# Patient Record
Sex: Female | Born: 1945
Health system: Southern US, Community
[De-identification: ages and names within clinical notes are randomized; demographics above are authoritative.]

## PROBLEM LIST (undated history)

## (undated) DIAGNOSIS — G20A1 Parkinson's disease without dyskinesia, without mention of fluctuations: Secondary | ICD-10-CM

## (undated) DIAGNOSIS — G20 Parkinson's disease: Secondary | ICD-10-CM

## (undated) DIAGNOSIS — E782 Mixed hyperlipidemia: Secondary | ICD-10-CM

## (undated) DIAGNOSIS — R32 Unspecified urinary incontinence: Secondary | ICD-10-CM

## (undated) DIAGNOSIS — F419 Anxiety disorder, unspecified: Secondary | ICD-10-CM

## (undated) HISTORY — PX: BREAST BIOPSY: SHX20

## (undated) HISTORY — DX: Unspecified urinary incontinence: R32

## (undated) HISTORY — PX: KNEE SURGERY: SHX244

## (undated) HISTORY — PX: BACK SURGERY: SHX140

## (undated) HISTORY — DX: Parkinson's disease without dyskinesia, without mention of fluctuations: G20.A1

## (undated) HISTORY — DX: Mixed hyperlipidemia: E78.2

## (undated) HISTORY — PX: VAGINAL HYSTERECTOMY: SUR661

## (undated) HISTORY — DX: Anxiety disorder, unspecified: F41.9

## (undated) HISTORY — DX: Parkinson's disease: G20

---

## 2019-07-20 ENCOUNTER — Other Ambulatory Visit: Payer: Self-pay

## 2019-07-20 ENCOUNTER — Encounter: Payer: Self-pay | Admitting: Obstetrics and Gynecology

## 2019-07-20 ENCOUNTER — Telehealth: Payer: Self-pay | Admitting: Obstetrics and Gynecology

## 2019-07-20 ENCOUNTER — Ambulatory Visit (INDEPENDENT_AMBULATORY_CARE_PROVIDER_SITE_OTHER): Payer: Medicare HMO | Admitting: Obstetrics and Gynecology

## 2019-07-20 ENCOUNTER — Other Ambulatory Visit: Payer: Self-pay | Admitting: *Deleted

## 2019-07-20 VITALS — BP 118/68 | HR 67 | Ht 63.0 in | Wt 120.5 lb

## 2019-07-20 DIAGNOSIS — Z4689 Encounter for fitting and adjustment of other specified devices: Secondary | ICD-10-CM | POA: Diagnosis not present

## 2019-07-20 MED ORDER — OXYQUINOLONE SULFATE 0.025 % VA GEL: 0.5000 | VAGINAL | 0 refills | Status: DC

## 2019-07-20 NOTE — Progress Notes (Signed)
Patient ID: Colleen Acevedo, female   DOB: 10/21/1946, 73 y.o.   MRN: [id number]  GYNECOLOGY CLINIC PROGRESS NOTE HPI: Recently moved from [facility]. Daughter work at [employer] living on [address].  Objective: The NEW GYN patient presented today for a pessary check as a referral from Dr. Nevada Crane. Patient has her own pessary. She reports no vaginal bleeding or discharge. She denies pelvic discomfort and difficulty urinating or moving her bowels. ?The patient's Ring pessary was removed, cleaned and replaced without complications. Speculum examination revealed normal vaginal mucosa with no lesions or lacerations. The patient should return in 3 months for a pessary check and continue to use vaginal estrogen cream weekly as prescribed.  Pelvic exam: VAGINA: slight irritation from pessary, lost of  desquaimated cells with heavy discharge., good support due to pessary. No appearance of yeast.  Uses trimosan vaginal gel weekly CERVIX: surgically absent UTERUS: surgically absent, vaginal cuff well healed.  A: 1. Pessary check  2. Medication management  P: 1. Refill of trimosan  By signing my name below, I, Samul Dada, attest that this documentation has been prepared under the direction and in the presence of Jonnie Kind, MD. Electronically Signed: [place]. 07/20/19. 11:34 AM.  I personally performed the services described in this documentation, which was SCRIBED in my presence. The recorded information has been reviewed and considered accurate. It has been edited as necessary during review. Jonnie Kind, MD

## 2019-07-20 NOTE — Telephone Encounter (Signed)
[facility] calling to see what exactly the dr is wanting the pt to use as they can not fine the OXYQUINOLONE SULFATE VAGINAL 0.025 % GEL By itself. Please advise.

## 2019-07-21 ENCOUNTER — Telehealth: Payer: Self-pay | Admitting: *Deleted

## 2019-07-21 NOTE — Telephone Encounter (Signed)
Dr Glo Herring called pharmacy to get medication clarified and changed.

## 2019-10-19 ENCOUNTER — Telehealth: Payer: Self-pay | Admitting: Obstetrics and Gynecology

## 2019-10-19 NOTE — Telephone Encounter (Signed)

## 2019-10-20 ENCOUNTER — Other Ambulatory Visit: Payer: Self-pay

## 2019-10-20 ENCOUNTER — Ambulatory Visit: Payer: Medicare HMO | Admitting: Obstetrics and Gynecology

## 2019-10-20 ENCOUNTER — Encounter: Payer: Self-pay | Admitting: Obstetrics and Gynecology

## 2019-10-20 VITALS — BP 113/61 | HR 72 | Ht 63.0 in | Wt 123.2 lb

## 2019-10-20 DIAGNOSIS — Z4689 Encounter for fitting and adjustment of other specified devices: Secondary | ICD-10-CM | POA: Diagnosis not present

## 2019-10-20 NOTE — Patient Instructions (Signed)
This is the type of pessary you are using.

## 2019-10-20 NOTE — Progress Notes (Signed)
    [facility] Visit  @DATE @            Patient name: Colleen Acevedo MRN [id number]  Date of birth: Sep 27, 1946  CC & HPI:  Algie Cales is a 73 y.o. female presenting today for follow-up 3 months after insertion of ring pessary.  She is using Premarin vaginal cream weekly using currently 2 g per application, will reduce to 1 g  ROS:  ROS   Pertinent History Reviewed:   Reviewed: Significant for denies bleeding or abnormal discharge.  She has had no difficulty voiding or defecation Medical         Past Medical History:  Diagnosis Date  . Anxiety disorder   . Mixed hyperlipidemia   . Parkinson's disease [facility])                               Surgical Hx:    Past Surgical History:  Procedure Laterality Date  . BACK SURGERY    . KNEE SURGERY    . VAGINAL HYSTERECTOMY     Medications: Reviewed & Updated - see associated section                       Current Outpatient Medications:  .  carbidopa-levodopa (SINEMET IR) 10-100 MG tablet, Takes 2 tabs QID, Disp: , Rfl:  .  sertraline (ZOLOFT) 25 MG tablet, Takes 2 tabs in the evening, Disp: , Rfl:  .  simvastatin (ZOCOR) 40 MG tablet, 40 mg daily. , Disp: , Rfl:    Social History: Reviewed -  reports that she has never smoked. She has never used smokeless tobacco.  Objective Findings:  Vitals: Blood pressure 113/61, pulse 72, height 5\' 3"  (1.6 m), weight 123 lb 3.2 oz (55.9 kg).  PHYSICAL EXAMINATION General appearance - alert, well appearing, and in no distress, oriented to person, place, and time and normal appearing weight Mental status - alert, oriented to person, place, and time Chest - clear to auscultation, no wheezes, rales or rhonchi, symmetric air entry Heart -  Abdomen -  Breasts -  Skin - normal coloration and turgor, no rashes, no suspicious skin lesions noted  PELVIC External genitalia - normal Vulva - estrogen effect , cream in place Vagina -no irritation, vaginal cream is present present but  there is no purulence mixed with it with Valsalva the pessary does not descend, appears to be giving her very effective support Cervix -   Uterus -   Adnexa -  Wet [place] -  Rectal -not indicated    Assessment & Plan:   A:  1. Excellent pessary function  P:  1. Continue Premarin vaginal cream 0.5 to 1 g weekly per vagina Recheck pessary 1 year, or as needed

## 2019-11-04 ENCOUNTER — Encounter: Payer: Self-pay | Admitting: Neurology

## 2019-11-04 ENCOUNTER — Ambulatory Visit (INDEPENDENT_AMBULATORY_CARE_PROVIDER_SITE_OTHER): Payer: Medicare HMO | Admitting: Neurology

## 2019-11-04 ENCOUNTER — Other Ambulatory Visit: Payer: Self-pay

## 2019-11-04 VITALS — BP 113/67 | HR 71 | Ht 63.0 in | Wt 121.0 lb

## 2019-11-04 DIAGNOSIS — G20 Parkinson's disease: Secondary | ICD-10-CM | POA: Diagnosis not present

## 2019-11-04 DIAGNOSIS — G20A1 Parkinson's disease without dyskinesia, without mention of fluctuations: Secondary | ICD-10-CM

## 2019-11-04 NOTE — Patient Instructions (Signed)
It was nice to meet you today.  I do not think we need to make any major changes to your medications but would recommend that you take your Sinemet 2 pills 4 times a day namely at 7 AM, 11 AM, 3 PM and 7 PM.  Please try to hydrate better with water, 3-4 bottles of water per day are recommended, 16 ounces each.  Please try to be proactive about constipation issues, use probiotics, a stool softener, or even an over the counter Laxative if needed.  Try to stay active mentally and physically.  Please remember that Sinemet is better absorbed if taken on an empty stomach.

## 2019-11-04 NOTE — Progress Notes (Signed)
Subjective:    Patient ID: Colleen Acevedo is a 73 y.o. female.  HPI     Star Age, MD, PhD [facility] [address], [address] [place], [place] [address]  Dear Colletta [place], I saw your patient, Colleen Acevedo, upon your kind request in my neurologic clinic today for initial consultation of her tremor disorder, concern for Parkinson's disease.  The patient is unaccompanied today.  As you know, Colleen Acevedo is a 73 year old right-handed woman with an underlying medical history of hyperlipidemia, anxiety, who was diagnosed with Parkinson's disease in 2016, while she was still residing in [place].  She has moved to [facility].  She was followed by a neurologist.  I was able to review some of her neurology records from [facility] in [place], [place].  I reviewed the office note from 03/17/2018, at which time the patient was deemed stable as far as her Parkinson's disease and was advised to continue with Sinemet 2 pills 3 times daily.  She was advised to increase it if needed to 2 pills 4 times a day.  I reviewed your office note from 08/11/2019, which you kindly included. She has been on Sinemet since 2016. She had blood work through your office on 08/06/2019 and I reviewed the results, CBC with differential and platelets was unremarkable, CMP showed benign findings, lipid panel showed total cholesterol of 155, triglycerides 82, LDL 16. They moved at the end of June, she is originally from [place], they have a daughter in [place] and one in [place] and they moved to be closer to her.  Daughter lives next door and they have 2 grandchildren from her and also great-grandchildren.  The patient is a non-smoker and does not utilize alcohol and drinks caffeine and limitation, 1 cup of coffee in the morning typically.  She has not had any recent falls thankfully, she has had some intermittent issues with constipation, takes a probiotic and sometimes a  laxative.  She admits that she does not drink a whole lot of water, estimates that she drinks about 1 bottle of water per day on average.  She is physically quite active, no issues with memory, mood is stable.  She has no family history of Parkinson's disease. Her symptoms started towards the end of 2015, may be in December with a right hand tremor and feeling nervous.  She has not had a brain scan such as CT or MRI.She has been followed by Dr. Carleene Cooper. She currently takes Sinemet 2 pills at 7 AM, 3 pills at 12 and 2 pills at 8 PM.  She does take the medication often with her meals.  Her Past Medical History Is Significant For: Past Medical History:  Diagnosis Date  . Anxiety disorder   . Mixed hyperlipidemia   . Parkinson's disease ([place])     Her Past Surgical History Is Significant For: Past Surgical History:  Procedure Laterality Date  . BACK SURGERY    . KNEE SURGERY    . VAGINAL HYSTERECTOMY      Her Family History Is Significant For: Family History  Problem Relation Age of Onset  . Colon cancer Father   . Diabetes Mother   . Heart attack Brother   . Colon cancer Sister   . Breast cancer Sister   . Heart attack Brother   . Thyroid disease Daughter     Her Social History Is Significant For: Social History   Socioeconomic History  . Marital status: Married  Spouse name: Not on file  . Number of children: Not on file  . Years of education: Not on file  . Highest education level: Not on file  Occupational History  . Not on file  Tobacco Use  . Smoking status: Never Smoker  . Smokeless tobacco: Never Used  Substance and Sexual Activity  . Alcohol use: Never  . Drug use: Never  . Sexual activity: Not Currently    Birth control/protection: Surgical    Comment: hyst  Other Topics Concern  . Not on file  Social History Narrative  . Not on file   Social Determinants of Health   Financial Resource Strain:   . Difficulty of Paying Living Expenses: Not on  file  Food Insecurity:   . Worried About Programme researcher, broadcasting/film/video in the Last Year: Not on file  . Ran Out of Food in the Last Year: Not on file  Transportation Needs:   . Lack of Transportation (Medical): Not on file  . Lack of Transportation (Non-Medical): Not on file  Physical Activity:   . Days of Exercise per Week: Not on file  . Minutes of Exercise per Session: Not on file  Stress:   . Feeling of Stress : Not on file  Social Connections:   . Frequency of Communication with Friends and Family: Not on file  . Frequency of Social Gatherings with Friends and Family: Not on file  . Attends Religious Services: Not on file  . Active Member of Clubs or Organizations: Not on file  . Attends Banker Meetings: Not on file  . Marital Status: Not on file    Her Allergies Are:  Allergies  Allergen Reactions  . Amoxicillin Itching and Rash  :   Her Current Medications Are:  Outpatient Encounter Medications as of 11/04/2019  Medication Sig  . carbidopa-levodopa (SINEMET IR) 25-100 MG tablet Take by mouth. Takes 2 tabs in the morning, 3 tabs at noon, 2 tabs in PM  . sertraline (ZOLOFT) 25 MG tablet Take 50 mg by mouth daily.  . simvastatin (ZOCOR) 40 MG tablet 40 mg daily.   . [DISCONTINUED] carbidopa-levodopa (SINEMET IR) 10-100 MG tablet Takes 2 tabs QID  . [DISCONTINUED] sertraline (ZOLOFT) 25 MG tablet Takes 2 tabs in the evening   No facility-administered encounter medications on file as of 11/04/2019.  :   Review of Systems:  Out of a complete 14 point review of systems, all are reviewed and negative with the exception of these symptoms as listed below:  Review of Systems  Neurological:       Pt presents today to discuss her PD. She was diagnosed in [place] but has moved to [facility]. Pt is right handed.    Objective:  Neurological Exam  Physical Exam Physical Examination:   Vitals:   11/04/19 1423  BP: 113/67  Pulse: 71   General Examination: The patient is a very  pleasant 73 y.o. female in no acute distress. She appears well-developed and well groomed.   HEENT: Normocephalic, atraumatic, pupils are equal, round and reactive to light and accommodation. Extraocular tracking is mildly impaired, she wears corrective eyeglasses, she has mild facial masking minimal to mild nuchal rigidity is noted, no lip, neck or jaw tremor, airway examination reveals mild mouth dryness, tongue protrudes centrally and palate elevates symmetrically, she has mild hypophonia.  Chest: Clear to auscultation without wheezing, rhonchi or crackles noted.  Heart: S1+S2+0, regular and normal without murmurs, rubs or gallops noted.   Abdomen:  Soft, non-tender and non-distended with normal bowel sounds appreciated on auscultation.  Extremities: There is no pitting edema in the distal lower extremities bilaterally.   Skin: Warm and dry without trophic changes noted.  Musculoskeletal: exam reveals no obvious joint deformities, tenderness or joint swelling or erythema.   Neurologically:  Mental status: The patient is awake, alert and oriented in all 4 spheres. Her immediate and remote memory, attention, language skills and fund of knowledge are appropriate. There is no evidence of aphasia, agnosia, apraxia or anomia. Speech is clear with normal prosody and enunciation. Thought process is linear. Mood is normal and affect is normal.  Cranial nerves II - XII are as described above under HEENT exam. In addition: shoulder shrug is normal with equal shoulder height noted.  On 11/04/2019: On Archimedes spiral drawing she has no tremor with the right hand, fairly good spiral, left hand is insecure, handwriting is legible, not particularly tremulous, slightly on the smaller side.  Motor exam: Overall, thin built, global strength normal for age, no one-sided weakness noted, mild intermittent right upper extremity tremor noted.  She has slight increase in tone with cogwheeling noted in the right  upper extremity, otherwise good tone, reflexes are 2+ throughout, toes are downgoing.  Fine motor skills with finger taps and foot agility, she has mild impairment on the right side, fairly normal findings on the left.  Sensory exam is intact to light touch.  Cerebellar testing shows no dysmetria or intention tremor, no ataxia, finger-to-nose is normal. Gait, station and balance: She stands Without major difficulty, posture is slightly stooped for age, she walks with decreased stride length and decreased pace, decreased arm swing bilaterally.  Turns well, balance is well preserved.  Assessment and Plan:   In summary, Colleen Acevedo is a very pleasant 73 y.o.-year old female with an underlying medical history of hyperlipidemia, and anxiety, who Presents for evaluation of her parkinsonism, to establish care [place].  Her history and examination are in keeping with mild right-sided parkinsonism, possibly right-sided predominant Parkinson's disease.  She has responded to levodopa therapy and is currently taking 2 pills in the morning, 3 at midday and 2 in the evening.  I do not believe we need to change her regimen drastically but I would recommend that she consider taking 2 pills 4 times a day on a scheduled basis, every 4 hours starting at 7 AM.  She is advised to take her medication without her meals to ensure better absorption.To that end, I suggested she take 2 pills at 7 AM, 2 at 11 AM, 2 at 3 PM and 2 at 7 PM.  She is advised to stay well-hydrated with water and exercise on a regular basis.  She is quite active mentally and physically thankfully.  She has not fallen.  She does have intermittent Issues with constipation and is reminded to be proactive about it.  I recommended a follow-up routinely in 4 months, sooner if needed.  She did not need a prescription refill today.  I answered all their questions today and the patient and her husband were in agreement Thank you very much  for allowing me to participate in the care of this nice patient. If I can be of any further assistance to you please do not hesitate to call me at [phone].  Sincerely,   Huston FoleySaima Lakisa Lotz, MD, PhD

## 2019-12-09 ENCOUNTER — Other Ambulatory Visit (HOSPITAL_COMMUNITY): Payer: Self-pay | Admitting: Internal Medicine

## 2019-12-09 DIAGNOSIS — Z1231 Encounter for screening mammogram for malignant neoplasm of breast: Secondary | ICD-10-CM

## 2020-01-14 ENCOUNTER — Encounter (HOSPITAL_COMMUNITY): Payer: Self-pay

## 2020-01-14 ENCOUNTER — Other Ambulatory Visit: Payer: Self-pay

## 2020-01-14 ENCOUNTER — Ambulatory Visit (HOSPITAL_COMMUNITY)
Admission: RE | Admit: 2020-01-14 | Discharge: 2020-01-14 | Disposition: A | Discharge status: Home / Self Care | Payer: Medicare HMO | Source: Ambulatory Visit | Attending: Internal Medicine | Admitting: Internal Medicine

## 2020-01-14 DIAGNOSIS — Z1231 Encounter for screening mammogram for malignant neoplasm of breast: Secondary | ICD-10-CM | POA: Insufficient documentation

## 2020-01-25 ENCOUNTER — Other Ambulatory Visit (HOSPITAL_COMMUNITY): Payer: Self-pay | Admitting: Internal Medicine

## 2020-01-25 ENCOUNTER — Inpatient Hospital Stay
Admission: RE | Admit: 2020-01-25 | Discharge: 2020-01-25 | Disposition: A | Discharge status: Home / Self Care | Payer: Self-pay | Source: Ambulatory Visit | Attending: Internal Medicine | Admitting: Internal Medicine

## 2020-01-25 DIAGNOSIS — Z1231 Encounter for screening mammogram for malignant neoplasm of breast: Secondary | ICD-10-CM

## 2020-03-06 ENCOUNTER — Other Ambulatory Visit: Payer: Self-pay

## 2020-03-06 ENCOUNTER — Ambulatory Visit: Payer: Medicare HMO | Admitting: Neurology

## 2020-03-06 ENCOUNTER — Encounter: Payer: Self-pay | Admitting: Neurology

## 2020-03-06 VITALS — BP 111/64 | HR 72 | Ht 62.0 in | Wt 119.0 lb

## 2020-03-06 DIAGNOSIS — G20 Parkinson's disease: Secondary | ICD-10-CM | POA: Diagnosis not present

## 2020-03-06 MED ORDER — CARBIDOPA-LEVODOPA 25-100 MG PO TABS: 2.0000 | ORAL_TABLET | Freq: Four times a day (QID) | ORAL | 3 refills | Status: DC

## 2020-03-06 NOTE — Patient Instructions (Signed)
Fall risk is real! Please remember to stand up slowly and get your bearings first turn slowly, no bending down to pick anything, no heavy lifting, be extra careful at night and first thing in the morning. Also, be careful in the Bathroom and the kitchen.  Please do not carry any child or pet. Do not climb ladders or step stools.   Talk to Dr. Margo Aye about your unintentional weight loss and work up for an underlying medical condition. If you do not have an appointment coming up soon, this month or next month, please make an appointment to see him.   We will keep your Sinemet the same.  Please follow up in 6 months.

## 2020-03-06 NOTE — Progress Notes (Signed)
Subjective:    Patient ID: Colleen Acevedo is a 74 y.o. female.  HPI     Interim history:   Colleen Acevedo is a 74 year old right-handed woman with an underlying medical history of hyperlipidemia, anxiety, who presents for Follow-up consultation of her Parkinson's disease.  The patient is unaccompanied today.  I first met her on 11/04/2019, at which time she reported a diagnosis of Parkinson's disease since 2016.  She was on Sinemet, 2 pills in the morning, 3 midday and 2 in the evening.  She was advised change her regimen to 2 pills 4 times a day on a scheduled 4 hourly basis.  Today, 03/06/2020: She reports that Sinemet 2 pills 4 times a day is helpful.  She has been keeping it on a 4 hourly schedule, starting at 7:30 AM.  Her husband adds that he is worried about her weight loss, in the past 4+ years she has lost a significant amount of weight, some 4 years ago she weighed 165 pounds, current weight is 119 pounds.  She endorses good appetite.  She had some blood work through her primary care physician but no further work-up for weight loss.  She denies any significant anxiety or depression.  She does stay very active and her husband is worried that she overdoes it sometimes.  She has fallen 2 times since our first visit in December.  She fell once down some stairs, she was carrying her granddaughter, thankfully, neither 1 were significantly hard.  The patient had a bruise on the right hip.  She also fell backwards and tripped over her daughter's dog once.  She did not hurt herself, hit her head or lost consciousness.  Her husband also reports that she uses a stepladder and he is worried about her using a stepstool or stepladder.  She has had intermittent constipation but takes probiotic for this.  She tries to hydrate well but admits that she could do better, she averages about 2 bottles of water per day.  The patient's allergies, current medications, family history, past medical history, past social  history, past surgical history and problem list were reviewed and updated as appropriate.   Previously:   11/04/19: (She) was diagnosed with Parkinson's disease in 2016, while she was still residing in [place].  She has moved to [facility].  She was followed by a neurologist.  I was able to review some of her neurology records from [facility] in [place], [place].  I reviewed the office note from 03/17/2018, at which time the patient was deemed stable as far as her Parkinson's disease and was advised to continue with Sinemet 2 pills 3 times daily.  She was advised to increase it if needed to 2 pills 4 times a day.  I reviewed your office note from 08/11/2019, which you kindly included. She has been on Sinemet since 2016. She had blood work through your office on 08/06/2019 and I reviewed the results, CBC with differential and platelets was unremarkable, CMP showed benign findings, lipid panel showed total cholesterol of 155, triglycerides 82, LDL 16. They moved at the end of June, she is originally from [place], they have a daughter in [place] and one in [place] and they moved to be closer to her.  Daughter lives next door and they have 2 grandchildren from her and also great-grandchildren.  The patient is a non-smoker and does not utilize alcohol and drinks caffeine and limitation, 1 cup of coffee in the morning typically.  She  has not had any recent falls thankfully, she has had some intermittent issues with constipation, takes a probiotic and sometimes a laxative.  She admits that she does not drink a whole lot of water, estimates that she drinks about 1 bottle of water per day on average.  She is physically quite active, no issues with memory, mood is stable.  She has no family history of Parkinson's disease. Her symptoms started towards the end of 2015, may be in December with a right hand tremor and feeling nervous.  She has not had a brain scan such as CT or MRI.She has been  followed by Dr. Carleene Cooper. She currently takes Sinemet 2 pills at 7 AM, 3 pills at 12 and 2 pills at 8 PM.  She does take the medication often with her meals.  Her Past Medical History Is Significant For: Past Medical History:  Diagnosis Date  . Anxiety disorder   . Mixed hyperlipidemia   . Parkinson's disease ([place])     Her Past Surgical History Is Significant For: Past Surgical History:  Procedure Laterality Date  . BACK SURGERY    . BREAST BIOPSY Right   . KNEE SURGERY    . VAGINAL HYSTERECTOMY      Her Family History Is Significant For: Family History  Problem Relation Age of Onset  . Colon cancer Father   . Diabetes Mother   . Heart attack Brother   . Colon cancer Sister   . Breast cancer Sister   . Heart attack Brother   . Thyroid disease Daughter     Her Social History Is Significant For: Social History   Socioeconomic History  . Marital status: Married    Spouse name: Not on file  . Number of children: Not on file  . Years of education: Not on file  . Highest education level: Not on file  Occupational History  . Not on file  Tobacco Use  . Smoking status: Never Smoker  . Smokeless tobacco: Never Used  Substance and Sexual Activity  . Alcohol use: Never  . Drug use: Never  . Sexual activity: Not Currently    Birth control/protection: Surgical    Comment: hyst  Other Topics Concern  . Not on file  Social History Narrative  . Not on file   Social Determinants of Health   Financial Resource Strain:   . Difficulty of Paying Living Expenses:   Food Insecurity:   . Worried About Charity fundraiser in the Last Year:   . Arboriculturist in the Last Year:   Transportation Needs:   . Film/video editor (Medical):   Marland Kitchen Lack of Transportation (Non-Medical):   Physical Activity:   . Days of Exercise per Week:   . Minutes of Exercise per Session:   Stress:   . Feeling of Stress :   Social Connections:   . Frequency of Communication with  Friends and Family:   . Frequency of Social Gatherings with Friends and Family:   . Attends Religious Services:   . Active Member of Clubs or Organizations:   . Attends Archivist Meetings:   Marland Kitchen Marital Status:     Her Allergies Are:  Allergies  Allergen Reactions  . Amoxicillin Itching and Rash  :   Her Current Medications Are:  Outpatient Encounter Medications as of 03/06/2020  Medication Sig  . carbidopa-levodopa (SINEMET IR) 25-100 MG tablet Take 2 tablets by mouth 4 (four) times daily.  . sertraline (ZOLOFT)  25 MG tablet Take 50 mg by mouth daily.  . simvastatin (ZOCOR) 40 MG tablet 40 mg daily.   . [DISCONTINUED] carbidopa-levodopa (SINEMET IR) 25-100 MG tablet Take by mouth. Takes 2 tabs in the morning, 3 tabs at noon, 2 tabs in PM   No facility-administered encounter medications on file as of 03/06/2020.  :  Review of Systems:  Out of a complete 14 point review of systems, all are reviewed and negative with the exception of these symptoms as listed below: Review of Systems  Neurological:       Pt presents today to discuss her PD. Pt increased her C/L to 2 tablets QID and is doing well. Her husband is concerned about her unintentional weight loss. He reports that she was once 165 lbs and now is 119 lbs.    Objective:  Neurological Exam  Physical Exam Physical Examination:   Vitals:   03/06/20 1024  BP: 111/64  Pulse: 72    General Examination: The patient is a very pleasant 74 y.o. female in no acute distress. She appears well-developed and well-nourished and well groomed.   HEENT: Normocephalic, atraumatic, pupils are equal, round and reactive to light and accommodation. Extraocular tracking is mildly impaired, she wears corrective eyeglasses, she has mild facial masking minimal to mild nuchal rigidity is noted, no lip, neck or jaw tremor, airway examination reveals mild mouth dryness, tongue protrudes centrally and palate elevates symmetrically, she has  mild hypophonia. No sialorrhea.  Chest: Clear to auscultation without wheezing, rhonchi or crackles noted.  Heart: S1+S2+0, regular and normal without murmurs, rubs or gallops noted.   Abdomen: Soft, non-tender and non-distended with normal bowel sounds appreciated on auscultation.  Extremities: There is no pitting edema in the distal lower extremities bilaterally.   Skin: Warm and dry without trophic changes noted.  Musculoskeletal: exam reveals no obvious joint deformities, tenderness or joint swelling or erythema.   Neurologically:  Mental status: The patient is awake, alert and oriented in all 4 spheres. Her immediate and remote memory, attention, language skills and fund of knowledge are appropriate. There is no evidence of aphasia, agnosia, apraxia or anomia. Speech is clear with normal prosody and enunciation. Thought process is linear. Mood is normal and affect is normal.  Cranial nerves II - XII are as described above under HEENT exam. In addition: shoulder shrug is normal with equal shoulder height noted.  (On 11/04/2019: On Archimedes spiral drawing she has no tremor with the right hand, fairly good spiral, left hand is insecure, handwriting is legible, not particularly tremulous, slightly on the smaller side.)  Motor exam: Overall, thin built, global strength normal for age, no one-sided weakness noted, mild intermittent right upper extremity tremor noted at rest.  She has slight increase in tone with cogwheeling noted in the right upper extremity, otherwise good tone, reflexes are 2+ throughout, toes are downgoing.  Fine motor skills with finger taps and foot agility, she has mild impairment on the right side, slightly abnormal findings on the left.  Sensory exam is intact to light touch.  Cerebellar testing shows no dysmetria or intention tremor, no ataxia, finger-to-nose is normal. Gait, station and balance: She stands Without major difficulty, posture is slightly stooped  for age, she walks with decreased stride length and decreased pace, decreased arm swing bilaterally, more so on the R.  Turns well, balance is fairly well preserved.  Assessment and Plan:   In summary, Colleen Acevedo is a very pleasant 74 year old female with an underlying  medical history of hyperlipidemia, and anxiety, who presents for e follow-up consultation of her Parkinson's disease with right-sided predominance.  She has responded to levodopa therapy and is currently taking 2 pills 4 times a day on a 4 hourly schedule, starting at 7:30 AM.  We talked about fall risk and the importance of fall prevention.  I do not believe that she needs to start using a cane or walker at this time but she needs to be more cautious.  She is advised not to carry anything heavy especially not a child.  She is advised not to carry a pets, she is advised to use the handrail when she is on the stairs and never to carry anything up and down the stairs, she is furthermore discouraged from using any stepstool or stepladder.  We talked about the importance of good hydration and nutrition.  She is advised to monitor her weight but also make a follow-up appointment with her primary care physician regarding her unintentional weight loss over time.  She endorses good appetite.  She may need further medical work-up for weight loss including imaging tests or scans or endoscopic testing.  She had some blood work in the recent past she reports.   She has intermittent Issues with constipation and is reminded to be proactive about it.  I recommended a follow-up routinely in about 6 months, sooner if needed. I answered all their questions today and the patient and her husband were in agreement.

## 2020-06-15 ENCOUNTER — Ambulatory Visit: Payer: Medicare HMO | Admitting: Obstetrics and Gynecology

## 2020-06-26 ENCOUNTER — Ambulatory Visit: Payer: Medicare HMO | Admitting: Obstetrics & Gynecology

## 2020-06-26 ENCOUNTER — Encounter: Payer: Self-pay | Admitting: Obstetrics & Gynecology

## 2020-06-26 VITALS — BP 117/69 | HR 73 | Temp 98.1°F | Ht 62.0 in | Wt 123.2 lb

## 2020-06-26 DIAGNOSIS — R3911 Hesitancy of micturition: Secondary | ICD-10-CM | POA: Diagnosis not present

## 2020-06-26 DIAGNOSIS — R300 Dysuria: Secondary | ICD-10-CM

## 2020-06-26 DIAGNOSIS — R8290 Unspecified abnormal findings in urine: Secondary | ICD-10-CM

## 2020-06-26 DIAGNOSIS — Z4689 Encounter for fitting and adjustment of other specified devices: Secondary | ICD-10-CM | POA: Diagnosis not present

## 2020-06-26 LAB — POCT URINALYSIS DIPSTICK
Blood, UA: NEGATIVE
Glucose, UA: NEGATIVE
Ketones, UA: NEGATIVE
Leukocytes, UA: NEGATIVE
Nitrite, UA: NEGATIVE
Protein, UA: NEGATIVE

## 2020-06-26 NOTE — Addendum Note (Signed)
Addended by: Moss Mc on: 06/26/2020 04:33 PM   Modules accepted: Orders

## 2020-06-26 NOTE — Progress Notes (Signed)
Chief Complaint  Patient presents with  . Pessary Check    possibly uti- can't pee    Blood pressure 117/69, pulse 73, temperature 98.1 F (36.7 C), height 5\' 2"  (1.575 m), weight 123 lb 3.2 oz (55.9 kg).  Colleen Acevedo presents today for routine follow up related to her pessary.   She uses a Milex ring with support #2 She reports no vaginal discharge or vaginal bleeding.  Exam reveals no undue vaginal mucosal pressure of breakdown, no discharge and no vaginal bleeding.  The pessary is removed, cleaned and replaced without difficulty.      ICD-10-CM   1. Pessary maintenance, Milex ring with support #2(placed In [place], [place] years ago)  Z46.89    normal exam, normal cycle of removal and cleaning every 4 months     Malayzia Laforte will be sen back in 4 months for continued follow up.  [name], MD  06/26/2020 3:42 PM

## 2020-06-28 ENCOUNTER — Telehealth: Payer: Self-pay | Admitting: Obstetrics & Gynecology

## 2020-06-28 LAB — URINE CULTURE

## 2020-06-28 MED ORDER — SULFAMETHOXAZOLE-TRIMETHOPRIM 800-160 MG PO TABS: 1.0000 | ORAL_TABLET | Freq: Two times a day (BID) | ORAL | 0 refills | Status: DC

## 2020-07-03 ENCOUNTER — Telehealth: Payer: Self-pay | Admitting: *Deleted

## 2020-07-03 NOTE — Telephone Encounter (Signed)
Patient made aware antibiotics have been sent in for +UTI.  Advised to take all of the medication.  Pt unaware but will go pick up prescription today.

## 2020-08-03 DIAGNOSIS — Z0189 Encounter for other specified special examinations: Secondary | ICD-10-CM | POA: Diagnosis not present

## 2020-08-03 DIAGNOSIS — F411 Generalized anxiety disorder: Secondary | ICD-10-CM | POA: Diagnosis not present

## 2020-08-03 DIAGNOSIS — G20 Parkinson's disease: Secondary | ICD-10-CM | POA: Diagnosis not present

## 2020-08-03 DIAGNOSIS — E782 Mixed hyperlipidemia: Secondary | ICD-10-CM | POA: Diagnosis not present

## 2020-08-03 DIAGNOSIS — Z712 Person consulting for explanation of examination or test findings: Secondary | ICD-10-CM | POA: Diagnosis not present

## 2020-08-03 DIAGNOSIS — Z0001 Encounter for general adult medical examination with abnormal findings: Secondary | ICD-10-CM | POA: Diagnosis not present

## 2020-08-08 DIAGNOSIS — G20 Parkinson's disease: Secondary | ICD-10-CM | POA: Diagnosis not present

## 2020-08-08 DIAGNOSIS — Z0001 Encounter for general adult medical examination with abnormal findings: Secondary | ICD-10-CM | POA: Diagnosis not present

## 2020-08-08 DIAGNOSIS — G9009 Other idiopathic peripheral autonomic neuropathy: Secondary | ICD-10-CM | POA: Diagnosis not present

## 2020-08-08 DIAGNOSIS — F411 Generalized anxiety disorder: Secondary | ICD-10-CM | POA: Diagnosis not present

## 2020-08-08 DIAGNOSIS — N390 Urinary tract infection, site not specified: Secondary | ICD-10-CM | POA: Diagnosis not present

## 2020-08-08 DIAGNOSIS — E782 Mixed hyperlipidemia: Secondary | ICD-10-CM | POA: Diagnosis not present

## 2020-08-08 DIAGNOSIS — Z712 Person consulting for explanation of examination or test findings: Secondary | ICD-10-CM | POA: Diagnosis not present

## 2020-08-08 DIAGNOSIS — Z0189 Encounter for other specified special examinations: Secondary | ICD-10-CM | POA: Diagnosis not present

## 2020-08-14 ENCOUNTER — Other Ambulatory Visit (HOSPITAL_COMMUNITY): Payer: Self-pay | Admitting: Internal Medicine

## 2020-08-14 DIAGNOSIS — Z1231 Encounter for screening mammogram for malignant neoplasm of breast: Secondary | ICD-10-CM

## 2020-09-05 ENCOUNTER — Encounter: Payer: Self-pay | Admitting: Neurology

## 2020-09-05 ENCOUNTER — Ambulatory Visit: Payer: Medicare HMO | Admitting: Neurology

## 2020-09-05 VITALS — BP 112/62 | HR 73 | Ht 62.0 in | Wt 122.0 lb

## 2020-09-05 DIAGNOSIS — G20 Parkinson's disease: Secondary | ICD-10-CM | POA: Diagnosis not present

## 2020-09-05 NOTE — Patient Instructions (Signed)
I believe you have been stable.  We will keep an eye on your memory function and please monitor your driving skills.  I do think you need to establish care with an ophthalmologist since you have not seen an eye doctor in over a year, since you moved from [place].  You have cataracts and your left eyelid is droopy, left pupil also a little larger than the right. You may benefit from cataract surgery at some point in the near future.  We will continue with your Sinemet 2 pills 4 times a day.  Your prescription is up-to-date.  Please continue to stay active mentally and physically.  Please monitor for constipation issues.  Follow-up in 6 months, sooner if needed.  You can call us anytime for any interim questions or concerns. It was good to see you both again today.

## 2020-09-05 NOTE — Progress Notes (Signed)
Subjective:    Patient ID: Colleen Acevedo is a 74 y.o. female.  HPI     Interim history:   Colleen Acevedo is a 74 year old right-handed woman with an underlying medical history of hyperlipidemia, anxiety, who presents for Follow-up consultation of her Parkinson's disease.  The patient is accompanied by her husband today.  I last saw her on 03/06/2020, at which time she was taking Sinemet 2 pills 4 times daily.  Her husband was worried about her unintentional weight loss.  This was ongoing over a few years.  She was advised to talk to her primary care physician about additional work-up for weight loss.  We talked about fall prevention.  She had fallen twice since her previous visit.  She was advised to hydrate better and to be proactive about constipation issues.  She was advised to continue with Sinemet at the current dose.  Today, 09/05/2020: She reports doing quite well.  She is quite active physically and mentally.  They like to play cards and she is preparing to bake sweet potato pies for a fundraiser later this month.  Her weight has been stable, appetite fairly good, she does take Ensure twice daily.  She has not had any recent falls.  She has had some mild forgetfulness and has misplaced her keys.  One time she did have some confusion disorientation while driving.  She was driving to a known location but got lost for a little bit.  This had not happened before.  She has had some intermittent tingling in her feet for the past for 5 months and was started by her primary care physician on gabapentin.  She reports taking 100 mg at bedtime for the past 2 months and is not sure if it has helped yet.  She has not had an eye examination since they moved here from [place].  She was followed for cataracts.  She is feeling stable motor wise and takes her Sinemet 2 pills 4 times a day, starting at 7:30 AM typically.  She does endorse a family history of dementia, sister has Alzheimer's dementia, she is 52  years old.    The patient's allergies, current medications, family history, past medical history, past social history, past surgical history and problem list were reviewed and updated as appropriate.    Previously:    I first met her on 11/04/2019, at which time she reported a diagnosis of Parkinson's disease since 2016.  She was on Sinemet, 2 pills in the morning, 3 midday and 2 in the evening.  She was advised change her regimen to 2 pills 4 times a day on a scheduled 4 hourly basis.     11/04/19: (She) was diagnosed with Parkinson's disease in 2016, while she was still residing in [place].  She has moved to [facility].  She was followed by a neurologist.  I was able to review some of her neurology records from [facility] in [place], [place].  I reviewed the office note from 03/17/2018, at which time the patient was deemed stable as far as her Parkinson's disease and was advised to continue with Sinemet 2 pills 3 times daily.  She was advised to increase it if needed to 2 pills 4 times a day.  I reviewed your office note from 08/11/2019, which you kindly included. She has been on Sinemet since 2016. She had blood work through your office on 08/06/2019 and I reviewed the results, CBC with differential and platelets was unremarkable, CMP showed benign  findings, lipid panel showed total cholesterol of 155, triglycerides 82, LDL 16. They moved at the end of June, she is originally from [place], they have a daughter in [place] and one in [place] and they moved to be closer to her.  Daughter lives next door and they have 2 grandchildren from her and also great-grandchildren.  The patient is a non-smoker and does not utilize alcohol and drinks caffeine and limitation, 1 cup of coffee in the morning typically.  She has not had any recent falls thankfully, she has had some intermittent issues with constipation, takes a probiotic and sometimes a laxative.  She admits that she does not drink  a whole lot of water, estimates that she drinks about 1 bottle of water per day on average.  She is physically quite active, no issues with memory, mood is stable.  She has no family history of Parkinson's disease. Her symptoms started towards the end of 2015, may be in December with a right hand tremor and feeling nervous.  She has not had a brain scan such as CT or MRI.She has been followed by Dr. Carleene Cooper. She currently takes Sinemet 2 pills at 7 AM, 3 pills at 12 and 2 pills at 8 PM.  She does take the medication often with her meals.  Her Past Medical History Is Significant For: Past Medical History:  Diagnosis Date  . Anxiety disorder   . Mixed hyperlipidemia   . Parkinson's disease ([place])     Her Past Surgical History Is Significant For: Past Surgical History:  Procedure Laterality Date  . BACK SURGERY    . BREAST BIOPSY Right   . KNEE SURGERY    . VAGINAL HYSTERECTOMY      Her Family History Is Significant For: Family History  Problem Relation Age of Onset  . Colon cancer Father   . Diabetes Mother   . Heart attack Brother   . Colon cancer Sister   . Breast cancer Sister   . Heart attack Brother   . Thyroid disease Daughter     Her Social History Is Significant For: Social History   Socioeconomic History  . Marital status: Married    Spouse name: Not on file  . Number of children: 2  . Years of education: Not on file  . Highest education level: Not on file  Occupational History  . Not on file  Tobacco Use  . Smoking status: Never Smoker  . Smokeless tobacco: Never Used  Vaping Use  . Vaping Use: Never used  Substance and Sexual Activity  . Alcohol use: Never  . Drug use: Never  . Sexual activity: Not Currently    Birth control/protection: Surgical    Comment: hyst  Other Topics Concern  . Not on file  Social History Narrative  . Not on file   Social Determinants of Health   Financial Resource Strain:   . Difficulty of Paying Living  Expenses: Not on file  Food Insecurity:   . Worried About Charity fundraiser in the Last Year: Not on file  . Ran Out of Food in the Last Year: Not on file  Transportation Needs:   . Lack of Transportation (Medical): Not on file  . Lack of Transportation (Non-Medical): Not on file  Physical Activity:   . Days of Exercise per Week: Not on file  . Minutes of Exercise per Session: Not on file  Stress:   . Feeling of Stress : Not on file  Social  Connections:   . Frequency of Communication with Friends and Family: Not on file  . Frequency of Social Gatherings with Friends and Family: Not on file  . Attends Religious Services: Not on file  . Active Member of Clubs or Organizations: Not on file  . Attends Archivist Meetings: Not on file  . Marital Status: Not on file    Her Allergies Are:  Allergies  Allergen Reactions  . Amoxicillin Itching and Rash  :   Her Current Medications Are:  Outpatient Encounter Medications as of 09/05/2020  Medication Sig  . calcium carbonate (OSCAL) 1500 (600 Ca) MG TABS tablet Take by mouth 2 (two) times daily with a meal.  . Calcium Carbonate-Vitamin D (CALCIUM 500 + D) 500-125 MG-UNIT TABS Take by mouth.  . carbidopa-levodopa (SINEMET IR) 25-100 MG tablet Take 2 tablets by mouth 4 (four) times daily. (Patient taking differently: Take by mouth 4 (four) times daily. )  . Chromium Picolinate 500 MCG CAPS Take by mouth.  . CRANBERRY PO Take by mouth.  . loratadine (CLARITIN) 10 MG tablet Take 10 mg by mouth daily.  Marland Kitchen OVER THE COUNTER MEDICATION Velbet El energy antler 250 mg  . Oxyquinoline-Sod Lauryl Sulf (TRIMO-SAN) 0.025-0.01 % GEL Place vaginally.  . sertraline (ZOLOFT) 25 MG tablet Take 50 mg by mouth daily.  . simvastatin (ZOCOR) 40 MG tablet 40 mg daily.   Marland Kitchen sulfamethoxazole-trimethoprim (BACTRIM DS) 800-160 MG tablet Take 1 tablet by mouth 2 (two) times daily.  . vitamin B-12 (CYANOCOBALAMIN) 100 MCG tablet Take 100 mcg by mouth daily.   . [DISCONTINUED] Omega-3 Fatty Acids (FISH OIL) 1200 MG CAPS Take by mouth. (Patient not taking: Reported on 09/05/2020)   No facility-administered encounter medications on file as of 09/05/2020.  :  Review of Systems:  Out of a complete 14 point review of systems, all are reviewed and negative with the exception of these symptoms as listed below:  Review of Systems  Neurological:       Here for f/u on PD. Pt reports she has been doing well since her last visit. No falls and no new concerns/issues to report.     Objective:  Neurological Exam  Physical Exam Physical Examination:   Vitals:   09/05/20 1018  BP: 112/62  Pulse: 73  SpO2: 97%    General Examination: The patient is a very pleasant 74 y.o. female in no acute distress. She appears well-developed and well-nourished and well groomed.   HEENT:Normocephalic, atraumatic, pupils are round and reactive to light, extraocular tracking is mildly impaired, she wears corrective eyeglasses.  She has bilateral cataracts.  She has mild left ptosis and left pupil is a little larger than the right.  She has had the droopy eyelid before but I not sure she had the pupillary discrepancy before. She has mild facial masking minimal to mild nuchal rigidity is noted, no lip, neck or jaw tremor, airway examination reveals mild mouth dryness, tongue protrudes centrally and palate elevates symmetrically, she has mild hypophonia. No sialorrhea.  Chest:Clear to auscultation without wheezing, rhonchi or crackles noted.  Heart:S1+S2+0, regular and normal without murmurs, rubs or gallops noted.   Abdomen:Soft, non-tender and non-distended with normal bowel sounds appreciated on auscultation.  Extremities:There isnopitting edema in the distal lower extremities bilaterally.   Skin: Warm and dry without trophic changes noted.  Musculoskeletal: exam reveals no obvious joint deformities, tenderness or joint swelling or erythema.    Neurologically:  Mental status: The patient is awake, alert and  oriented in all 4 spheres.Herimmediate and remote memory, attention, language skills and fund of knowledge are appropriate. There is no evidence of aphasia, agnosia, apraxia or anomia. Speech is clear with normal prosody and enunciation. Thought process is linear. Mood is normaland affect is normal.  Cranial nerves II - XII are as described above under HEENT exam. In addition: shoulder shrug is normal with equal shoulder height noted.  (On12/08/2019:On Archimedes spiral drawing she has no tremor with the right hand, fairly good spiral, left hand is insecure, handwriting is legible, not particularly tremulous, slightly on the smaller side.)  Motor exam:Overall, thin built, global strength normal for age, no one-sided weakness noted, mild intermittent right upper extremity tremor noted at rest. She has slight increase in tone with cogwheeling noted in the right upper extremity, otherwise good tone, fine motor skills with finger taps and foot agility, she has mild impairment on the right side, slightly abnormal findings on the left. Sensory exam is intact to light touch. Cerebellar testing shows no dysmetria or intention tremor, no ataxia, finger-to-nose is normal. Gait, station and balance:Shestands without major difficulty, posture is slightly stooped for age, she walks with decreased stride length and decreased pace, decreased arm swing bilaterally, more so on the R. Turns well, balance is fairly well preserved.  Assessmentand Plan:   In summary,Colleen Laughlinis a very pleasant 75 year oldfemalewith an underlying medical history of hyperlipidemia,andanxiety, whopresents for e follow-up consultation of her Parkinson's disease with right-sided predominance.  She has responded to levodopa therapy and is currently taking 2 pills 4 times a day on a 4 hourly schedule, starting at 7:30 AM.  We talked about fall risk  and the importance of fall prevention.  We talked about what her forgetfulness and she is advised that we should continue to monitor.  We talked about the importance of healthy lifestyle, good nutrition, good hydration, staying active mentally and physically.  She is encouraged to make an appointment with ophthalmology.  She has not yet seen an eye doctor since they moved from [place] over 15 months ago and I did notice a pupillary size discrepancy today that I do not remember seeing last time.  She does have a slight ptosis on the left but left pupil is a little larger than the right, so not consistent with Horner's.  She does have evidence of bilateral cataracts.  She is feeling stable on the Sinemet and we mutually agreed to keep her at the current dose.  They are reminded to monitor her driving skills. She has intermittent Issues with constipation and is reminded to be proactive about it. I recommended a follow-up routinely in about 6 months, sooner if needed. I answered all their questions today and the patient and her husband were in agreement. I spent 30 minutes in total face-to-face time and in reviewing records during pre-charting, more than 50% of which was spent in counseling and coordination of care, reviewing test results, reviewing medications and treatment regimen and/or in discussing or reviewing the diagnosis of PD, the prognosis and treatment options. Pertinent laboratory and imaging test results that were available during this visit with the patient were reviewed by me and considered in my medical decision making (see chart for details).

## 2020-11-01 ENCOUNTER — Encounter: Payer: Self-pay | Admitting: Nurse Practitioner

## 2020-11-01 ENCOUNTER — Other Ambulatory Visit: Payer: Self-pay

## 2020-11-01 ENCOUNTER — Ambulatory Visit (INDEPENDENT_AMBULATORY_CARE_PROVIDER_SITE_OTHER): Payer: Medicare HMO | Admitting: Nurse Practitioner

## 2020-11-01 VITALS — BP 143/74 | HR 70 | Temp 98.2°F | Resp 16 | Ht 63.0 in | Wt 125.0 lb

## 2020-11-01 DIAGNOSIS — J302 Other seasonal allergic rhinitis: Secondary | ICD-10-CM

## 2020-11-01 DIAGNOSIS — Z7689 Persons encountering health services in other specified circumstances: Secondary | ICD-10-CM

## 2020-11-01 DIAGNOSIS — G20 Parkinson's disease: Secondary | ICD-10-CM | POA: Diagnosis not present

## 2020-11-01 DIAGNOSIS — E559 Vitamin D deficiency, unspecified: Secondary | ICD-10-CM | POA: Diagnosis not present

## 2020-11-01 DIAGNOSIS — M25512 Pain in left shoulder: Secondary | ICD-10-CM | POA: Diagnosis not present

## 2020-11-01 DIAGNOSIS — E785 Hyperlipidemia, unspecified: Secondary | ICD-10-CM | POA: Diagnosis not present

## 2020-11-01 DIAGNOSIS — G5793 Unspecified mononeuropathy of bilateral lower limbs: Secondary | ICD-10-CM | POA: Diagnosis not present

## 2020-11-01 DIAGNOSIS — G8929 Other chronic pain: Secondary | ICD-10-CM

## 2020-11-01 DIAGNOSIS — G20A1 Parkinson's disease without dyskinesia, without mention of fluctuations: Secondary | ICD-10-CM

## 2020-11-01 DIAGNOSIS — Z139 Encounter for screening, unspecified: Secondary | ICD-10-CM

## 2020-11-01 DIAGNOSIS — R350 Frequency of micturition: Secondary | ICD-10-CM | POA: Diagnosis not present

## 2020-11-01 LAB — POCT URINALYSIS DIPSTICK
Bilirubin, UA: NEGATIVE
Glucose, UA: NEGATIVE
Ketones, UA: NEGATIVE
Nitrite, UA: POSITIVE
Protein, UA: NEGATIVE
Spec Grav, UA: 1.025 (ref 1.010–1.025)
Urobilinogen, UA: NEGATIVE E.U./dL — AB
pH, UA: 7.5 (ref 5.0–8.0)

## 2020-11-01 MED ORDER — SERTRALINE HCL 25 MG PO TABS: 50.0000 mg | ORAL_TABLET | Freq: Every day | ORAL | 1 refills | Status: DC

## 2020-11-01 MED ORDER — GABAPENTIN 100 MG PO CAPS: 100.0000 mg | ORAL_CAPSULE | Freq: Three times a day (TID) | ORAL | 3 refills | Status: DC

## 2020-11-01 MED ORDER — SIMVASTATIN 40 MG PO TABS: 40.0000 mg | ORAL_TABLET | Freq: Every day | ORAL | 1 refills | Status: DC

## 2020-11-01 MED ORDER — SULFAMETHOXAZOLE-TRIMETHOPRIM 800-160 MG PO TABS: 1.0000 | ORAL_TABLET | Freq: Two times a day (BID) | ORAL | 0 refills | Status: DC

## 2020-11-01 NOTE — Progress Notes (Signed)
New Patient Office Visit  Subjective:  Patient ID: Colleen Acevedo, female    DOB: 12-Apr-1946  Age: 74 y.o. MRN: [id number]  CC:  Chief Complaint  Patient presents with  . New Patient (Initial Visit)  . Peripheral Neuropathy    feet tingling   . Urinary Frequency    HPI Colleen Acevedo presents for new patient visit. She is transferring care from Dr. Nevada Crane. Last labs were drawn over 3 months. Last physical was about 2 months ago.  She is concerned with urinary frequency and is having urgency as well.  This has been going off and on for several months.  She will have her pessary changed next week.  She is still having neuropathy in both feet.  She is taking gabapentin 100 mg qhs, but she states she doesn't notice much of a difference.  She states that her legs ache at night when she is on her feet a lot.  She is having left shoulder pain that started a month ago. She has limited ROM to that shoulder.  She has aching and feeling like a pinching.  She rates the pain at 8/10.  Past Medical History:  Diagnosis Date  . Anxiety disorder   . Mixed hyperlipidemia   . Parkinson's disease ([place])   . Urinary incontinence    uses pessary    Past Surgical History:  Procedure Laterality Date  . BACK SURGERY    . BREAST BIOPSY Right   . KNEE SURGERY    . VAGINAL HYSTERECTOMY      Family History  Problem Relation Age of Onset  . Colon cancer Father   . Diabetes Mother   . Heart attack Brother   . Colon cancer Sister   . Breast cancer Sister   . Heart attack Brother   . Thyroid disease Daughter     Social History   Socioeconomic History  . Marital status: Married    Spouse name: Not on file  . Number of children: 2  . Years of education: Not on file  . Highest education level: Not on file  Occupational History    Comment: retired  Tobacco Use  . Smoking status: Never Smoker  . Smokeless tobacco: Never Used  Vaping Use  . Vaping Use: Never used  Substance and  Sexual Activity  . Alcohol use: Never  . Drug use: Never  . Sexual activity: Not Currently    Birth control/protection: Surgical    Comment: hyst  Other Topics Concern  . Not on file  Social History Narrative  . Not on file   Social Determinants of Health   Financial Resource Strain:   . Difficulty of Paying Living Expenses: Not on file  Food Insecurity:   . Worried About Charity fundraiser in the Last Year: Not on file  . Ran Out of Food in the Last Year: Not on file  Transportation Needs:   . Lack of Transportation (Medical): Not on file  . Lack of Transportation (Non-Medical): Not on file  Physical Activity:   . Days of Exercise per Week: Not on file  . Minutes of Exercise per Session: Not on file  Stress:   . Feeling of Stress : Not on file  Social Connections:   . Frequency of Communication with Friends and Family: Not on file  . Frequency of Social Gatherings with Friends and Family: Not on file  . Attends Religious Services: Not on file  . Active Member of Clubs or Organizations: Not  on file  . Attends Archivist Meetings: Not on file  . Marital Status: Not on file  Intimate Partner Violence:   . Fear of Current or Ex-Partner: Not on file  . Emotionally Abused: Not on file  . Physically Abused: Not on file  . Sexually Abused: Not on file    ROS Review of Systems  Constitutional: Negative.   Respiratory: Negative.   Cardiovascular: Negative.   Genitourinary: Positive for frequency and urgency. Negative for decreased urine volume, dysuria, flank pain, hematuria and pelvic pain.  Neurological: Positive for numbness. Negative for dizziness and weakness.  Psychiatric/Behavioral: Negative.     Objective:   Today's Vitals: BP (!) 143/74   Pulse 70   Temp 98.2 F (36.8 C)   Resp 16   Ht '5\' 3"'  (1.6 m)   Wt 125 lb (56.7 kg)   SpO2 96%   BMI 22.14 kg/m   Physical Exam Constitutional:      Appearance: Normal appearance.  Cardiovascular:      Rate and Rhythm: Normal rate and regular rhythm.     Pulses: Normal pulses.     Heart sounds: Normal heart sounds.  Pulmonary:     Effort: Pulmonary effort is normal.     Breath sounds: Normal breath sounds.  Abdominal:     Tenderness: There is no right CVA tenderness or left CVA tenderness.  Musculoskeletal:     Comments: Decreased ROM to left shoulder, has pain with extension;  Neurological:     General: No focal deficit present.     Mental Status: She is alert and oriented to person, place, and time.     Assessment & Plan:   Problem List Items Addressed This Visit      Nervous and Auditory   Parkinson disease ([place])    -followed by neurology, Dr. Rexene Alberts -takes carbidopa-levodopa 25/100 x2 tabs PO QID      Relevant Medications   gabapentin (NEURONTIN) 100 MG capsule   Neuropathy involving both lower extremities    -has been ongoing for several months -was taking gabapentin 100 mg qhs, but hasn't noticed much of a difference -INCREASE gabapentin to 100 mg TID -will have her address this with her neurologist      Relevant Medications   gabapentin (NEURONTIN) 100 MG capsule   sertraline (ZOLOFT) 25 MG tablet     Other   Encounter to establish care - Primary    -will check a set of labs prior to next visit -requesting medical records      Relevant Orders   CBC with Differential/Platelet   CMP14+EGFR   Lipid Panel With LDL/HDL Ratio   HCV Ab w/Rflx to Verification   Vitamin D deficiency    -no labs to review today -takes os cal with vit D -ordered DEXA scan today for osteoporosis screening      Seasonal allergies    -no issues today -takes loratadine 10 mg daily      Hyperlipidemia    -no labs to review today -taking simvastatin 40 mg daily      Relevant Medications   simvastatin (ZOCOR) 40 MG tablet   Urinary frequency    -states she has had urgency and frequency that has been off and on for several months -today she is experiencing symptoms -she has  a pessary and will have adjustments to this made next week -U/A today positive for UTI -Rx. bactrim      Relevant Orders   Urine Culture   Chronic left  shoulder pain   Relevant Medications   gabapentin (NEURONTIN) 100 MG capsule   sertraline (ZOLOFT) 25 MG tablet   Other Relevant Orders   Ambulatory referral to Orthopedic Surgery    Other Visit Diagnoses    Screening due       Relevant Orders   HCV Ab w/Rflx to Verification   DG Bone Density      Outpatient Encounter Medications as of 11/01/2020  Medication Sig  . calcium carbonate (OSCAL) 1500 (600 Ca) MG TABS tablet Take by mouth 2 (two) times daily with a meal.  . Calcium Carbonate-Vitamin D (CALCIUM 500 + D) 500-125 MG-UNIT TABS Take by mouth.  . carbidopa-levodopa (SINEMET IR) 25-100 MG tablet Take 2 tablets by mouth 4 (four) times daily. (Patient taking differently: Take by mouth 4 (four) times daily. )  . Chromium Picolinate 500 MCG CAPS Take by mouth.  . CRANBERRY PO Take by mouth.  . loratadine (CLARITIN) 10 MG tablet Take 10 mg by mouth daily.  Marland Kitchen OVER THE COUNTER MEDICATION Velbet El energy antler 250 mg  . sertraline (ZOLOFT) 25 MG tablet Take 2 tablets (50 mg total) by mouth daily.  . simvastatin (ZOCOR) 40 MG tablet Take 1 tablet (40 mg total) by mouth daily.  . vitamin B-12 (CYANOCOBALAMIN) 100 MCG tablet Take 100 mcg by mouth daily.  . [DISCONTINUED] sertraline (ZOLOFT) 25 MG tablet Take 50 mg by mouth daily.  . [DISCONTINUED] simvastatin (ZOCOR) 40 MG tablet 40 mg daily.   Marland Kitchen gabapentin (NEURONTIN) 100 MG capsule Take 1 capsule (100 mg total) by mouth 3 (three) times daily.  . Oxyquinoline-Sod Lauryl Sulf (TRIMO-SAN) 0.025-0.01 % GEL Place vaginally. (Patient not taking: Reported on 11/01/2020)  . sulfamethoxazole-trimethoprim (BACTRIM DS) 800-160 MG tablet Take 1 tablet by mouth 2 (two) times daily.  . [DISCONTINUED] sulfamethoxazole-trimethoprim (BACTRIM DS) 800-160 MG tablet Take 1 tablet by mouth 2 (two) times  daily. (Patient not taking: Reported on 11/01/2020)   No facility-administered encounter medications on file as of 11/01/2020.    Follow-up: Return in about 1 month (around 12/02/2020) for Lab follow-up.   Noreene Larsson, NP

## 2020-11-01 NOTE — Assessment & Plan Note (Addendum)
-  has been ongoing for several months -was taking gabapentin 100 mg qhs, but hasn't noticed much of a difference -INCREASE gabapentin to 100 mg TID -will have her address this with her neurologist

## 2020-11-01 NOTE — Assessment & Plan Note (Signed)
-  no labs to review today -taking simvastatin 40 mg daily

## 2020-11-01 NOTE — Assessment & Plan Note (Addendum)
-  no labs to review today -takes os cal with vit D -ordered DEXA scan today for osteoporosis screening

## 2020-11-01 NOTE — Addendum Note (Signed)
Addended by: Dellia Cloud on: 11/01/2020 09:22 AM   Modules accepted: Orders

## 2020-11-01 NOTE — Assessment & Plan Note (Signed)
-  no issues today -takes loratadine 10 mg daily

## 2020-11-01 NOTE — Assessment & Plan Note (Signed)
-  will check a set of labs prior to next visit -requesting medical records

## 2020-11-01 NOTE — Assessment & Plan Note (Signed)
-  followed by neurology, Dr. Frances Furbish -takes carbidopa-levodopa 25/100 x2 tabs PO QID

## 2020-11-01 NOTE — Assessment & Plan Note (Addendum)
-  states she has had urgency and frequency that has been off and on for several months -today she is experiencing symptoms -she has a pessary and will have adjustments to this made next week -U/A today positive for UTI -Rx. bactrim

## 2020-11-06 ENCOUNTER — Ambulatory Visit: Payer: Medicare HMO | Admitting: Obstetrics & Gynecology

## 2020-11-06 ENCOUNTER — Encounter: Payer: Self-pay | Admitting: Obstetrics & Gynecology

## 2020-11-06 ENCOUNTER — Other Ambulatory Visit: Payer: Self-pay

## 2020-11-06 VITALS — BP 118/72 | HR 81 | Ht 62.0 in | Wt 125.0 lb

## 2020-11-06 DIAGNOSIS — Z4689 Encounter for fitting and adjustment of other specified devices: Secondary | ICD-10-CM

## 2020-11-06 DIAGNOSIS — N3281 Overactive bladder: Secondary | ICD-10-CM

## 2020-11-06 LAB — URINE CULTURE

## 2020-11-06 MED ORDER — SOLIFENACIN SUCCINATE 10 MG PO TABS: ORAL_TABLET | ORAL | 11 refills | Status: DC

## 2020-11-06 NOTE — Progress Notes (Signed)
Chief Complaint  Patient presents with  . Pessary Check    Blood pressure 118/72, pulse 81, height 5\' 2"  (1.575 m), weight 125 lb (56.7 kg).  Colleen Acevedo presents today for routine follow up related to her pessary.   Gets up 3-4 times nightly and loses some urine  She uses a Milex ring with support #2 She reports no vaginal discharge and no vaginal bleeding   Likert scale(1 not bothersome -5 very bothersome)  :  1  Exam reveals no undue vaginal mucosal pressure of breakdown, no discharge and no vaginal bleeding.  Vaginal Epithelial Abnormality Classification System:   0 0    No abnormalities 1    Epithelial erythema 2    Granulation tissue 3    Epithelial break or erosion, 1 cm or less 4    Epithelial break or erosion, 1 cm or greater  The pessary is removed, cleaned and replaced without difficulty.    Impression/PLan:   ICD-10-CM   1. Pessary maintenance, Milex ring with support #2(placed [place], [place] years ago)  Z46.89    no complaints doing well  2. OAB (overactive bladder), diagnosed 11/06/20  N32.81    Begin vesicare 10 qhs    Meds ordered this encounter  Medications  . solifenacin (VESICARE) 10 MG tablet    Sig: 1 tablet nightly    Dispense:  30 tablet    Refill:  11    Colleen Acevedo will be sen back in 4 months for continued follow up.  [name], MD  11/06/2020 10:38 AM

## 2020-11-09 DIAGNOSIS — F3289 Other specified depressive episodes: Secondary | ICD-10-CM | POA: Diagnosis not present

## 2020-11-09 DIAGNOSIS — E785 Hyperlipidemia, unspecified: Secondary | ICD-10-CM | POA: Diagnosis not present

## 2020-11-21 ENCOUNTER — Encounter: Payer: Self-pay | Admitting: Orthopedic Surgery

## 2020-11-21 ENCOUNTER — Other Ambulatory Visit: Payer: Self-pay

## 2020-11-21 ENCOUNTER — Ambulatory Visit: Payer: Medicare HMO | Admitting: Orthopedic Surgery

## 2020-11-21 ENCOUNTER — Ambulatory Visit: Payer: Medicare HMO

## 2020-11-21 VITALS — BP 116/61 | HR 75 | Ht 62.0 in | Wt 125.0 lb

## 2020-11-21 DIAGNOSIS — M19012 Primary osteoarthritis, left shoulder: Secondary | ICD-10-CM

## 2020-11-21 DIAGNOSIS — M25512 Pain in left shoulder: Secondary | ICD-10-CM

## 2020-11-21 DIAGNOSIS — G8929 Other chronic pain: Secondary | ICD-10-CM | POA: Diagnosis not present

## 2020-11-21 NOTE — Progress Notes (Signed)
New Patient Visit  Assessment: Colleen Acevedo is a 74 y.o. female with the following: Left shoulder glenohumeral arthritis   Plan: [name] has advanced left glenohumeral arthritis.  We discussed options for treatment at this point, including medications, injection or possible consideration for surgery.  At this point, she has not attempted specific medications nor she had an injection.  I would defer surgery discussions for later.  After discussing these options, she is elected to proceed with a left shoulder injection.  I have advised her to continue with activities as tolerated.  If effective, we could repeat this injection in approximately 3 months.  Medications as needed.    Procedure note injection - Left Shoulder joint   Verbal consent was obtained to inject the Left Shoulder joint  Timeout was completed to confirm the site of injection.  The skin was prepped with alcohol and ethyl chloride was sprayed at the injection site.  A 21-gauge needle was used to inject 40 mg of Depo-Medrol and 1% lidocaine (3 cc) into the Left Shoulder using an Posterolateral approach.  There were no complications. A sterile bandage was applied.   Follow-up: Return if symptoms worsen or fail to improve.  Subjective:  Chief Complaint  Patient presents with  . Shoulder Pain    Left shoulder pain, worse in last 3 months, denies any injury     History of Present Illness: Colleen Acevedo is a 74 y.o. RHD female who has been referred to clinic today by Bjorn Pippin, NP for evaluation of left shoulder pain.  She denies a specific injury to her left shoulder.  She states it has been ongoing for several years, and has been progressively worsening.  She has noticed that it is particularly painful over the last 3 months.  Certain motions cause pain.  She is limited in her ability to lift items away from her body.  Thus far, she has tried some medications, but not on a consistent basis.  No physical  therapy.  She has not had an injection in her shoulder.   Review of Systems: No fevers or chills No numbness or tingling No chest pain No shortness of breath No bowel or bladder dysfunction No GI distress No headaches   Medical History:  Past Medical History:  Diagnosis Date  . Anxiety disorder   . Mixed hyperlipidemia   . Parkinson's disease (HCC)   . Urinary incontinence    uses pessary    Past Surgical History:  Procedure Laterality Date  . BACK SURGERY    . BREAST BIOPSY Right   . KNEE SURGERY    . VAGINAL HYSTERECTOMY      Family History  Problem Relation Age of Onset  . Colon cancer Father   . Diabetes Mother   . Heart attack Brother   . Colon cancer Sister   . Breast cancer Sister   . Heart attack Brother   . Thyroid disease Daughter    Social History   Tobacco Use  . Smoking status: Never Smoker  . Smokeless tobacco: Never Used  Vaping Use  . Vaping Use: Never used  Substance Use Topics  . Alcohol use: Never  . Drug use: Never    Allergies  Allergen Reactions  . Amoxicillin Itching and Rash    Current Meds  Medication Sig  . calcium carbonate (OSCAL) 1500 (600 Ca) MG TABS tablet Take by mouth 2 (two) times daily with a meal.  . Calcium Carbonate-Vitamin D (CALCIUM 500 + D) 500-125 MG-UNIT  TABS Take by mouth.  . carbidopa-levodopa (SINEMET IR) 25-100 MG tablet Take 2 tablets by mouth 4 (four) times daily. (Patient taking differently: Take by mouth 4 (four) times daily.)  . Chromium Picolinate 500 MCG CAPS Take by mouth.  . CRANBERRY PO Take by mouth.  . gabapentin (NEURONTIN) 100 MG capsule Take 1 capsule (100 mg total) by mouth 3 (three) times daily.  Marland Kitchen loratadine (CLARITIN) 10 MG tablet Take 10 mg by mouth daily.  Marland Kitchen OVER THE COUNTER MEDICATION Velbet El energy antler 250 mg  . Oxyquinoline-Sod Lauryl Sulf (TRIMO-SAN) 0.025-0.01 % GEL Place vaginally.  . sertraline (ZOLOFT) 25 MG tablet Take 2 tablets (50 mg total) by mouth daily.  .  simvastatin (ZOCOR) 40 MG tablet Take 1 tablet (40 mg total) by mouth daily.  . solifenacin (VESICARE) 10 MG tablet 1 tablet nightly  . sulfamethoxazole-trimethoprim (BACTRIM DS) 800-160 MG tablet Take 1 tablet by mouth 2 (two) times daily.  . vitamin B-12 (CYANOCOBALAMIN) 100 MCG tablet Take 100 mcg by mouth daily.    Objective: BP 116/61   Pulse 75   Ht 5\' 2"  (1.575 m)   Wt 125 lb (56.7 kg)   BMI 22.86 kg/m   Physical Exam:  General: Alert and oriented, no acute distress Gait: Normal  Evaluation of left shoulder demonstrates no atrophy.  There is no deformity.  100 degrees of forward flexion, 30 degrees of external rotation at her side.  Internal rotation to T12.  Significant pain in the empty can testing position.  Negative belly press.  5/5 strength in infraspinatus testing.  No crepitus is appreciated.    IMAGING: I personally ordered and reviewed the following images  XR of the left shoulder obtained in clinic today demonstrates moderate loss of glenohumeral joint space. There is a large inferior osteophyte on the humeral head, as well as a small inferior osteophyte on the glenoid.  There appear to be some small cysts within the superior aspect of the humeral head.  Impression: Moderate to severe left glenohumeral arthritis.  New Medications:  No orders of the defined types were placed in this encounter.     , MD  11/21/2020 10:08 AM

## 2020-11-21 NOTE — Patient Instructions (Signed)

## 2020-12-01 ENCOUNTER — Ambulatory Visit: Payer: Medicare HMO | Admitting: Nurse Practitioner

## 2020-12-05 ENCOUNTER — Telehealth (INDEPENDENT_AMBULATORY_CARE_PROVIDER_SITE_OTHER): Payer: Medicare HMO | Admitting: Family Medicine

## 2020-12-05 ENCOUNTER — Encounter: Payer: Self-pay | Admitting: Family Medicine

## 2020-12-05 ENCOUNTER — Other Ambulatory Visit: Payer: Self-pay

## 2020-12-05 VITALS — Ht 62.0 in | Wt 125.0 lb

## 2020-12-05 DIAGNOSIS — G5793 Unspecified mononeuropathy of bilateral lower limbs: Secondary | ICD-10-CM | POA: Diagnosis not present

## 2020-12-05 NOTE — Progress Notes (Signed)
Virtual Visit via Telephone Note   This visit type was conducted due to national recommendations for restrictions regarding the COVID-19 Pandemic (e.g. social distancing) in an effort to limit this patient's exposure and mitigate transmission in our community.  Due to her co-morbid illnesses, this patient is at least at moderate risk for complications without adequate follow up.  This format is felt to be most appropriate for this patient at this time.  The patient did not have access to video technology/had technical difficulties with video requiring transitioning to audio format only (telephone).  All issues noted in this document were discussed and addressed.  No physical exam could be performed with this format.    Evaluation Performed:  Follow-up visit  Date:  12/05/2020   ID:  Colleen Acevedo, DOB 11/17/1946, MRN [id number]  Patient Location: Home Provider Location: Office/Clinic   Participants: Nurse for intake and work up; Patient and Provider for Visit and Wrap up  Method of visit: Telephone  Location of Patient: Home Location of Provider: Office Consent was obtain for visit over the telephone. Services rendered by provider: Visit was performed via telephone  I verified that I am speaking with the correct person using two identifiers.  PCP:  Heather Roberts, NP   Chief Complaint:  F/u on labs and Neurontin med dose change   History of Present Illness:    Colleen Acevedo is a 75 y.o. female with history as stated below.  Reports today for follow up on med dose increase on gabapentin and labs. However, she has not started the medication dose increase that Casimiro Needle sent in and has not gotten her labs.  She reports she does not have any changes since talking with him a month back. She is out of her gabapentin but is wiling to go get the script sent back in Dec and start it.  She also has not reached out to her neurologist yet about other options or ideas for her  neuropathy.   Overall she is doing well and has no other issues to discuss today.    She is encouraged to come in and get her labs this week- fasting   The patient does not have symptoms concerning for COVID-19 infection (fever, chills, cough, or new shortness of breath).   Past Medical, Surgical, Social History, Allergies, and Medications have been Reviewed.  Past Medical History:  Diagnosis Date  . Anxiety disorder   . Mixed hyperlipidemia   . Parkinson's disease (HCC)   . Urinary incontinence    uses pessary   Past Surgical History:  Procedure Laterality Date  . BACK SURGERY    . BREAST BIOPSY Right   . KNEE SURGERY    . VAGINAL HYSTERECTOMY       Current Meds  Medication Sig  . calcium carbonate (OSCAL) 1500 (600 Ca) MG TABS tablet Take by mouth 2 (two) times daily with a meal.  . Calcium Carbonate-Vitamin D (CALCIUM 500 + D) 500-125 MG-UNIT TABS Take by mouth.  . carbidopa-levodopa (SINEMET IR) 25-100 MG tablet Take 2 tablets by mouth 4 (four) times daily. (Patient taking differently: Take by mouth 4 (four) times daily.)  . Chromium Picolinate 500 MCG CAPS Take by mouth.  . CRANBERRY PO Take by mouth.  . gabapentin (NEURONTIN) 100 MG capsule Take 1 capsule (100 mg total) by mouth 3 (three) times daily.  Marland Kitchen loratadine (CLARITIN) 10 MG tablet Take 10 mg by mouth daily.  Marland Kitchen OVER THE COUNTER MEDICATION Velbet El energy  antler 250 mg  . Oxyquinoline-Sod Lauryl Sulf (TRIMO-SAN) 0.025-0.01 % GEL Place vaginally.  . sertraline (ZOLOFT) 25 MG tablet Take 2 tablets (50 mg total) by mouth daily.  . simvastatin (ZOCOR) 40 MG tablet Take 1 tablet (40 mg total) by mouth daily.  . solifenacin (VESICARE) 10 MG tablet 1 tablet nightly  . sulfamethoxazole-trimethoprim (BACTRIM DS) 800-160 MG tablet Take 1 tablet by mouth 2 (two) times daily.  . vitamin B-12 (CYANOCOBALAMIN) 100 MCG tablet Take 100 mcg by mouth daily.     Allergies:   Amoxicillin   ROS:   Please see the history of  present illness.    All other systems reviewed and are negative.   Labs/Other Tests and Data Reviewed:    Recent Labs: No results found for requested labs within last 8760 hours.   Recent Lipid Panel No results found for: CHOL, TRIG, HDL, CHOLHDL, LDLCALC, LDLDIRECT  Wt Readings from Last 3 Encounters:  12/05/20 125 lb (56.7 kg)  11/21/20 125 lb (56.7 kg)  11/06/20 125 lb (56.7 kg)     Objective:    Vital Signs:  Ht 5\' 2"  (1.575 m)   Wt 125 lb (56.7 kg)   BMI 22.86 kg/m    VITAL SIGNS:  reviewed GEN:  no acute distress RESPIRATORY:  no shortness of breath in converssation  PSYCH:  normal affect  ASSESSMENT & PLAN:    1. Neuropathy involving both lower extremities   Time:   Today, I have spent 7 minutes with the patient with telehealth technology discussing the above problems.     Medication Adjustments/Labs and Tests Ordered: Current medicines are reviewed at length with the patient today.  Concerns regarding medicines are outlined above.   Tests Ordered: No orders of the defined types were placed in this encounter.   Medication Changes: No orders of the defined types were placed in this encounter.    Disposition:  Follow up 1 month w , Jilda Panda, NP  12/05/2020 8:47 AM     02/02/2021 [facility]

## 2020-12-05 NOTE — Assessment & Plan Note (Signed)
Needs to start the med dose change Casimiro Needle send in an increase dose to help with her symptoms, however she had not started it. She is willing to start it now and is advised to pick up medication at pharmacy and to call if there is any issue. She is also encouraged to reach out to her Neurologist on this as well.

## 2020-12-05 NOTE — Patient Instructions (Signed)
I appreciate the opportunity to provide you with care for your health and wellness.  Follow up: 1 month with Casimiro Needle in the office would be best   No labs or referrals today  Please come in this week to get your fasting labs.  Please pick up your new dose of gabapentin and take as ordered so we can see if this is helpful for you.  Call if you have any issues or concerns.  Please continue to practice social distancing to keep you, your family, and our community safe.  If you must go out, please wear a mask and practice good handwashing.  It was a pleasure to see you and I look forward to continuing to work together on your health and well-being. Please do not hesitate to call the office if you need care or have questions about your care.  Have a wonderful day. With Gratitude, Tereasa Coop, DNP, AGNP-BC

## 2020-12-07 DIAGNOSIS — Z7689 Persons encountering health services in other specified circumstances: Secondary | ICD-10-CM | POA: Diagnosis not present

## 2020-12-07 DIAGNOSIS — Z139 Encounter for screening, unspecified: Secondary | ICD-10-CM | POA: Diagnosis not present

## 2020-12-07 DIAGNOSIS — E785 Hyperlipidemia, unspecified: Secondary | ICD-10-CM | POA: Diagnosis not present

## 2020-12-08 LAB — LIPID PANEL WITH LDL/HDL RATIO
Cholesterol, Total: 183 mg/dL (ref 100–199)
HDL: 73 mg/dL (ref >39)
LDL Chol Calc (NIH): 92 mg/dL (ref 0–99)
LDL/HDL Ratio: 1.3 ratio (ref 0.0–3.2)
Triglycerides: 101 mg/dL (ref 0–149)
VLDL Cholesterol Cal: 18 mg/dL (ref 5–40)

## 2020-12-08 LAB — CBC WITH DIFFERENTIAL/PLATELET
Basophils Absolute: 0.1 10*3/uL (ref 0.0–0.2)
Basos: 1 %
EOS (ABSOLUTE): 0.1 10*3/uL (ref 0.0–0.4)
Eos: 2 %
Hematocrit: 44.1 % (ref 34.0–46.6)
Hemoglobin: 15.2 g/dL (ref 11.1–15.9)
Immature Grans (Abs): 0 10*3/uL (ref 0.0–0.1)
Immature Granulocytes: 1 %
Lymphocytes Absolute: 1.6 10*3/uL (ref 0.7–3.1)
Lymphs: 23 %
MCH: 30.6 pg (ref 26.6–33.0)
MCHC: 34.5 g/dL (ref 31.5–35.7)
MCV: 89 fL (ref 79–97)
Monocytes Absolute: 0.5 10*3/uL (ref 0.1–0.9)
Monocytes: 7 %
Neutrophils Absolute: 4.6 10*3/uL (ref 1.4–7.0)
Neutrophils: 66 %
Platelets: 227 10*3/uL (ref 150–450)
RBC: 4.97 x10E6/uL (ref 3.77–5.28)
RDW: 12.9 % (ref 11.7–15.4)
WBC: 6.9 10*3/uL (ref 3.4–10.8)

## 2020-12-08 LAB — CMP14+EGFR
ALT: 9 IU/L (ref 0–32)
AST: 15 IU/L (ref 0–40)
Albumin/Globulin Ratio: 2.1 (ref 1.2–2.2)
Albumin: 4.5 g/dL (ref 3.7–4.7)
Alkaline Phosphatase: 86 IU/L (ref 44–121)
BUN/Creatinine Ratio: 27 (ref 12–28)
BUN: 22 mg/dL (ref 8–27)
Bilirubin Total: 0.6 mg/dL (ref 0.0–1.2)
CO2: 27 mmol/L (ref 20–29)
Calcium: 10 mg/dL (ref 8.7–10.3)
Chloride: 100 mmol/L (ref 96–106)
Creatinine, Ser: 0.82 mg/dL (ref 0.57–1.00)
GFR calc Af Amer: 80 mL/min/{1.73_m2} (ref >59)
GFR calc non Af Amer: 70 mL/min/{1.73_m2} (ref >59)
Globulin, Total: 2.1 g/dL (ref 1.5–4.5)
Glucose: 96 mg/dL (ref 65–99)
Potassium: 4.1 mmol/L (ref 3.5–5.2)
Sodium: 141 mmol/L (ref 134–144)
Total Protein: 6.6 g/dL (ref 6.0–8.5)

## 2020-12-08 LAB — HCV AB W/RFLX TO VERIFICATION: HCV Ab: <0.1 s/co ratio (ref 0.0–0.9)

## 2020-12-08 LAB — HCV INTERPRETATION

## 2020-12-08 NOTE — Progress Notes (Signed)
Cholesterol, kidney function, liver function, and blood panel all look great. We will meet up at her next appt in February.

## 2020-12-14 ENCOUNTER — Inpatient Hospital Stay (HOSPITAL_COMMUNITY): Admission: RE | Admit: 2020-12-14 | Payer: Medicare HMO | Source: Ambulatory Visit

## 2021-01-10 ENCOUNTER — Other Ambulatory Visit: Payer: Self-pay | Admitting: Nurse Practitioner

## 2021-01-10 ENCOUNTER — Encounter: Payer: Self-pay | Admitting: Nurse Practitioner

## 2021-01-10 ENCOUNTER — Other Ambulatory Visit: Payer: Self-pay

## 2021-01-10 ENCOUNTER — Ambulatory Visit (INDEPENDENT_AMBULATORY_CARE_PROVIDER_SITE_OTHER): Payer: Medicare HMO | Admitting: Nurse Practitioner

## 2021-01-10 VITALS — BP 131/69 | HR 82 | Temp 97.6°F | Resp 18 | Ht 63.0 in | Wt 121.0 lb

## 2021-01-10 DIAGNOSIS — E785 Hyperlipidemia, unspecified: Secondary | ICD-10-CM

## 2021-01-10 DIAGNOSIS — E559 Vitamin D deficiency, unspecified: Secondary | ICD-10-CM

## 2021-01-10 DIAGNOSIS — R350 Frequency of micturition: Secondary | ICD-10-CM

## 2021-01-10 DIAGNOSIS — G5793 Unspecified mononeuropathy of bilateral lower limbs: Secondary | ICD-10-CM | POA: Diagnosis not present

## 2021-01-10 LAB — POCT URINALYSIS DIPSTICK
Bilirubin, UA: NEGATIVE
Blood, UA: NEGATIVE
Glucose, UA: NEGATIVE
Ketones, UA: NEGATIVE
Nitrite, UA: POSITIVE
Protein, UA: NEGATIVE
Spec Grav, UA: >=1.03 — AB (ref 1.010–1.025)
Urobilinogen, UA: 2 E.U./dL — AB
pH, UA: 6 (ref 5.0–8.0)

## 2021-01-10 MED ORDER — CIPROFLOXACIN HCL 500 MG PO TABS: 500.0000 mg | ORAL_TABLET | Freq: Two times a day (BID) | ORAL | 0 refills | Status: DC

## 2021-01-10 NOTE — Progress Notes (Signed)
Sent in cipro for her

## 2021-01-10 NOTE — Assessment & Plan Note (Addendum)
-  she states she sees neurology for this in about a month -she states this has been better after starting her bladder medication -she takes vesicare for OAB, and that looks like Dr. Despina Hidden started this in Dec 2021

## 2021-01-10 NOTE — Progress Notes (Signed)
Established Patient Office Visit  Subjective:  Patient ID: Colleen Acevedo, female    DOB: Jan 09, 1946  Age: 75 y.o. MRN: [id number]  CC:  Chief Complaint  Patient presents with  . Follow-up    Pt had UTI; wants to make sure UA is clear.     HPI Colleen Acevedo presents for 30-monthfollow-up for neuropathy. She had a phone visit with HJarrett Sohoto discuss neuropathy about a month ago, and at that time she had not taken the increased dose of gabapentin, hadn't had a recent lab draw, and hadn't reached out to her neurologist. Since then, HJarrett Sohorefilled her higher dose of gabapentin, and her fasting labs were perfect. She states that she is not taking gabapentin, because that did not help. She states that her feet feel better after starting the bladder medication. She has an appointment with her neurologist next month.  Past Medical History:  Diagnosis Date  . Anxiety disorder   . Mixed hyperlipidemia   . Parkinson's disease ([place]   . Urinary incontinence    uses pessary    Past Surgical History:  Procedure Laterality Date  . BACK SURGERY    . BREAST BIOPSY Right   . KNEE SURGERY    . VAGINAL HYSTERECTOMY      Family History  Problem Relation Age of Onset  . Colon cancer Father   . Diabetes Mother   . Heart attack Brother   . Colon cancer Sister   . Breast cancer Sister   . Heart attack Brother   . Thyroid disease Daughter     Social History   Socioeconomic History  . Marital status: Married    Spouse name: Not on file  . Number of children: 2  . Years of education: Not on file  . Highest education level: Not on file  Occupational History    Comment: retired  Tobacco Use  . Smoking status: Never Smoker  . Smokeless tobacco: Never Used  Vaping Use  . Vaping Use: Never used  Substance and Sexual Activity  . Alcohol use: Never  . Drug use: Never  . Sexual activity: Not Currently    Birth control/protection: Surgical    Comment: hyst  Other Topics  Concern  . Not on file  Social History Narrative  . Not on file   Social Determinants of Health   Financial Resource Strain: Not on file  Food Insecurity: Not on file  Transportation Needs: Not on file  Physical Activity: Not on file  Stress: Not on file  Social Connections: Not on file  Intimate Partner Violence: Not on file    Outpatient Medications Prior to Visit  Medication Sig Dispense Refill  . calcium carbonate (OSCAL) 1500 (600 Ca) MG TABS tablet Take by mouth 2 (two) times daily with a meal.    . Calcium Carbonate-Vitamin D (CALCIUM 500 + D) 500-125 MG-UNIT TABS Take by mouth.    . carbidopa-levodopa (SINEMET IR) 25-100 MG tablet Take 2 tablets by mouth 4 (four) times daily. (Patient taking differently: Take by mouth 4 (four) times daily.) 720 tablet 3  . Chromium Picolinate 500 MCG CAPS Take by mouth.    . CRANBERRY PO Take by mouth.    . gabapentin (NEURONTIN) 100 MG capsule Take 1 capsule (100 mg total) by mouth 3 (three) times daily. 90 capsule 3  . loratadine (CLARITIN) 10 MG tablet Take 10 mg by mouth daily.    .Marland KitchenOVER THE COUNTER MEDICATION Velbet El energy antler 250 mg    .  Oxyquinoline-Sod Lauryl Sulf (TRIMO-SAN) 0.025-0.01 % GEL Place vaginally.    . sertraline (ZOLOFT) 25 MG tablet Take 2 tablets (50 mg total) by mouth daily. 90 tablet 1  . simvastatin (ZOCOR) 40 MG tablet Take 1 tablet (40 mg total) by mouth daily. 90 tablet 1  . solifenacin (VESICARE) 10 MG tablet 1 tablet nightly 30 tablet 11  . vitamin B-12 (CYANOCOBALAMIN) 100 MCG tablet Take 100 mcg by mouth daily.    Marland Kitchen sulfamethoxazole-trimethoprim (BACTRIM DS) 800-160 MG tablet Take 1 tablet by mouth 2 (two) times daily. 14 tablet 0   No facility-administered medications prior to visit.    Allergies  Allergen Reactions  . Amoxicillin Itching and Rash    ROS Review of Systems  Constitutional: Negative.   HENT: Negative.   Respiratory: Negative.   Cardiovascular: Negative.   Musculoskeletal:  Negative.   Neurological: Positive for tremors and numbness.       Bilateral lower leg neuropathy      Objective:    Physical Exam Constitutional:      Appearance: She is ill-appearing.  Cardiovascular:     Rate and Rhythm: Normal rate and regular rhythm.     Pulses: Normal pulses.     Heart sounds: Normal heart sounds.  Pulmonary:     Effort: Pulmonary effort is normal.     Breath sounds: Normal breath sounds.  Neurological:     Mental Status: She is alert and oriented to person, place, and time. Mental status is at baseline.     Comments: Obvious tremor  Psychiatric:        Mood and Affect: Mood normal.        Behavior: Behavior normal.        Thought Content: Thought content normal.        Judgment: Judgment normal.     BP 131/69   Pulse 82   Temp 97.6 F (36.4 C)   Resp 18   Ht 5' 3" (1.6 m)   Wt 121 lb (54.9 kg)   SpO2 97%   BMI 21.43 kg/m  Wt Readings from Last 3 Encounters:  01/10/21 121 lb (54.9 kg)  12/05/20 125 lb (56.7 kg)  11/21/20 125 lb (56.7 kg)     Health Maintenance Due  Topic Date Due  . TETANUS/TDAP  Never done  . DEXA SCAN  Never done  . PNA vac Low Risk Adult (1 of 2 - PCV13) Never done    There are no preventive care reminders to display for this patient.  No results found for: TSH Lab Results  Component Value Date   WBC 6.9 12/07/2020   HGB 15.2 12/07/2020   HCT 44.1 12/07/2020   MCV 89 12/07/2020   PLT 227 12/07/2020   Lab Results  Component Value Date   NA 141 12/07/2020   K 4.1 12/07/2020   CO2 27 12/07/2020   GLUCOSE 96 12/07/2020   BUN 22 12/07/2020   CREATININE 0.82 12/07/2020   BILITOT 0.6 12/07/2020   ALKPHOS 86 12/07/2020   AST 15 12/07/2020   ALT 9 12/07/2020   PROT 6.6 12/07/2020   ALBUMIN 4.5 12/07/2020   CALCIUM 10.0 12/07/2020   Lab Results  Component Value Date   CHOL 183 12/07/2020   Lab Results  Component Value Date   HDL 73 12/07/2020   Lab Results  Component Value Date   LDLCALC 92  12/07/2020   Lab Results  Component Value Date   TRIG 101 12/07/2020   No results found  for: CHOLHDL No results found for: HGBA1C    Assessment & Plan:   Problem List Items Addressed This Visit      Nervous and Auditory   Neuropathy involving both lower extremities    -she states she sees neurology for this in about a month -she states this has been better after starting her bladder medication -she takes vesicare for OAB, and that looks like Dr. Elonda Husky started this in Dec 2021        Other   Vitamin D deficiency - Primary   Relevant Orders   Vitamin D (25 hydroxy)   Hyperlipidemia    Lab Results  Component Value Date   CHOL 183 12/07/2020   HDL 73 12/07/2020   [place] 92 12/07/2020   TRIG 101 12/07/2020   -lipids look great      Relevant Orders   CBC with Differential/Platelet   CMP14+EGFR   Lipid Panel With LDL/HDL Ratio      No orders of the defined types were placed in this encounter.   Follow-up: Return in about 4 months (around 05/10/2021) for Lab follow-up (vit D and routine labs).    Noreene Larsson, NP

## 2021-01-10 NOTE — Addendum Note (Signed)
Addended by: Dellia Cloud on: 01/10/2021 12:04 PM   Modules accepted: Orders

## 2021-01-10 NOTE — Assessment & Plan Note (Signed)
Lab Results  Component Value Date   CHOL 183 12/07/2020   HDL 73 12/07/2020   LDLCALC 92 12/07/2020   TRIG 101 12/07/2020   -lipids look great

## 2021-01-13 LAB — URINE CULTURE

## 2021-01-15 ENCOUNTER — Other Ambulatory Visit: Payer: Self-pay

## 2021-01-15 ENCOUNTER — Ambulatory Visit (HOSPITAL_COMMUNITY)
Admission: RE | Admit: 2021-01-15 | Discharge: 2021-01-15 | Disposition: A | Discharge status: Home / Self Care | Payer: Medicare HMO | Source: Ambulatory Visit | Attending: Internal Medicine | Admitting: Internal Medicine

## 2021-01-15 DIAGNOSIS — Z1231 Encounter for screening mammogram for malignant neoplasm of breast: Secondary | ICD-10-CM

## 2021-01-15 NOTE — Progress Notes (Signed)
The cipro we sent in should work for her UTI, so no reason to change medications.

## 2021-03-06 ENCOUNTER — Ambulatory Visit: Payer: Medicare HMO | Admitting: Neurology

## 2021-03-08 ENCOUNTER — Ambulatory Visit: Payer: Medicare HMO | Admitting: Adult Health

## 2021-03-16 ENCOUNTER — Telehealth: Payer: Self-pay | Admitting: Nurse Practitioner

## 2021-03-16 NOTE — Telephone Encounter (Signed)
Spoke to patient to  schedule Medicare Annual Wellness Visit (AWV) either virtually or in office.  She is out of town and will call back in June to schedule   AWV-I PER PALMETTO 08/25/10   please schedule at anytime with [facility]  health coach  This should be a 40 minute visit.

## 2021-03-20 DIAGNOSIS — G20 Parkinson's disease: Secondary | ICD-10-CM | POA: Diagnosis not present

## 2021-03-20 DIAGNOSIS — N939 Abnormal uterine and vaginal bleeding, unspecified: Secondary | ICD-10-CM | POA: Diagnosis not present

## 2021-03-20 DIAGNOSIS — R102 Pelvic and perineal pain: Secondary | ICD-10-CM | POA: Diagnosis not present

## 2021-03-20 DIAGNOSIS — T8389XA Other specified complication of genitourinary prosthetic devices, implants and grafts, initial encounter: Secondary | ICD-10-CM | POA: Diagnosis not present

## 2021-03-20 DIAGNOSIS — Z90710 Acquired absence of both cervix and uterus: Secondary | ICD-10-CM | POA: Diagnosis not present

## 2021-03-20 DIAGNOSIS — N898 Other specified noninflammatory disorders of vagina: Secondary | ICD-10-CM | POA: Diagnosis not present

## 2021-04-11 ENCOUNTER — Ambulatory Visit: Payer: Medicare HMO | Admitting: Neurology

## 2021-04-11 ENCOUNTER — Encounter: Payer: Self-pay | Admitting: Neurology

## 2021-04-11 VITALS — BP 122/78 | HR 70 | Ht 63.0 in | Wt 123.0 lb

## 2021-04-11 DIAGNOSIS — G20 Parkinson's disease: Secondary | ICD-10-CM

## 2021-04-11 MED ORDER — CARBIDOPA-LEVODOPA 25-100 MG PO TABS: 2.0000 | ORAL_TABLET | Freq: Four times a day (QID) | ORAL | 3 refills | Status: DC

## 2021-04-11 NOTE — Progress Notes (Signed)
Subjective:    Patient ID: Colleen Acevedo is a 75 y.o. female.  HPI     Interim history:   Colleen Acevedo is a 75 year old right-handed woman with an underlying medical history of hyperlipidemia, anxiety, who presents for Follow-up consultation of her Parkinson's disease.  The patient is unaccompanied today.  I last saw her on 09/05/2020, at which time she was fairly stable, had no recent falls.  She was advised to follow-up routinely in 6 months.  Today, 04/11/21: She reports  having had interim stressors, she and her husband are separated since February 2022 and she moved in with her daughter next-door and her family.  Daughter has 2 children, she also visited her oldest sister in [place] and her other daughter in the [place] in April, had a fall at [place], she tripped over something and landed on her left knee which was bruised and sore but she did not have any sequelae or serious injuries thankfully.  She is trying to hydrate well, she does report that her tremor gets worse when she is stressed.  Her husband continues to reside next-door. She has not had any other new symptoms.  She tries to stay active, weight has been stable, increased a little bit, tries to hydrate well with water.    The patient's allergies, current medications, family history, past medical history, past social history, past surgical history and problem list were reviewed and updated as appropriate.    Previously:   I saw her on 03/06/2020, at which time she was taking Sinemet 2 pills 4 times daily.  Her husband was worried about her unintentional weight loss.  This was ongoing over a few years.  She was advised to talk to her primary care physician about additional work-up for weight loss.  We talked about fall prevention.  She had fallen twice since her previous visit.  She was advised to hydrate better and to be proactive about constipation issues.  She was advised to continue with Sinemet at the current dose.        I first met her on 11/04/2019, at which time she reported a diagnosis of Parkinson's disease since 2016.  She was on Sinemet, 2 pills in the morning, 3 midday and 2 in the evening.  She was advised change her regimen to 2 pills 4 times a day on a scheduled 4 hourly basis.     11/04/19: (She) was diagnosed with Parkinson's disease in 2016, while she was still residing in [place].  She has moved to [facility].  She was followed by a neurologist.  I was able to review some of her neurology records from [facility] in [place], [place].  I reviewed the office note from 03/17/2018, at which time the patient was deemed stable as far as her Parkinson's disease and was advised to continue with Sinemet 2 pills 3 times daily.  She was advised to increase it if needed to 2 pills 4 times a day.  I reviewed your office note from 08/11/2019, which you kindly included. She has been on Sinemet since 2016. She had blood work through your office on 08/06/2019 and I reviewed the results, CBC with differential and platelets was unremarkable, CMP showed benign findings, lipid panel showed total cholesterol of 155, triglycerides 82, LDL 16. They moved at the end of June, she is originally from [place], they have a daughter in [place] and one in [place] and they moved to be closer to her.  Daughter  lives next door and they have 2 grandchildren from her and also great-grandchildren.  The patient is a non-smoker and does not utilize alcohol and drinks caffeine and limitation, 1 cup of coffee in the morning typically.  She has not had any recent falls thankfully, she has had some intermittent issues with constipation, takes a probiotic and sometimes a laxative.  She admits that she does not drink a whole lot of water, estimates that she drinks about 1 bottle of water per day on average.  She is physically quite active, no issues with memory, mood is stable.  She has no family history of Parkinson's disease. Her  symptoms started towards the end of 2015, may be in December with a right hand tremor and feeling nervous.  She has not had a brain scan such as CT or MRI.She has been followed by Dr. Carleene Cooper. She currently takes Sinemet 2 pills at 7 AM, 3 pills at 12 and 2 pills at 8 PM.  She does take the medication often with her meals.   Her Past Medical History Is Significant For: Past Medical History:  Diagnosis Date  . Anxiety disorder   . Mixed hyperlipidemia   . Parkinson's disease ([place])   . Urinary incontinence    uses pessary    Her Past Surgical History Is Significant For: Past Surgical History:  Procedure Laterality Date  . BACK SURGERY    . BREAST BIOPSY Right   . KNEE SURGERY    . VAGINAL HYSTERECTOMY      Her Family History Is Significant For: Family History  Problem Relation Age of Onset  . Colon cancer Father   . Diabetes Mother   . Heart attack Brother   . Colon cancer Sister   . Breast cancer Sister   . Heart attack Brother   . Thyroid disease Daughter     Her Social History Is Significant For: Social History   Socioeconomic History  . Marital status: Married    Spouse name: Not on file  . Number of children: 2  . Years of education: Not on file  . Highest education level: Not on file  Occupational History    Comment: retired  Tobacco Use  . Smoking status: Never Smoker  . Smokeless tobacco: Never Used  Vaping Use  . Vaping Use: Never used  Substance and Sexual Activity  . Alcohol use: Never  . Drug use: Never  . Sexual activity: Not Currently    Birth control/protection: Surgical    Comment: hyst  Other Topics Concern  . Not on file  Social History Narrative  . Not on file   Social Determinants of Health   Financial Resource Strain: Not on file  Food Insecurity: Not on file  Transportation Needs: Not on file  Physical Activity: Not on file  Stress: Not on file  Social Connections: Not on file    Her Allergies Are:  Allergies   Allergen Reactions  . Amoxicillin Itching and Rash  :   Her Current Medications Are:  Outpatient Encounter Medications as of 04/11/2021  Medication Sig  . calcium carbonate (OSCAL) 1500 (600 Ca) MG TABS tablet Take by mouth 2 (two) times daily with a meal.  . Calcium Carbonate-Vitamin D (CALCIUM 500 + D) 500-125 MG-UNIT TABS Take by mouth.  . carbidopa-levodopa (SINEMET IR) 25-100 MG tablet Take 2 tablets by mouth 4 (four) times daily. (Patient taking differently: Take by mouth 4 (four) times daily.)  . Chromium Picolinate 500 MCG CAPS Take  by mouth.  . ciprofloxacin (CIPRO) 500 MG tablet Take 1 tablet (500 mg total) by mouth 2 (two) times daily.  Marland Kitchen CRANBERRY PO Take by mouth.  . gabapentin (NEURONTIN) 100 MG capsule Take 1 capsule (100 mg total) by mouth 3 (three) times daily.  Marland Kitchen loratadine (CLARITIN) 10 MG tablet Take 10 mg by mouth daily.  Marland Kitchen OVER THE COUNTER MEDICATION Velbet El energy antler 250 mg  . Oxyquinoline-Sod Lauryl Sulf (TRIMO-SAN) 0.025-0.01 % GEL Place vaginally.  . sertraline (ZOLOFT) 25 MG tablet Take 2 tablets (50 mg total) by mouth daily.  . simvastatin (ZOCOR) 40 MG tablet Take 1 tablet (40 mg total) by mouth daily.  . solifenacin (VESICARE) 10 MG tablet 1 tablet nightly  . vitamin B-12 (CYANOCOBALAMIN) 100 MCG tablet Take 100 mcg by mouth daily.   No facility-administered encounter medications on file as of 04/11/2021.  :  Review of Systems:  Out of a complete 14 point review of systems, all are reviewed and negative with the exception of these symptoms as listed below: Review of Systems  Neurological:       Here for f/u on P/d. Reports she has fallen since her last visit no serious injuries. No new sx. Reports she is under more stress now though.     Objective:  Neurological Exam  Physical Exam Physical Examination:   Vitals:   04/11/21 1250  BP: 122/78  Pulse: 70  SpO2: 98%    General Examination: The patient is a very pleasant 75 y.o. female in no  acute distress. She appears well-developed and well-nourished and well groomed.   HEENT:Normocephalic, atraumatic, pupils are round and reactive to light, extraocular tracking is mildly impaired, she wears corrective eyeglasses.  She has bilateral cataracts.  She has mild left ptosis and left pupil is a little larger than the right, stable. She has mild facial masking minimal to mild nuchal rigidity is noted, no lip, neck or jaw tremor, airway examination reveals mild mouth dryness, tongue protrudes centrally and palate elevates symmetrically, she has mild hypophonia.No sialorrhea.  Chest:Clear to auscultation without wheezing, rhonchi or crackles noted.  Heart:S1+S2+0, regular and normal without murmurs, rubs or gallops noted.   Abdomen:Soft, non-tender and non-distended.  Extremities:There isnopitting edema in the distal lower extremities bilaterally.   Skin: Warm and dry without trophic changes noted.  Musculoskeletal: exam reveals no obvious joint deformities.   Neurologically:  Mental status: The patient is awake, alert and oriented in all 4 spheres.Herimmediate and remote memory, attention, language skills and fund of knowledge are appropriate. There is no evidence of aphasia, agnosia, apraxia or anomia. Speech is clear with normal prosody and enunciation. Thought process is linear. Mood is normaland affect is normal.  Cranial nerves II - XII are as described above under HEENT exam.   (On12/08/2019:On Archimedes spiral drawing she has no tremor with the right hand, fairly good spiral, left hand is insecure, handwriting is legible, not particularly tremulous, slightly on the smaller side.)  Motor exam:Overall, thin built, global strength normal for age, no one-sided weakness noted, mild intermittent right upper extremity tremor notedat rest.She has slight increase in tone with cogwheeling noted in the right upper extremity, otherwise good tone, fine motor skills  with finger taps and foot agility, she has mild impairment on the right side,slightly abnormal findings on the left. Sensory exam is intact to light touch. Cerebellar testing shows no dysmetria or intention tremor. Gait, station and balance:Shestands without major difficulty, posture is slightly stooped for age, she walks  with decreased stride length and decreased pace, decreased arm swing bilaterally, more so on the R. Turns well, balance isfairlywell preserved.  Assessmentand Plan:   In summary,Annina Laughlinis a very pleasant 71 year oldfemalewith an underlying medical history of hyperlipidemia,andanxiety, whopresents for efollow-up consultation of her Parkinson's disease with right-sided predominance. She has responded to levodopa therapy and is currently taking 2 pills 4 times a day on a 4 hourly schedule, starting at 7:30 AM. We talked about fall risk and the importance of fall prevention. We talked about the importance of healthy lifestyle, good nutrition, good hydration, staying active mentally and physically.  He has had interim stress.  She has previously had mild forgetfulness but things are stable and she is overall managing well, has moved in with her daughter since she is going through a separation from her husband. She is overall feeling stable on the Sinemet and we mutually agreed to keep her at the current dose. I recommended a follow-up routinely inabout 37month, sooner if needed. I answered all her questions today and the patient was in agreement. I spent 30 minutes in total face-to-face time and in reviewing records during pre-charting, more than 50% of which was spent in counseling and coordination of care, reviewing test results, reviewing medications and treatment regimen and/or in discussing or reviewing the diagnosis of PD, the prognosis and treatment options. Pertinent laboratory and imaging test results that were available during this visit with the patient  were reviewed by me and considered in my medical decision making (see chart for details).

## 2021-04-11 NOTE — Patient Instructions (Signed)
It was good to see you again today. I am sorry to hear that you are going through stressful times.  As discussed, we will maintain your Sinemet at the current dose and timings.  Please continue to try to take care of yourself including good hydration, try to get enough rest, try to make sure you get good nutrition and maintain your weight.  Follow-up routinely in 6 months, sooner if needed.

## 2021-04-16 ENCOUNTER — Ambulatory Visit (HOSPITAL_COMMUNITY)
Admission: RE | Admit: 2021-04-16 | Discharge: 2021-04-16 | Disposition: A | Discharge status: Home / Self Care | Payer: Medicare HMO | Source: Ambulatory Visit | Attending: Nurse Practitioner | Admitting: Nurse Practitioner

## 2021-04-16 ENCOUNTER — Other Ambulatory Visit: Payer: Self-pay

## 2021-04-16 DIAGNOSIS — Z13820 Encounter for screening for osteoporosis: Secondary | ICD-10-CM | POA: Diagnosis not present

## 2021-04-16 DIAGNOSIS — Z139 Encounter for screening, unspecified: Secondary | ICD-10-CM

## 2021-04-16 DIAGNOSIS — Z780 Asymptomatic menopausal state: Secondary | ICD-10-CM | POA: Insufficient documentation

## 2021-04-16 DIAGNOSIS — M85852 Other specified disorders of bone density and structure, left thigh: Secondary | ICD-10-CM | POA: Diagnosis not present

## 2021-04-16 NOTE — Progress Notes (Signed)
Bone density test showed osteopenia, not osteoporosis, so no need to add or change medicines. Weight bearing exercises like walking and dancing can promote bone health.

## 2021-04-19 ENCOUNTER — Ambulatory Visit (INDEPENDENT_AMBULATORY_CARE_PROVIDER_SITE_OTHER): Payer: Medicare HMO | Admitting: Adult Health

## 2021-04-19 ENCOUNTER — Encounter: Payer: Self-pay | Admitting: Adult Health

## 2021-04-19 ENCOUNTER — Other Ambulatory Visit: Payer: Self-pay

## 2021-04-19 VITALS — BP 120/63 | HR 73 | Ht 63.0 in | Wt 132.5 lb

## 2021-04-19 DIAGNOSIS — Z90710 Acquired absence of both cervix and uterus: Secondary | ICD-10-CM | POA: Diagnosis not present

## 2021-04-19 DIAGNOSIS — N3281 Overactive bladder: Secondary | ICD-10-CM | POA: Diagnosis not present

## 2021-04-19 DIAGNOSIS — Z4689 Encounter for fitting and adjustment of other specified devices: Secondary | ICD-10-CM

## 2021-04-19 MED ORDER — SOLIFENACIN SUCCINATE 10 MG PO TABS: ORAL_TABLET | ORAL | 11 refills | Status: DC

## 2021-04-19 NOTE — Progress Notes (Signed)
  Subjective:     Patient ID: Colleen Acevedo, female   DOB: 1946-07-05, 75 y.o.   MRN: [id number]  HPI Colleen Acevedo is a 75 year old white female, married, sp hysterectomy in to have pessary reinserted, has been out since April, when had vaginal bleeding and irritation, has discharge a times now, no bleeding PCP is Colleen Axon NP.  Review of Systems Has had vaginal discharge,no bleeding  +urinary frequency She is not sexually active.  Reviewed past medical,surgical, social and family history. Reviewed medications and allergies.     Objective:   Physical Exam BP 120/63 (BP Location: Left Arm, Patient Position: Sitting, Cuff Size: Normal)   Pulse 73   Ht 5\' 3"  (1.6 m)   Wt 132 lb 8 oz (60.1 kg)   BMI 23.47 kg/m    Skin warm and dry.Pelvic: external genitalia is normal in appearance no lesions, vagina: scant discharge without odor, no irritation or lesions noted,urethra has no lesions or masses noted, cervix and uterus are absent, adnexa: no masses or tenderness noted. Bladder is non tender and no masses felt. Pessary easily inserted. Fall risk is low  Upstream - 04/19/21 0911      Pregnancy Intention Screening   Does the patient want to become pregnant in the next year? N/A    Does the patient's partner want to become pregnant in the next year? N/A    Would the patient like to discuss contraceptive options today? N/A      Contraception Wrap Up   Current Method Female Sterilization   hyst   End Method Female Sterilization   hyst   Contraception Counseling Provided No         Examination chaperoned by 04/21/21 LPN  Assessment:     1. Encounter for pessary maintenance Milex #2 ring with support   2. S/P hysterectomy Use estrace cream 2 x weekly   3. OAB (overactive bladder), diagnosed 11/06/20 Refilled vesicare Meds ordered this encounter  Medications  . solifenacin (VESICARE) 10 MG tablet    Sig: 1 tablet nightly    Dispense:  30 tablet    Refill:  11    Order  Specific Question:   Supervising Provider    Answer:   11/08/20 [2510]      Plan:     Follow up in 4 months for pessary maintenance

## 2021-05-04 DIAGNOSIS — E785 Hyperlipidemia, unspecified: Secondary | ICD-10-CM | POA: Diagnosis not present

## 2021-05-04 DIAGNOSIS — E559 Vitamin D deficiency, unspecified: Secondary | ICD-10-CM | POA: Diagnosis not present

## 2021-05-05 LAB — CBC WITH DIFFERENTIAL/PLATELET
Basophils Absolute: 0 10*3/uL (ref 0.0–0.2)
Basos: 1 %
EOS (ABSOLUTE): 0.3 10*3/uL (ref 0.0–0.4)
Eos: 4 %
Hematocrit: 44.7 % (ref 34.0–46.6)
Hemoglobin: 15.2 g/dL (ref 11.1–15.9)
Immature Grans (Abs): 0 10*3/uL (ref 0.0–0.1)
Immature Granulocytes: 0 %
Lymphocytes Absolute: 1.3 10*3/uL (ref 0.7–3.1)
Lymphs: 23 %
MCH: 30.6 pg (ref 26.6–33.0)
MCHC: 34 g/dL (ref 31.5–35.7)
MCV: 90 fL (ref 79–97)
Monocytes Absolute: 0.5 10*3/uL (ref 0.1–0.9)
Monocytes: 9 %
Neutrophils Absolute: 3.6 10*3/uL (ref 1.4–7.0)
Neutrophils: 63 %
Platelets: 226 10*3/uL (ref 150–450)
RBC: 4.97 x10E6/uL (ref 3.77–5.28)
RDW: 12.9 % (ref 11.7–15.4)
WBC: 5.8 10*3/uL (ref 3.4–10.8)

## 2021-05-05 LAB — CMP14+EGFR
ALT: 6 IU/L (ref 0–32)
AST: 11 IU/L (ref 0–40)
Albumin/Globulin Ratio: 2 (ref 1.2–2.2)
Albumin: 4.3 g/dL (ref 3.7–4.7)
Alkaline Phosphatase: 78 IU/L (ref 44–121)
BUN/Creatinine Ratio: 27 (ref 12–28)
BUN: 21 mg/dL (ref 8–27)
Bilirubin Total: 0.6 mg/dL (ref 0.0–1.2)
CO2: 26 mmol/L (ref 20–29)
Calcium: 9.7 mg/dL (ref 8.7–10.3)
Chloride: 100 mmol/L (ref 96–106)
Creatinine, Ser: 0.77 mg/dL (ref 0.57–1.00)
Globulin, Total: 2.2 g/dL (ref 1.5–4.5)
Glucose: 87 mg/dL (ref 65–99)
Potassium: 4.4 mmol/L (ref 3.5–5.2)
Sodium: 142 mmol/L (ref 134–144)
Total Protein: 6.5 g/dL (ref 6.0–8.5)
eGFR: 80 mL/min/{1.73_m2} (ref >59)

## 2021-05-05 LAB — LIPID PANEL WITH LDL/HDL RATIO
Cholesterol, Total: 178 mg/dL (ref 100–199)
HDL: 64 mg/dL (ref >39)
LDL Chol Calc (NIH): 100 mg/dL — ABNORMAL HIGH (ref 0–99)
LDL/HDL Ratio: 1.6 ratio (ref 0.0–3.2)
Triglycerides: 76 mg/dL (ref 0–149)
VLDL Cholesterol Cal: 14 mg/dL (ref 5–40)

## 2021-05-05 LAB — VITAMIN D 25 HYDROXY (VIT D DEFICIENCY, FRACTURES): Vit D, 25-Hydroxy: 33.5 ng/mL (ref 30.0–100.0)

## 2021-05-07 NOTE — Progress Notes (Signed)
Labs look great.

## 2021-05-10 ENCOUNTER — Other Ambulatory Visit: Payer: Self-pay

## 2021-05-10 ENCOUNTER — Encounter: Payer: Self-pay | Admitting: Nurse Practitioner

## 2021-05-10 ENCOUNTER — Ambulatory Visit: Payer: Medicare HMO | Admitting: Nurse Practitioner

## 2021-05-10 VITALS — BP 145/74 | HR 81 | Temp 99.1°F | Resp 18 | Ht 63.0 in | Wt 133.0 lb

## 2021-05-10 DIAGNOSIS — N3281 Overactive bladder: Secondary | ICD-10-CM

## 2021-05-10 DIAGNOSIS — E559 Vitamin D deficiency, unspecified: Secondary | ICD-10-CM

## 2021-05-10 DIAGNOSIS — F324 Major depressive disorder, single episode, in partial remission: Secondary | ICD-10-CM

## 2021-05-10 DIAGNOSIS — G5793 Unspecified mononeuropathy of bilateral lower limbs: Secondary | ICD-10-CM | POA: Diagnosis not present

## 2021-05-10 DIAGNOSIS — E785 Hyperlipidemia, unspecified: Secondary | ICD-10-CM | POA: Diagnosis not present

## 2021-05-10 DIAGNOSIS — G20 Parkinson's disease: Secondary | ICD-10-CM | POA: Diagnosis not present

## 2021-05-10 MED ORDER — PREGABALIN 50 MG PO CAPS: 50.0000 mg | ORAL_CAPSULE | Freq: Three times a day (TID) | ORAL | 1 refills | Status: DC

## 2021-05-10 MED ORDER — SIMVASTATIN 40 MG PO TABS: 40.0000 mg | ORAL_TABLET | Freq: Every day | ORAL | 1 refills | Status: DC

## 2021-05-10 MED ORDER — SERTRALINE HCL 25 MG PO TABS: 50.0000 mg | ORAL_TABLET | Freq: Every day | ORAL | 1 refills | Status: DC

## 2021-05-10 NOTE — Assessment & Plan Note (Signed)
-  followed by neurology, Dr. Frances Furbish -takes carbidopa-levodopa 25/100 x2 tabs PO QID -she states that she may need refills soon; she will contact Dr. Frances Furbish for these, if unable to contact, I would consider sending in a refill for her

## 2021-05-10 NOTE — Progress Notes (Signed)
Established Patient Office Visit  Subjective:  Patient ID: Colleen Acevedo, female    DOB: 04-08-1946  Age: 75 y.o. MRN: [id number]  CC:  Chief Complaint  Patient presents with   Follow-up    Go over labs     HPI Colleen Acevedo presents for lab follow-up.  She states that she has been having reflux, and she states that she has been having to throw up while eating about twice per week.  She states that she feels nauseated while eating, but she feels better after eating. Her sister has a similar issue.  She states that her feel are tingling.  She states that the tingling is constant and at the top of her feet near her toes.  She has been taking gabapentin, but she state that hasn't helped much.     Past Medical History:  Diagnosis Date   Anxiety disorder    Mixed hyperlipidemia    Parkinson's disease ([place])    Urinary incontinence    uses pessary    Past Surgical History:  Procedure Laterality Date   BACK SURGERY     BREAST BIOPSY Right    KNEE SURGERY     VAGINAL HYSTERECTOMY      Family History  Problem Relation Age of Onset   Colon cancer Father    Diabetes Mother    Heart attack Brother    Colon cancer Sister    Breast cancer Sister    Heart attack Brother    Thyroid disease Daughter     Social History   Socioeconomic History   Marital status: Married    Spouse name: Not on file   Number of children: 2   Years of education: Not on file   Highest education level: Not on file  Occupational History    Comment: retired  Tobacco Use   Smoking status: Never   Smokeless tobacco: Never  Vaping Use   Vaping Use: Never used  Substance and Sexual Activity   Alcohol use: Never   Drug use: Never   Sexual activity: Not Currently    Birth control/protection: Surgical    Comment: hyst  Other Topics Concern   Not on file  Social History Narrative   Not on file   Social Determinants of Health   Financial Resource Strain: Not on file  Food  Insecurity: Not on file  Transportation Needs: Not on file  Physical Activity: Not on file  Stress: Not on file  Social Connections: Not on file  Intimate Partner Violence: Not on file    Outpatient Medications Prior to Visit  Medication Sig Dispense Refill   calcium carbonate (OSCAL) 1500 (600 Ca) MG TABS tablet Take by mouth 2 (two) times daily with a meal.     Calcium Carbonate-Vitamin D (CALCIUM 500 + D) 500-125 MG-UNIT TABS Take by mouth.     carbidopa-levodopa (SINEMET IR) 25-100 MG tablet Take 2 tablets by mouth 4 (four) times daily. 720 tablet 3   Chromium Picolinate 500 MCG CAPS Take by mouth.     ciprofloxacin (CIPRO) 500 MG tablet Take 1 tablet (500 mg total) by mouth 2 (two) times daily. 14 tablet 0   CRANBERRY PO Take by mouth.     estradiol (ESTRACE) 0.1 MG/GM vaginal cream Place 1 Applicatorful vaginally. Every other day     loratadine (CLARITIN) 10 MG tablet Take 10 mg by mouth daily.     OVER THE COUNTER MEDICATION Velbet El energy antler 250 mg     solifenacin (  VESICARE) 10 MG tablet 1 tablet nightly 30 tablet 11   vitamin B-12 (CYANOCOBALAMIN) 100 MCG tablet Take 100 mcg by mouth daily.     gabapentin (NEURONTIN) 100 MG capsule Take 1 capsule (100 mg total) by mouth 3 (three) times daily. 90 capsule 3   sertraline (ZOLOFT) 25 MG tablet Take 2 tablets (50 mg total) by mouth daily. 90 tablet 1   simvastatin (ZOCOR) 40 MG tablet Take 1 tablet (40 mg total) by mouth daily. 90 tablet 1   No facility-administered medications prior to visit.    Allergies  Allergen Reactions   Amoxicillin Itching and Rash    ROS Review of Systems    Objective:    Physical Exam  BP (!) 145/74 (BP Location: Right Arm, Patient Position: Sitting, Cuff Size: Normal)   Pulse 81   Temp 99.1 F (37.3 C)   Resp 18   Ht _0  (1.6 m)   Wt 133 lb (60.3 kg)   SpO2 94%   BMI 23.56 kg/m  Wt Readings from Last 3 Encounters:  05/10/21 133 lb (60.3 kg)  04/19/21 132 lb 8 oz (60.1 kg)   04/11/21 123 lb (55.8 kg)     Health Maintenance Due  Topic Date Due   TETANUS/TDAP  Never done   Zoster Vaccines- Shingrix (1 of 2) Never done   PNA vac Low Risk Adult (1 of 2 - PCV13) Never done   COVID-19 Vaccine (4 - Booster for Moderna series) 01/19/2021    There are no preventive care reminders to display for this patient.  No results found for: TSH Lab Results  Component Value Date   WBC 5.8 05/04/2021   HGB 15.2 05/04/2021   HCT 44.7 05/04/2021   MCV 90 05/04/2021   PLT 226 05/04/2021   Lab Results  Component Value Date   NA 142 05/04/2021   K 4.4 05/04/2021   CO2 26 05/04/2021   GLUCOSE 87 05/04/2021   BUN 21 05/04/2021   CREATININE 0.77 05/04/2021   BILITOT 0.6 05/04/2021   ALKPHOS 78 05/04/2021   AST 11 05/04/2021   ALT 6 05/04/2021   PROT 6.5 05/04/2021   ALBUMIN 4.3 05/04/2021   CALCIUM 9.7 05/04/2021   EGFR 80 05/04/2021   Lab Results  Component Value Date   CHOL 178 05/04/2021   Lab Results  Component Value Date   HDL 64 05/04/2021   Lab Results  Component Value Date   LDLCALC 100 (H) 05/04/2021   Lab Results  Component Value Date   TRIG 76 05/04/2021   No results found for: CHOLHDL No results found for: HGBA1C    Assessment & Plan:   Problem List Items Addressed This Visit       Nervous and Auditory   Parkinson disease ([place])    -followed by neurology, Dr. Rexene Alberts -takes carbidopa-levodopa 25/100 x2 tabs PO QID -she states that she may need refills soon; she will contact Dr. Rexene Alberts for these, if unable to contact, I would consider sending in a refill for her       Relevant Medications   pregabalin (LYRICA) 50 MG capsule   Neuropathy involving both lower extremities - Primary    -we tried gabapentin, but she states that she hasn't seen much improvement -STOP gabapentin -Rx. lyrica       Relevant Medications   sertraline (ZOLOFT) 25 MG tablet   pregabalin (LYRICA) 50 MG capsule     Genitourinary   OAB (overactive  bladder), diagnosed 11/06/20    -  Dr. Elonda Husky started her on vesicare in Dec 2021 -f/u with Dr. Elonda Husky as needed         Other   Vitamin D deficiency    -Vit D WNL -continue current supplementation       Hyperlipidemia    Lab Results  Component Value Date   CHOL 178 05/04/2021   HDL 64 05/04/2021   LDLCALC 100 (H) 05/04/2021   TRIG 76 05/04/2021  -doing well -refilled simvastatin        Relevant Medications   simvastatin (ZOCOR) 40 MG tablet   Other Relevant Orders   CBC with Differential/Platelet   CMP14+EGFR   Lipid Panel With LDL/HDL Ratio   Depression, major, single episode, in partial remission ([place])    -refilled sertraline -doing well       Relevant Medications   sertraline (ZOLOFT) 25 MG tablet    Meds ordered this encounter  Medications   sertraline (ZOLOFT) 25 MG tablet    Sig: Take 2 tablets (50 mg total) by mouth daily.    Dispense:  90 tablet    Refill:  1   simvastatin (ZOCOR) 40 MG tablet    Sig: Take 1 tablet (40 mg total) by mouth daily.    Dispense:  90 tablet    Refill:  1   pregabalin (LYRICA) 50 MG capsule    Sig: Take 1 capsule (50 mg total) by mouth 3 (three) times daily.    Dispense:  270 capsule    Refill:  1    STOP gabapentin    Follow-up: Return in about 6 months (around 11/09/2021) for Physical Exam (HLD).    Noreene Larsson, NP

## 2021-05-10 NOTE — Patient Instructions (Addendum)
Your labs looked great today!  Please have fasting labs drawn 2-3 days prior to your appointment so we can discuss the results during your office visit.

## 2021-05-10 NOTE — Assessment & Plan Note (Signed)
Lab Results  Component Value Date   CHOL 178 05/04/2021   HDL 64 05/04/2021   LDLCALC 100 (H) 05/04/2021   TRIG 76 05/04/2021  -doing well -refilled simvastatin

## 2021-05-10 NOTE — Assessment & Plan Note (Signed)
-  we tried gabapentin, but she states that she hasn't seen much improvement -STOP gabapentin -Rx. lyrica

## 2021-05-10 NOTE — Assessment & Plan Note (Signed)
-  Dr. Despina Hidden started her on vesicare in Dec 2021 -f/u with Dr. Despina Hidden as needed

## 2021-05-10 NOTE — Assessment & Plan Note (Signed)
-  refilled sertraline -doing well

## 2021-05-10 NOTE — Assessment & Plan Note (Signed)
-  Vit D WNL -continue current supplementation

## 2021-06-26 DIAGNOSIS — Z0100 Encounter for examination of eyes and vision without abnormal findings: Secondary | ICD-10-CM | POA: Diagnosis not present

## 2021-06-26 DIAGNOSIS — H5213 Myopia, bilateral: Secondary | ICD-10-CM | POA: Diagnosis not present

## 2021-06-26 DIAGNOSIS — H5210 Myopia, unspecified eye: Secondary | ICD-10-CM | POA: Diagnosis not present

## 2021-07-09 ENCOUNTER — Encounter: Payer: Self-pay | Admitting: Nurse Practitioner

## 2021-07-09 ENCOUNTER — Ambulatory Visit (INDEPENDENT_AMBULATORY_CARE_PROVIDER_SITE_OTHER): Payer: Medicare HMO | Admitting: Nurse Practitioner

## 2021-07-09 ENCOUNTER — Ambulatory Visit: Payer: Medicare HMO | Admitting: Nurse Practitioner

## 2021-07-09 ENCOUNTER — Encounter (INDEPENDENT_AMBULATORY_CARE_PROVIDER_SITE_OTHER): Payer: Self-pay | Admitting: *Deleted

## 2021-07-09 ENCOUNTER — Other Ambulatory Visit: Payer: Self-pay

## 2021-07-09 VITALS — BP 113/67 | HR 75 | Temp 98.1°F | Ht 62.0 in | Wt 135.0 lb

## 2021-07-09 DIAGNOSIS — K219 Gastro-esophageal reflux disease without esophagitis: Secondary | ICD-10-CM | POA: Insufficient documentation

## 2021-07-09 MED ORDER — OMEPRAZOLE 40 MG PO CPDR: 40.0000 mg | DELAYED_RELEASE_CAPSULE | Freq: Every day | ORAL | 3 refills | Status: DC

## 2021-07-09 NOTE — Progress Notes (Signed)
Acute Office Visit  Subjective:    Patient ID: Colleen Acevedo, female    DOB: 09-21-1946, 75 y.o.   MRN: [id number]  Chief Complaint  Patient presents with   Gastroesophageal Reflux    Worsening over the past 3 months, has had it for awhile previously. Wants a referral.    HPI Patient is in today for GERD. She states that for the past 3 months she has pain with eating and it causes her to vomit 3-4 times per month. After vomiting, she is able to continue eating. She would like to see a GI specialist.  Past Medical History:  Diagnosis Date   Anxiety disorder    Mixed hyperlipidemia    Parkinson's disease ([place])    Urinary incontinence    uses pessary    Past Surgical History:  Procedure Laterality Date   BACK SURGERY     BREAST BIOPSY Right    KNEE SURGERY     VAGINAL HYSTERECTOMY      Family History  Problem Relation Age of Onset   Colon cancer Father    Diabetes Mother    Heart attack Brother    Colon cancer Sister    Breast cancer Sister    Heart attack Brother    Thyroid disease Daughter     Social History   Socioeconomic History   Marital status: Married    Spouse name: Not on file   Number of children: 2   Years of education: Not on file   Highest education level: Not on file  Occupational History    Comment: retired  Tobacco Use   Smoking status: Never   Smokeless tobacco: Never  Vaping Use   Vaping Use: Never used  Substance and Sexual Activity   Alcohol use: Never   Drug use: Never   Sexual activity: Not Currently    Birth control/protection: Surgical    Comment: hyst  Other Topics Concern   Not on file  Social History Narrative   Not on file   Social Determinants of Health   Financial Resource Strain: Not on file  Food Insecurity: Not on file  Transportation Needs: Not on file  Physical Activity: Not on file  Stress: Not on file  Social Connections: Not on file  Intimate Partner Violence: Not on file    Outpatient  Medications Prior to Visit  Medication Sig Dispense Refill   calcium carbonate (OSCAL) 1500 (600 Ca) MG TABS tablet Take by mouth 2 (two) times daily with a meal.     Calcium Carbonate-Vitamin D (CALCIUM 500 + D) 500-125 MG-UNIT TABS Take by mouth.     carbidopa-levodopa (SINEMET IR) 25-100 MG tablet Take 2 tablets by mouth 4 (four) times daily. 720 tablet 3   Chromium Picolinate 500 MCG CAPS Take by mouth.     CRANBERRY PO Take by mouth.     estradiol (ESTRACE) 0.1 MG/GM vaginal cream Place 1 Applicatorful vaginally. Every other day     loratadine (CLARITIN) 10 MG tablet Take 10 mg by mouth daily.     OVER THE COUNTER MEDICATION Velbet El energy antler 250 mg     pregabalin (LYRICA) 50 MG capsule Take 1 capsule (50 mg total) by mouth 3 (three) times daily. 270 capsule 1   sertraline (ZOLOFT) 25 MG tablet Take 2 tablets (50 mg total) by mouth daily. 90 tablet 1   simvastatin (ZOCOR) 40 MG tablet Take 1 tablet (40 mg total) by mouth daily. 90 tablet 1   solifenacin (VESICARE)  10 MG tablet 1 tablet nightly 30 tablet 11   vitamin B-12 (CYANOCOBALAMIN) 100 MCG tablet Take 100 mcg by mouth daily.     ciprofloxacin (CIPRO) 500 MG tablet Take 1 tablet (500 mg total) by mouth 2 (two) times daily. 14 tablet 0   No facility-administered medications prior to visit.    Allergies  Allergen Reactions   Amoxicillin Itching and Rash    Review of Systems  Constitutional: Negative.   Respiratory: Negative.    Cardiovascular: Negative.   Gastrointestinal:  Positive for abdominal pain and vomiting. Negative for blood in stool, constipation and diarrhea.      Objective:    Physical Exam Constitutional:      Appearance: Normal appearance.  Cardiovascular:     Rate and Rhythm: Normal rate and regular rhythm.     Pulses: Normal pulses.     Heart sounds: Normal heart sounds.  Pulmonary:     Effort: Pulmonary effort is normal.     Breath sounds: Normal breath sounds.  Abdominal:     General:  Abdomen is flat. Bowel sounds are normal. There is no distension.     Palpations: Abdomen is soft. There is no mass.     Tenderness: There is no abdominal tenderness. There is no guarding or rebound.     Hernia: No hernia is present.  Neurological:     Mental Status: She is alert.    BP 113/67 (BP Location: Right Arm, Patient Position: Sitting, Cuff Size: Normal)   Pulse 75   Temp 98.1 F (36.7 C) (Oral)   Ht '5\' 2"'  (1.575 m)   Wt 135 lb (61.2 kg)   SpO2 95%   BMI 24.69 kg/m  Wt Readings from Last 3 Encounters:  07/09/21 135 lb (61.2 kg)  05/10/21 133 lb (60.3 kg)  04/19/21 132 lb 8 oz (60.1 kg)    Health Maintenance Due  Topic Date Due   Zoster Vaccines- Shingrix (1 of 2) Never done   PNA vac Low Risk Adult (1 of 2 - PCV13) Never done   INFLUENZA VACCINE  06/25/2021    There are no preventive care reminders to display for this patient.   No results found for: TSH Lab Results  Component Value Date   WBC 5.8 05/04/2021   HGB 15.2 05/04/2021   HCT 44.7 05/04/2021   MCV 90 05/04/2021   PLT 226 05/04/2021   Lab Results  Component Value Date   NA 142 05/04/2021   K 4.4 05/04/2021   CO2 26 05/04/2021   GLUCOSE 87 05/04/2021   BUN 21 05/04/2021   CREATININE 0.77 05/04/2021   BILITOT 0.6 05/04/2021   ALKPHOS 78 05/04/2021   AST 11 05/04/2021   ALT 6 05/04/2021   PROT 6.5 05/04/2021   ALBUMIN 4.3 05/04/2021   CALCIUM 9.7 05/04/2021   EGFR 80 05/04/2021   Lab Results  Component Value Date   CHOL 178 05/04/2021   Lab Results  Component Value Date   HDL 64 05/04/2021   Lab Results  Component Value Date   LDLCALC 100 (H) 05/04/2021   Lab Results  Component Value Date   TRIG 76 05/04/2021   No results found for: CHOLHDL No results found for: HGBA1C     Assessment & Plan:   Problem List Items Addressed This Visit       Digestive   GERD (gastroesophageal reflux disease) - Primary    -referral to GI per pt request -Rx. Omeprazole -recommended OTC  mylanta for PRN  use      Relevant Medications   omeprazole (PRILOSEC) 40 MG capsule   Other Relevant Orders   Ambulatory referral to Gastroenterology     Meds ordered this encounter  Medications   omeprazole (PRILOSEC) 40 MG capsule    Sig: Take 1 capsule (40 mg total) by mouth daily.    Dispense:  30 capsule    Refill:  [place], NP

## 2021-07-09 NOTE — Assessment & Plan Note (Signed)
-  referral to GI per pt request -Rx. Omeprazole -recommended OTC mylanta for PRN use

## 2021-07-12 ENCOUNTER — Other Ambulatory Visit: Payer: Self-pay | Admitting: Nurse Practitioner

## 2021-07-12 DIAGNOSIS — F324 Major depressive disorder, single episode, in partial remission: Secondary | ICD-10-CM

## 2021-08-08 ENCOUNTER — Other Ambulatory Visit: Payer: Self-pay | Admitting: Neurology

## 2021-08-08 NOTE — Telephone Encounter (Signed)
Called pt.  She did want her prescription to go to mail order [facility]).  I relayed will do this for her.  She then asked about having tingling in her toes constantly.  Has been on gabapentin and now pregabalin and it is not helping.  We have  not seen her for this issue.  Her pcp has been treating.  I relayed that have her pcp refer to [place] for new problem and we will be glad to see her.  She verbalized understanding.

## 2021-08-20 ENCOUNTER — Encounter: Payer: Self-pay | Admitting: Adult Health

## 2021-08-20 ENCOUNTER — Ambulatory Visit: Payer: Medicare HMO | Admitting: Adult Health

## 2021-08-20 ENCOUNTER — Other Ambulatory Visit: Payer: Self-pay

## 2021-08-20 VITALS — BP 123/71 | HR 75 | Ht 62.0 in | Wt 140.5 lb

## 2021-08-20 DIAGNOSIS — Z90710 Acquired absence of both cervix and uterus: Secondary | ICD-10-CM

## 2021-08-20 DIAGNOSIS — N898 Other specified noninflammatory disorders of vagina: Secondary | ICD-10-CM | POA: Diagnosis not present

## 2021-08-20 DIAGNOSIS — Z4689 Encounter for fitting and adjustment of other specified devices: Secondary | ICD-10-CM

## 2021-08-20 MED ORDER — METRONIDAZOLE 0.75 % VA GEL: 1.0000 | Freq: Every day | VAGINAL | 0 refills | Status: DC

## 2021-08-20 NOTE — Progress Notes (Signed)
  Subjective:     Patient ID: Colleen Acevedo, female   DOB: Feb 13, 1946, 75 y.o.   MRN: [id number]  HPI Colleen Acevedo is a 75 year old white female,married, sp hysterectomy in for pessary maintenance and has noted vaginal discharge, no bleeding. PCP is Laury Axon NP.  Review of Systems +vaginal discharge Reviewed past medical,surgical, social and family history. Reviewed medications and allergies.     Objective:   Physical Exam BP 123/71 (BP Location: Left Arm, Patient Position: Sitting, Cuff Size: Normal)   Pulse 75   Ht 5\' 2"  (1.575 m)   Wt 140 lb 8 oz (63.7 kg)   BMI 25.70 kg/m     Skin warm and dry.Pelvic: external genitalia is normal in appearance no lesions, vagina, pessary removed and washed with soap and water, has tan discharge, with slight odor, no lesions or bleeding, and milex ring #2 with support easily reinserted,urethra has no lesions or masses noted, cervix and uterus absent,adnexa: no masses or tenderness noted. Bladder is non tender and no masses felt.  Fall risk is moderate  [facility] - 08/20/21 1040       Pregnancy Intention Screening   Does the patient want to become pregnant in the next year? N/A    Does the patient's partner want to become pregnant in the next year? N/A    Would the patient like to discuss contraceptive options today? N/A      Contraception Wrap Up   Current Method Abstinence   hyst   End Method Abstinence   hyst   Contraception Counseling Provided No            Examination chaperoned by 08/22/21 LPN  Assessment:     1. Encounter for pessary maintenance Milex ring #2 with support,   2. S/P hysterectomy   3. Vaginal discharge Will rx Metrogel to use for 3-4 nights and then prn Meds ordered this encounter  Medications   metroNIDAZOLE (METROGEL VAGINAL) 0.75 % vaginal gel    Sig: Place 1 Applicatorful vaginally at bedtime.    Dispense:  70 g    Refill:  0    Order Specific Question:   Supervising Provider    Answer:    Malachy Mood [[id number]]       Plan:     Return in 4 months for pessary maintenance or sooner if needed

## 2021-08-21 ENCOUNTER — Ambulatory Visit (INDEPENDENT_AMBULATORY_CARE_PROVIDER_SITE_OTHER): Payer: Medicare HMO

## 2021-08-21 DIAGNOSIS — Z0000 Encounter for general adult medical examination without abnormal findings: Secondary | ICD-10-CM | POA: Diagnosis not present

## 2021-08-21 NOTE — Patient Instructions (Signed)
Health Maintenance, Female Adopting a healthy lifestyle and getting preventive care are important in promoting health and wellness. Ask your health care provider about: The right schedule for you to have regular tests and exams. Things you can do on your own to prevent diseases and keep yourself healthy. What should I know about diet, weight, and exercise? Eat a healthy diet  Eat a diet that includes plenty of vegetables, fruits, low-fat dairy products, and lean protein. Do not eat a lot of foods that are high in solid fats, added sugars, or sodium. Maintain a healthy weight Body mass index (BMI) is used to identify weight problems. It estimates body fat based on height and weight. Your health care provider can help determine your BMI and help you achieve or maintain a healthy weight. Get regular exercise Get regular exercise. This is one of the most important things you can do for your health. Most adults should: Exercise for at least 150 minutes each week. The exercise should increase your heart rate and make you sweat (moderate-intensity exercise). Do strengthening exercises at least twice a week. This is in addition to the moderate-intensity exercise. Spend less time sitting. Even light physical activity can be beneficial. Watch cholesterol and blood lipids Have your blood tested for lipids and cholesterol at 77 years of age, then have this test every 5 years. Have your cholesterol levels checked more often if: Your lipid or cholesterol levels are high. You are older than 77 years of age. You are at high risk for heart disease. What should I know about cancer screening? Depending on your health history and family history, you may need to have cancer screening at various ages. This may include screening for: Breast cancer. Cervical cancer. Colorectal cancer. Skin cancer. Lung cancer. What should I know about heart disease, diabetes, and high blood pressure? Blood pressure and heart  disease High blood pressure causes heart disease and increases the risk of stroke. This is more likely to develop in people who have high blood pressure readings, are of African descent, or are overweight. Have your blood pressure checked: Every 3-5 years if you are 18-39 years of age. Every year if you are 40 years old or older. Diabetes Have regular diabetes screenings. This checks your fasting blood sugar level. Have the screening done: Once every three years after age 40 if you are at a normal weight and have a low risk for diabetes. More often and at a younger age if you are overweight or have a high risk for diabetes. What should I know about preventing infection? Hepatitis B If you have a higher risk for hepatitis B, you should be screened for this virus. Talk with your health care provider to find out if you are at risk for hepatitis B infection. Hepatitis C Testing is recommended for: Everyone born from 1945 through 1965. Anyone with known risk factors for hepatitis C. Sexually transmitted infections (STIs) Get screened for STIs, including gonorrhea and chlamydia, if: You are sexually active and are younger than 77 years of age. You are older than 77 years of age and your health care provider tells you that you are at risk for this type of infection. Your sexual activity has changed since you were last screened, and you are at increased risk for chlamydia or gonorrhea. Ask your health care provider if you are at risk. Ask your health care provider about whether you are at high risk for HIV. Your health care provider may recommend a prescription medicine   to help prevent HIV infection. If you choose to take medicine to prevent HIV, you should first get tested for HIV. You should then be tested every 3 months for as long as you are taking the medicine. Pregnancy If you are about to stop having your period (premenopausal) and you may become pregnant, seek counseling before you get  pregnant. Take 400 to 800 micrograms (mcg) of folic acid every day if you become pregnant. Ask for birth control (contraception) if you want to prevent pregnancy. Osteoporosis and menopause Osteoporosis is a disease in which the bones lose minerals and strength with aging. This can result in bone fractures. If you are 65 years old or older, or if you are at risk for osteoporosis and fractures, ask your health care provider if you should: Be screened for bone loss. Take a calcium or vitamin D supplement to lower your risk of fractures. Be given hormone replacement therapy (HRT) to treat symptoms of menopause. Follow these instructions at home: Lifestyle Do not use any products that contain nicotine or tobacco, such as cigarettes, e-cigarettes, and chewing tobacco. If you need help quitting, ask your health care provider. Do not use street drugs. Do not share needles. Ask your health care provider for help if you need support or information about quitting drugs. Alcohol use Do not drink alcohol if: Your health care provider tells you not to drink. You are pregnant, may be pregnant, or are planning to become pregnant. If you drink alcohol: Limit how much you use to 0-1 drink a day. Limit intake if you are breastfeeding. Be aware of how much alcohol is in your drink. In the [place] one drink equals one 12 oz bottle of beer (355 mL), one 5 oz glass of wine (148 mL), or one 1 oz glass of hard liquor (44 mL). General instructions Schedule regular health, dental, and eye exams. Stay current with your vaccines. Tell your health care provider if: You often feel depressed. You have ever been abused or do not feel safe at home. Summary Adopting a healthy lifestyle and getting preventive care are important in promoting health and wellness. Follow your health care provider's instructions about healthy diet, exercising, and getting tested or screened for diseases. Follow your health care provider's  instructions on monitoring your cholesterol and blood pressure. This information is not intended to replace advice given to you by your health care provider. Make sure you discuss any questions you have with your health care provider. Document Revised: 01/19/2021 Document Reviewed: 11/04/2018 [employer]vier Patient Education  2022 [employer]  

## 2021-08-21 NOTE — Progress Notes (Signed)
Subjective:   Colleen Acevedo is a 75 y.o. female who presents for an Initial Medicare Annual Wellness Visit. I connected with  Colleen Acevedo on 08/21/21 by a audio enabled telemedicine application and verified that I am speaking with the correct person using two identifiers.  Patient Location: Home  Provider Location: Home Office  I discussed the limitations of evaluation and management by telemedicine. The patient expressed understanding and agreed to proceed.   Review of Systems    Defer to PCP       Objective:    There were no vitals filed for this visit. There is no height or weight on file to calculate BMI.  No flowsheet data found.  Current Medications (verified) Outpatient Encounter Medications as of 08/21/2021  Medication Sig   calcium carbonate (OSCAL) 1500 (600 Ca) MG TABS tablet Take by mouth 2 (two) times daily with a meal.   Calcium Carbonate-Vitamin D (CALCIUM 500 + D) 500-125 MG-UNIT TABS Take by mouth.   carbidopa-levodopa (SINEMET IR) 25-100 MG tablet TAKE 2 TABLETS 4  TIMES DAILY.   Chromium Picolinate 500 MCG CAPS Take by mouth.   CRANBERRY PO Take by mouth.   estradiol (ESTRACE) 0.1 MG/GM vaginal cream Place 1 Applicatorful vaginally. Every other day   loratadine (CLARITIN) 10 MG tablet Take 10 mg by mouth daily.   metroNIDAZOLE (METROGEL VAGINAL) 0.75 % vaginal gel Place 1 Applicatorful vaginally at bedtime.   omeprazole (PRILOSEC) 40 MG capsule Take 1 capsule (40 mg total) by mouth daily.   OVER THE COUNTER MEDICATION Velbet El energy antler 250 mg   pregabalin (LYRICA) 50 MG capsule Take 1 capsule (50 mg total) by mouth 3 (three) times daily.   sertraline (ZOLOFT) 25 MG tablet TAKE 2 TABLETS EVERY DAY   simvastatin (ZOCOR) 40 MG tablet Take 1 tablet (40 mg total) by mouth daily.   solifenacin (VESICARE) 10 MG tablet 1 tablet nightly   vitamin B-12 (CYANOCOBALAMIN) 100 MCG tablet Take 100 mcg by mouth daily.   ciprofloxacin (CIPRO) 100 MG tablet  Take 100 mg by mouth 2 (two) times daily. (Patient not taking: Reported on 08/21/2021)   No facility-administered encounter medications on file as of 08/21/2021.    Allergies (verified) Amoxicillin   History: Past Medical History:  Diagnosis Date   Anxiety disorder    Mixed hyperlipidemia    Parkinson's disease (HCC)    Urinary incontinence    uses pessary   Past Surgical History:  Procedure Laterality Date   BACK SURGERY     BREAST BIOPSY Right    KNEE SURGERY     VAGINAL HYSTERECTOMY     Family History  Problem Relation Age of Onset   Colon cancer Father    Diabetes Mother    Heart attack Brother    Colon cancer Sister    Breast cancer Sister    Heart attack Brother    Thyroid disease Daughter    Social History   Socioeconomic History   Marital status: Married    Spouse name: Not on file   Number of children: 2   Years of education: Not on file   Highest education level: Not on file  Occupational History    Comment: retired  Tobacco Use   Smoking status: Never   Smokeless tobacco: Never  Vaping Use   Vaping Use: Never used  Substance and Sexual Activity   Alcohol use: Never   Drug use: Never   Sexual activity: Not Currently    Birth control/protection:  Surgical    Comment: hyst  Other Topics Concern   Not on file  Social History Narrative   Not on file   Social Determinants of Health   Financial Resource Strain: Low Risk    Difficulty of Paying Living Expenses: Not hard at all  Food Insecurity: No Food Insecurity   Worried About Running Out of Food in the Last Year: Never true   Ran Out of Food in the Last Year: Never true  Transportation Needs: No Transportation Needs   Lack of Transportation (Medical): No   Lack of Transportation (Non-Medical): No  Physical Activity: Insufficiently Active   Days of Exercise per Week: 2 days   Minutes of Exercise per Session: 30 min  Stress: No Stress Concern Present   Feeling of Stress : Not at all  Social  Connections: Socially Isolated   Frequency of Communication with Friends and Family: More than three times a week   Frequency of Social Gatherings with Friends and Family: More than three times a week   Attends Religious Services: Never   Database administrator or Organizations: No   Attends Engineer, structural: Never   Marital Status: Separated    Tobacco Counseling Counseling given: Not Answered   Clinical Intake:  Pre-visit preparation completed: Yes        Diabetes: No  How often do you need to have someone help you when you read instructions, pamphlets, or other written materials from your doctor or pharmacy?: 1 - Never What is the last grade level you completed in school?: 11TH GRADE  Diabetic?NO  Interpreter Needed?: No  Information entered by :: Adleigh Mcmasters J,CMA   Activities of Daily Living In your present state of health, do you have any difficulty performing the following activities: 08/21/2021  Hearing? Y  Vision? N  Difficulty concentrating or making decisions? N  Walking or climbing stairs? Y  Dressing or bathing? N  Doing errands, shopping? N  Preparing Food and eating ? N  Using the Toilet? N  In the past six months, have you accidently leaked urine? Y  Do you have problems with loss of bowel control? N  Managing your Medications? N  Managing your Finances? N  Housekeeping or managing your Housekeeping? N  Some recent data might be hidden    Patient Care Team: Heather Roberts, NP as PCP - General (Nurse Practitioner)  Indicate any recent Medical Services you may have received from other than [facility] providers in the past year (date may be approximate).     Assessment:   This is a routine wellness examination for [name].  Hearing/Vision screen No results found.  Dietary issues and exercise activities discussed: Current Exercise Habits: The patient does not participate in regular exercise at present   Goals Addressed   None     Depression Screen PHQ 2/9 Scores 08/21/2021 07/09/2021 05/10/2021 01/10/2021 12/05/2020 11/01/2020 07/20/2019  PHQ - 2 Score 0 0 0 2 0 0 0  PHQ- 9 Score - - - 3 - - -    Fall Risk Fall Risk  08/20/2021 07/09/2021 05/10/2021 04/19/2021 01/10/2021  Falls in the past year? 1 1 1 1  0  Number falls in past yr: 1 1 0 0 0  Injury with Fall? 0 0 0 0 0  Risk for fall due to : - Impaired balance/gait Impaired balance/gait - No Fall Risks  Follow up - Falls evaluation completed Falls evaluation completed - Falls evaluation completed    FALL RISK PREVENTION  PERTAINING TO THE HOME:  Any stairs in or around the home? No  If so, are there any without handrails? No  Home free of loose throw rugs in walkways, pet beds, electrical cords, etc? Yes  Adequate lighting in your home to reduce risk of falls? Yes   ASSISTIVE DEVICES UTILIZED TO PREVENT FALLS:  Life alert? No  Use of a cane, walker or w/c? No  Grab bars in the bathroom? No  Shower chair or bench in shower? Yes  Elevated toilet seat or a handicapped toilet? No   TIMED UP AND GO:  Was the test performed?  N/A .  Length of time to ambulate 10 feet: N/A sec.     Cognitive Function:     6CIT Screen 08/21/2021  What Year? 0 points  What month? 0 points  What time? 0 points  Count back from 20 0 points  Months in reverse 0 points  Repeat phrase 0 points  Total Score 0    Immunizations Immunization History  Administered Date(s) Administered   Influenza-Unspecified 09/27/2020   Moderna Sars-Covid-2 Vaccination 12/07/2019, 01/17/2020, 09/18/2020    TDAP status: Due, Education has been provided regarding the importance of this vaccine. Advised may receive this vaccine at local pharmacy or Health Dept. Aware to provide a copy of the vaccination record if obtained from local pharmacy or Health Dept. Verbalized acceptance and understanding.  Flu Vaccine status: Due, Education has been provided regarding the importance of this vaccine.  Advised may receive this vaccine at local pharmacy or Health Dept. Aware to provide a copy of the vaccination record if obtained from local pharmacy or Health Dept. Verbalized acceptance and understanding.    Covid-19 vaccine status: Information provided on how to obtain vaccines.   Qualifies for Shingles Vaccine? Yes   Zostavax completed No   Shingrix Completed?: No.    Education has been provided regarding the importance of this vaccine. Patient has been advised to call insurance company to determine out of pocket expense if they have not yet received this vaccine. Advised may also receive vaccine at local pharmacy or Health Dept. Verbalized acceptance and understanding.  Screening Tests Health Maintenance  Topic Date Due   Zoster Vaccines- Shingrix (1 of 2) Never done   COVID-19 Vaccine (4 - Booster for Moderna series) 01/19/2021   INFLUENZA VACCINE  06/25/2021   TETANUS/TDAP  07/09/2022 (Originally 09/08/1965)   DEXA SCAN  Completed   Hepatitis C Screening  Completed   HPV VACCINES  Aged Out    Health Maintenance  Health Maintenance Due  Topic Date Due   Zoster Vaccines- Shingrix (1 of 2) Never done   COVID-19 Vaccine (4 - Booster for Moderna series) 01/19/2021   INFLUENZA VACCINE  06/25/2021    Colorectal cancer screening: No longer required.   Mammogram status: No longer required due to age.  Bone Density status: Completed 04/16/2021. Results reflect: Bone density results: OSTEOPENIA. Repeat every 2 years.  Lung Cancer Screening: (Low Dose CT Chest recommended if Age 3-80 years, 30 pack-year currently smoking OR have quit w/in 15years.) does not qualify.   Lung Cancer Screening Referral: no  Additional Screening:  Hepatitis C Screening: does qualify; Completed 12/07/2020  Vision Screening: Recommended annual ophthalmology exams for early detection of glaucoma and other disorders of the eye. Is the patient up to date with their annual eye exam?  Yes  Who is the  provider or what is the name of the office in which the patient attends annual eye exams?  [facility] If pt is not established with a provider, would they like to be referred to a provider to establish care? No .   Dental Screening: Recommended annual dental exams for proper oral hygiene  Community Resource Referral / Chronic Care Management: CRR required this visit?  No   CCM required this visit?  No      Plan:     I have personally reviewed and noted the following in the patient's chart:   Medical and social history Use of alcohol, tobacco or illicit drugs  Current medications and supplements including opioid prescriptions. Patient is not currently taking opioid prescriptions. Functional ability and status Nutritional status Physical activity Advanced directives List of other physicians Hospitalizations, surgeries, and ER visits in previous 12 months Vitals Screenings to include cognitive, depression, and falls Referrals and appointments  In addition, I have reviewed and discussed with patient certain preventive protocols, quality metrics, and best practice recommendations. A written personalized care plan for preventive services as well as general preventive health recommendations were provided to patient.     Victorio Palm, [facility]   08/21/2021   Nurse Notes: Non Face to Face 30 minute visit    Ms. Fleece , Thank you for taking time to come for your Medicare Wellness Visit. I appreciate your ongoing commitment to your health goals. Please review the following plan we discussed and let me know if I can assist you in the future.   These are the goals we discussed:  Goals   None     This is a list of the screening recommended for you and due dates:  Health Maintenance  Topic Date Due   Zoster (Shingles) Vaccine (1 of 2) Never done   COVID-19 Vaccine (4 - Booster for Moderna series) 01/19/2021   Flu Shot  06/25/2021   Tetanus Vaccine  07/09/2022*   DEXA scan  (bone density measurement)  Completed   Hepatitis C Screening: USPSTF Recommendation to screen - Ages 63-79 yo.  Completed   HPV Vaccine  Aged Out  *Topic was postponed. The date shown is not the original due date.

## 2021-08-22 ENCOUNTER — Telehealth: Payer: Self-pay | Admitting: *Deleted

## 2021-08-22 ENCOUNTER — Telehealth: Payer: Self-pay | Admitting: Adult Health

## 2021-08-22 NOTE — Telephone Encounter (Signed)
Returned pt's call. Two identifiers used. Explained to pt the importance of taking new med prescribed and she could get some financial assistance through [employer]. She stated that she will get her daughter to help her.

## 2021-08-22 NOTE — Telephone Encounter (Signed)
Pt called, states cream we ordered was too expensive, is there something else we can order  Pt states she already has Estradiol cream at home     [facility] [address]

## 2021-08-22 NOTE — Telephone Encounter (Signed)
Called pharmacy to see if insurance had alternatives for Metronidazole cream.  States even with GoodRx card it is $80.  Discussed with provider and will give sample of Nuvessa for patient to use.  Pt verbalized understanding and gratitude.  Will pickup from front desk at her convenience.

## 2021-09-25 DIAGNOSIS — R0981 Nasal congestion: Secondary | ICD-10-CM | POA: Diagnosis not present

## 2021-09-25 DIAGNOSIS — N3001 Acute cystitis with hematuria: Secondary | ICD-10-CM | POA: Diagnosis not present

## 2021-09-25 DIAGNOSIS — J029 Acute pharyngitis, unspecified: Secondary | ICD-10-CM | POA: Diagnosis not present

## 2021-09-25 DIAGNOSIS — R059 Cough, unspecified: Secondary | ICD-10-CM | POA: Diagnosis not present

## 2021-09-25 DIAGNOSIS — R350 Frequency of micturition: Secondary | ICD-10-CM | POA: Diagnosis not present

## 2021-09-25 DIAGNOSIS — J050 Acute obstructive laryngitis [croup]: Secondary | ICD-10-CM | POA: Diagnosis not present

## 2021-09-26 ENCOUNTER — Telehealth: Payer: Medicare HMO | Admitting: Internal Medicine

## 2021-09-26 ENCOUNTER — Other Ambulatory Visit: Payer: Self-pay

## 2021-10-05 ENCOUNTER — Telehealth: Payer: Self-pay | Admitting: Adult Health

## 2021-10-05 NOTE — Telephone Encounter (Signed)
Pt has recently been treated for a UTI . Pt finished med and is still having pain. Jenn won't treat without a culture. Pt was advised to go back to Urgent Care and ask them to do a urine culture. Pt asked if she could come in Monday but pt was advised not to wait over the weekend. Pt voiced understanding. JSY

## 2021-10-05 NOTE — Telephone Encounter (Signed)
Patient called stating that she went to urgent care because she had a bad cold and a UTI, patient states that she has finished her medication for UTI but she is still having pain. Patient would like to know if Colleen Acevedo could call her in something to the [facility] pharmacy on [address]. Please contact pt

## 2021-10-09 ENCOUNTER — Other Ambulatory Visit: Payer: Medicare HMO

## 2021-10-11 ENCOUNTER — Other Ambulatory Visit (INDEPENDENT_AMBULATORY_CARE_PROVIDER_SITE_OTHER): Payer: Medicare HMO | Admitting: *Deleted

## 2021-10-11 ENCOUNTER — Other Ambulatory Visit: Payer: Self-pay

## 2021-10-11 ENCOUNTER — Ambulatory Visit (INDEPENDENT_AMBULATORY_CARE_PROVIDER_SITE_OTHER): Payer: Medicare HMO | Admitting: Gastroenterology

## 2021-10-11 DIAGNOSIS — R350 Frequency of micturition: Secondary | ICD-10-CM | POA: Diagnosis not present

## 2021-10-11 LAB — POCT URINALYSIS DIPSTICK
Glucose, UA: NEGATIVE
Ketones, UA: NEGATIVE
Nitrite, UA: NEGATIVE
Protein, UA: NEGATIVE

## 2021-10-11 NOTE — Progress Notes (Signed)
Chart reviewed for nurse visit. Agree with plan of care.  Adline Potter, NP 10/11/2021 1:06 PM

## 2021-10-11 NOTE — Progress Notes (Signed)
   NURSE VISIT- UTI SYMPTOMS   SUBJECTIVE:  Colleen Acevedo is a 75 y.o. [id number] female here for UTI symptoms. She is a GYN patient. She reports  urinary frequency and forgetfulness .  OBJECTIVE:  There were no vitals taken for this visit.  Appears well, in no apparent distress  Results for orders placed or performed in visit on 10/11/21 (from the past 24 hour(s))  POCT Urinalysis Dipstick   Collection Time: 10/11/21 11:44 AM  Result Value Ref Range   Color, UA     Clarity, UA     Glucose, UA Negative Negative   Bilirubin, UA     Ketones, UA neg    Spec Grav, UA     Blood, UA trace    pH, UA     Protein, UA Negative Negative   Urobilinogen, UA     Nitrite, UA neg    Leukocytes, UA Moderate (2+) (A) Negative   Appearance     Odor      ASSESSMENT: GYN patient with UTI symptoms and negative nitrites  PLAN: Discussed with Cyril Mourning, AGNP   Rx sent by provider today: NO Urine culture sent Call or return to clinic prn if these symptoms worsen or fail to improve as anticipated. Follow-up: as needed   Malachy Mood  10/11/2021 12:07 PM

## 2021-10-12 LAB — URINALYSIS, ROUTINE W REFLEX MICROSCOPIC
Bilirubin, UA: NEGATIVE
Glucose, UA: NEGATIVE
Ketones, UA: NEGATIVE
Nitrite, UA: NEGATIVE
Protein,UA: NEGATIVE
RBC, UA: NEGATIVE
Specific Gravity, UA: 1.007 (ref 1.005–1.030)
Urobilinogen, Ur: 0.2 mg/dL (ref 0.2–1.0)
pH, UA: 6.5 (ref 5.0–7.5)

## 2021-10-12 LAB — MICROSCOPIC EXAMINATION
Casts: NONE SEEN /lpf
RBC, Urine: NONE SEEN /hpf (ref 0–2)

## 2021-10-15 ENCOUNTER — Telehealth: Payer: Self-pay | Admitting: Adult Health

## 2021-10-15 ENCOUNTER — Telehealth: Payer: Self-pay

## 2021-10-15 LAB — URINE CULTURE

## 2021-10-15 NOTE — Telephone Encounter (Signed)
Called pt, no answer, left vm relaying test result.

## 2021-10-15 NOTE — Telephone Encounter (Signed)
Returned pt's call. Two identifiers used. Relayed urine cx results. Pt confirmed understanding.

## 2021-10-15 NOTE — Telephone Encounter (Signed)
-----   Message from Adline Potter, NP sent at 10/15/2021 11:44 AM EST ----- Let pt know no growth on urine

## 2021-10-15 NOTE — Telephone Encounter (Signed)
Patient was calling about her lab results. 

## 2021-10-24 ENCOUNTER — Encounter: Payer: Self-pay | Admitting: Neurology

## 2021-10-24 ENCOUNTER — Ambulatory Visit: Payer: Medicare HMO | Admitting: Neurology

## 2021-10-24 VITALS — BP 143/69 | HR 73 | Ht 63.0 in | Wt 143.0 lb

## 2021-10-24 DIAGNOSIS — G20 Parkinson's disease: Secondary | ICD-10-CM | POA: Diagnosis not present

## 2021-10-24 DIAGNOSIS — M25562 Pain in left knee: Secondary | ICD-10-CM | POA: Diagnosis not present

## 2021-10-24 DIAGNOSIS — G8929 Other chronic pain: Secondary | ICD-10-CM

## 2021-10-24 NOTE — Progress Notes (Signed)
Subjective:    Patient ID: Colleen Acevedo is a 75 y.o. female.  HPI    Interim history:   Colleen Acevedo is a 75 year old right-handed woman right-handed woman with an underlying medical history of hyperlipidemia, anxiety, who presents for follow-up consultation of her Parkinson's disease.  The patient is unaccompanied today. I last saw her on 04/11/2021, at which time she reported significant stress.  She had moved in with her daughter who lives next-door, as the patient had separated from her husband.  Her exam was quite stable.  She was advised to continue with generic Sinemet 2 pills 4 times a day.  Today, 10/24/2021: She reports that she is doing fairly when it comes to her motor function.  She still has a lot of stress.  She is staying with her daughter.  She has another daughter in [place].  She also has a sister in [place].  She has not had much in the way of new symptoms but feels that her memory is not as good, her daughter has noticed that she tends to ask the same question or forget a conversation or parts of it.  She is trying to write more things down for reference.  For the past few months she has had left knee swelling and pain.  She does not use a walking aid.  She has not seen her primary care for this or orthopedics. She has a physical pending for next month.   The patient's allergies, current medications, family history, past medical history, past social history, past surgical history and problem list were reviewed and updated as appropriate.    Previously:   I saw her on 09/05/2020, at which time she was fairly stable, had no recent falls.  She was advised to follow-up routinely in 6 months.     I saw her on 03/06/2020, at which time she was taking Sinemet 2 pills 4 times daily.  Her husband was worried about her unintentional weight loss.  This was ongoing over a few years.  She was advised to talk to her primary care physician about additional work-up for weight loss.  We talked about fall  prevention.  She had fallen twice since her previous visit.  She was advised to hydrate better and to be proactive about constipation issues.  She was advised to continue with Sinemet at the current dose.       I first met her on 11/04/2019, at which time she reported a diagnosis of Parkinson's disease since 2016.  She was on Sinemet, 2 pills in the morning, 3 midday and 2 in the evening.  She was advised change her regimen to 2 pills 4 times a day on a scheduled 4 hourly basis.     11/04/19: (She) was diagnosed with Parkinson's disease in 2016, while she was still residing in [place].  She has moved to [facility].  She was followed by a neurologist.  I was able to review some of her neurology records from [facility] in [place], [place].  I reviewed the office note from 03/17/2018, at which time the patient was deemed stable as far as her Parkinson's disease and was advised to continue with Sinemet 2 pills 3 times daily.  She was advised to increase it if needed to 2 pills 4 times a day.  I reviewed your office note from 08/11/2019, which you kindly included. She has been on Sinemet since 2016. She had blood work through your office on 08/06/2019 and I reviewed the results,  CBC with differential and platelets was unremarkable, CMP showed benign findings, lipid panel showed total cholesterol of 155, triglycerides 82, LDL 16. They moved at the end of June, she is originally from [place], they have a daughter in [place] and one in [place] and they moved to be closer to her.  Daughter lives next door and they have 2 grandchildren from her and also great-grandchildren.  The patient is a non-smoker and does not utilize alcohol and drinks caffeine and limitation, 1 cup of coffee in the morning typically.  She has not had any recent falls thankfully, she has had some intermittent issues with constipation, takes a probiotic and sometimes a laxative.  She admits that she does not drink a whole  lot of water, estimates that she drinks about 1 bottle of water per day on average.  She is physically quite active, no issues with memory, mood is stable.  She has no family history of Parkinson's disease. Her symptoms started towards the end of 2015, may be in December with a right hand tremor and feeling nervous.  She has not had a brain scan such as CT or MRI.She has been followed by Dr. Carleene Cooper. She currently takes Sinemet 2 pills at 7 AM, 3 pills at 12 and 2 pills at 8 PM.  She does take the medication often with her meals.   Her Past Medical History Is Significant For: Past Medical History:  Diagnosis Date   Anxiety disorder    Mixed hyperlipidemia    Parkinson's disease ([place])    Urinary incontinence    uses pessary    Her Past Surgical History Is Significant For: Past Surgical History:  Procedure Laterality Date   BACK SURGERY     BREAST BIOPSY Right    KNEE SURGERY     VAGINAL HYSTERECTOMY      Her Family History Is Significant For: Family History  Problem Relation Age of Onset   Colon cancer Father    Diabetes Mother    Heart attack Brother    Colon cancer Sister    Breast cancer Sister    Heart attack Brother    Thyroid disease Daughter     Her Social History Is Significant For: Social History   Socioeconomic History   Marital status: Married    Spouse name: Not on file   Number of children: 2   Years of education: Not on file   Highest education level: Not on file  Occupational History    Comment: retired  Tobacco Use   Smoking status: Never   Smokeless tobacco: Never  Vaping Use   Vaping Use: Never used  Substance and Sexual Activity   Alcohol use: Never   Drug use: Never   Sexual activity: Not Currently    Birth control/protection: Surgical    Comment: hyst  Other Topics Concern   Not on file  Social History Narrative   Not on file   Social Determinants of Health   Financial Resource Strain: Low Risk    Difficulty of Paying  Living Expenses: Not hard at all  Food Insecurity: No Food Insecurity   Worried About Charity fundraiser in the Last Year: Never true   [place] in the Last Year: Never true  Transportation Needs: No Transportation Needs   Lack of Transportation (Medical): No   Lack of Transportation (Non-Medical): No  Physical Activity: Insufficiently Active   Days of Exercise per Week: 2 days   Minutes of Exercise  per Session: 30 min  Stress: No Stress Concern Present   Feeling of Stress : Not at all  Social Connections: Socially Isolated   Frequency of Communication with Friends and Family: More than three times a week   Frequency of Social Gatherings with Friends and Family: More than three times a week   Attends Religious Services: Never   Marine scientist or Organizations: No   Attends Archivist Meetings: Never   Marital Status: Separated    Her Allergies Are:  Allergies  Allergen Reactions   Amoxicillin Itching and Rash  :   Her Current Medications Are:  Outpatient Encounter Medications as of 10/24/2021  Medication Sig   Calcium Carbonate-Vitamin D (CALCIUM 500 + D) 500-125 MG-UNIT TABS Take by mouth.   carbidopa-levodopa (SINEMET IR) 25-100 MG tablet TAKE 2 TABLETS 4  TIMES DAILY.   CRANBERRY PO Take by mouth.   estradiol (ESTRACE) 0.1 MG/GM vaginal cream Place 1 Applicatorful vaginally. Every other day   loratadine (CLARITIN) 10 MG tablet Take 10 mg by mouth daily.   metroNIDAZOLE (METROGEL VAGINAL) 0.75 % vaginal gel Place 1 Applicatorful vaginally at bedtime.   omeprazole (PRILOSEC) 40 MG capsule Take 1 capsule (40 mg total) by mouth daily.   OVER THE COUNTER MEDICATION Velbet El energy antler 250 mg   pregabalin (LYRICA) 50 MG capsule Take 1 capsule (50 mg total) by mouth 3 (three) times daily.   Propylene Glycol (SYSTANE COMPLETE OP) Apply to eye in the morning and at bedtime.   sertraline (ZOLOFT) 25 MG tablet TAKE 2 TABLETS EVERY DAY   simvastatin  (ZOCOR) 40 MG tablet Take 1 tablet (40 mg total) by mouth daily.   solifenacin (VESICARE) 10 MG tablet 1 tablet nightly   vitamin B-12 (CYANOCOBALAMIN) 100 MCG tablet Take 100 mcg by mouth daily.   No facility-administered encounter medications on file as of 10/24/2021.  :  Review of Systems:  Out of a complete 14 point review of systems, all are reviewed and negative with the exception of these symptoms as listed below:  Review of Systems  Neurological:        Patient presents today for PD follow up. She reports that her symptoms have improved slightly. She has less stress currently.   Objective:  Neurological Exam  Physical Exam Physical Examination:   Vitals:   10/24/21 1301  BP: (!) 143/69  Pulse: 73    General Examination: The patient is a very pleasant 75 y.o. female in no acute distress. She appears well-developed and well-nourished and well groomed.   HEENT: Normocephalic, atraumatic, pupils are round and reactive to light, extraocular tracking is mildly impaired, she wears corrective eyeglasses.  She has bilateral cataracts.  She has mild left ptosis and left pupil is a little larger than the right, stable. She has mild facial masking mild nuchal rigidity is noted, no lip, neck or jaw tremor, airway examination reveals mild mouth dryness, tongue protrudes centrally and palate elevates symmetrically, she has mild hypophonia. No sialorrhea.   Chest: Clear to auscultation without wheezing, rhonchi or crackles noted.   Heart: S1+S2+0, regular and normal without murmurs, rubs or gallops noted.    Abdomen: Soft, non-tender and non-distended.   Extremities: There is 2+ pitting edema in the left lower extremity, 1+ pitting edema in the right lower extremity.     Skin: Warm and dry without trophic changes noted.   Musculoskeletal: exam reveals left knee pain and swelling.     Neurologically:  Mental  status: The patient is awake, alert and oriented in all 4 spheres. Her  immediate and remote memory, attention, language skills and fund of knowledge are appropriate. There is no evidence of aphasia, agnosia, apraxia or anomia. Speech is clear with normal prosody and enunciation. Thought process is linear. Mood is normal and affect is normal.  Cranial nerves II - XII are as described above under HEENT exam.    (On 11/04/2019: On Archimedes spiral drawing she has no tremor with the right hand, fairly good spiral, left hand is insecure, handwriting is legible, not particularly tremulous, slightly on the smaller side.)   Motor exam: Overall, thin bulk, global strength of 4/5, mild intermittent right upper extremity tremor noted at rest.  She has slight increase in tone with cogwheeling noted in the right upper extremity, otherwise good tone. Fine motor skills with finger taps and foot agility, she has mild impairment on the right side, slightly abnormal findings on the left.  Sensory exam is intact to light touch.  Cerebellar testing shows no dysmetria or intention tremor. Gait, station and balance: She stands without major difficulty, posture is slightly stooped for age, she walks with decreased stride length and decreased pace, decreased arm swing bilaterally, more so on the R.  Turns well, balance is fairly well preserved.   Assessment and Plan:    In summary, Giuseppina Quinones is a very pleasant 75 year old female with an underlying medical history of hyperlipidemia, and anxiety, who presents for follow-up consultation of her Parkinson's disease with right-sided predominance. She has responded to levodopa therapy and is currently taking 2 pills 4 times a day on a 4 hourly schedule, starting at 7:30 AM.  We talked about fall risk and the importance of fall prevention.  She is advised to see her primary care regarding her left knee pain and swelling, she may benefit from seeing an orthopedic specialist.  We talked about the importance of stress reduction.  She has a good  psychosocial support system.  We talked about the importance of healthy lifestyle, good nutrition, good hydration, staying active mentally and physically.  She reports mild forgetfulness but things are stable, we will continue to monitor.   She is overall feeling stable on the Sinemet and we mutually agreed to keep her at the current dose. I recommended a follow-up routinely in about 6 months, sooner if needed. I answered all her questions today and the patient was in agreement. I spent 30 minutes in total face-to-face time and in reviewing records during pre-charting, more than 50% of which was spent in counseling and coordination of care, reviewing test results, reviewing medications and treatment regimen and/or in discussing or reviewing the diagnosis of PD, the prognosis and treatment options. Pertinent laboratory and imaging test results that were available during this visit with the patient were reviewed by me and considered in my medical decision making (see chart for details).

## 2021-10-24 NOTE — Patient Instructions (Addendum)
It was nice to see you again. I hope, your stress will get less, hang in there! Your exam is fairly stable with the exception of swelling and discomfort and decreased range of motion in your left knee.  Please have this checked out with your primary care and probably also with orthopedics. Please continue with your Sinemet 2 pills 4 times daily.  Your prescription is up-to-date.  Follow-up in 6 months.

## 2021-11-05 ENCOUNTER — Other Ambulatory Visit: Payer: Self-pay | Admitting: Nurse Practitioner

## 2021-11-05 ENCOUNTER — Telehealth: Payer: Self-pay

## 2021-11-05 ENCOUNTER — Other Ambulatory Visit: Payer: Self-pay

## 2021-11-05 DIAGNOSIS — K219 Gastro-esophageal reflux disease without esophagitis: Secondary | ICD-10-CM

## 2021-11-05 DIAGNOSIS — G5793 Unspecified mononeuropathy of bilateral lower limbs: Secondary | ICD-10-CM

## 2021-11-05 MED ORDER — PREGABALIN 50 MG PO CAPS: 50.0000 mg | ORAL_CAPSULE | Freq: Three times a day (TID) | ORAL | 1 refills | Status: DC

## 2021-11-05 MED ORDER — OMEPRAZOLE 40 MG PO CPDR: 40.0000 mg | DELAYED_RELEASE_CAPSULE | Freq: Every day | ORAL | 3 refills | Status: DC

## 2021-11-05 NOTE — Telephone Encounter (Signed)
error 

## 2021-11-05 NOTE — Telephone Encounter (Signed)
Please call pt regarding rx being sent in to mail order.

## 2021-11-05 NOTE — Telephone Encounter (Signed)
Sent to mail order

## 2021-11-09 ENCOUNTER — Ambulatory Visit: Payer: Medicare HMO

## 2021-11-09 ENCOUNTER — Other Ambulatory Visit: Payer: Self-pay

## 2021-11-09 ENCOUNTER — Encounter: Payer: Self-pay | Admitting: Nurse Practitioner

## 2021-11-09 ENCOUNTER — Ambulatory Visit (INDEPENDENT_AMBULATORY_CARE_PROVIDER_SITE_OTHER): Payer: Medicare HMO | Admitting: Nurse Practitioner

## 2021-11-09 VITALS — BP 134/80 | HR 74 | Ht 62.0 in | Wt 146.1 lb

## 2021-11-09 DIAGNOSIS — M25562 Pain in left knee: Secondary | ICD-10-CM

## 2021-11-09 DIAGNOSIS — R350 Frequency of micturition: Secondary | ICD-10-CM | POA: Diagnosis not present

## 2021-11-09 DIAGNOSIS — E538 Deficiency of other specified B group vitamins: Secondary | ICD-10-CM | POA: Diagnosis not present

## 2021-11-09 DIAGNOSIS — E785 Hyperlipidemia, unspecified: Secondary | ICD-10-CM | POA: Diagnosis not present

## 2021-11-09 DIAGNOSIS — G5793 Unspecified mononeuropathy of bilateral lower limbs: Secondary | ICD-10-CM | POA: Diagnosis not present

## 2021-11-09 DIAGNOSIS — N390 Urinary tract infection, site not specified: Secondary | ICD-10-CM | POA: Diagnosis not present

## 2021-11-09 LAB — POCT URINALYSIS DIPSTICK
Bilirubin, UA: NEGATIVE
Glucose, UA: NEGATIVE
Ketones, UA: NEGATIVE
Nitrite, UA: NEGATIVE
Protein, UA: NEGATIVE
Spec Grav, UA: 1.02 (ref 1.010–1.025)
Urobilinogen, UA: 0.2 E.U./dL
pH, UA: 6.5 (ref 5.0–8.0)

## 2021-11-09 MED ORDER — SULFAMETHOXAZOLE-TRIMETHOPRIM 800-160 MG PO TABS: 1.0000 | ORAL_TABLET | Freq: Two times a day (BID) | ORAL | 0 refills | Status: DC

## 2021-11-09 NOTE — Assessment & Plan Note (Addendum)
-  U/A positive for UTI -send urine for culture -Rx. Bactrim -she states she went to urgent care recently, but no notes available or old culture to review -referral to urology since she has been having recurrent UTIs and has pessary; may have a mechanical issue

## 2021-11-09 NOTE — Progress Notes (Signed)
Acute Office Visit  Subjective:    Patient ID: Colleen Acevedo, female    DOB: 06-May-1946, 75 y.o.   MRN: [id number]  Chief Complaint  Patient presents with   Follow-up    L leg pain x 2 months,possible uti     HPI Patient is in today for lab follow-up, and she reports she has been having leg pain x2 months and is having UTI symptoms.  She noticed urinary issues a month ago. She has urinary frequency and urgency. She has a pessary, and she had a recent visit with [facility] for that (per the pt).  She states she fell down the steps at her sister's house in [place] in April of 2022, and she has noticed left knee swelling and pain down her left leg.  She is having dry mouth that started when she started taking vesicare. She would like to try a different medication for OAB. Past Medical History:  Diagnosis Date   Anxiety disorder    Mixed hyperlipidemia    Parkinson's disease ([place])    Urinary incontinence    uses pessary    Past Surgical History:  Procedure Laterality Date   BACK SURGERY     BREAST BIOPSY Right    KNEE SURGERY     VAGINAL HYSTERECTOMY      Family History  Problem Relation Age of Onset   Colon cancer Father    Diabetes Mother    Heart attack Brother    Colon cancer Sister    Breast cancer Sister    Heart attack Brother    Thyroid disease Daughter     Social History   Socioeconomic History   Marital status: Married    Spouse name: Not on file   Number of children: 2   Years of education: Not on file   Highest education level: Not on file  Occupational History    Comment: retired  Tobacco Use   Smoking status: Never   Smokeless tobacco: Never  Vaping Use   Vaping Use: Never used  Substance and Sexual Activity   Alcohol use: Never   Drug use: Never   Sexual activity: Not Currently    Birth control/protection: Surgical    Comment: hyst  Other Topics Concern   Not on file  Social History Narrative   Not on file   Social  Determinants of Health   Financial Resource Strain: Low Risk    Difficulty of Paying Living Expenses: Not hard at all  Food Insecurity: No Food Insecurity   Worried About Charity fundraiser in the Last Year: Never true   [place] in the Last Year: Never true  Transportation Needs: No Transportation Needs   Lack of Transportation (Medical): No   Lack of Transportation (Non-Medical): No  Physical Activity: Insufficiently Active   Days of Exercise per Week: 2 days   Minutes of Exercise per Session: 30 min  Stress: No Stress Concern Present   Feeling of Stress : Not at all  Social Connections: Socially Isolated   Frequency of Communication with Friends and Family: More than three times a week   Frequency of Social Gatherings with Friends and Family: More than three times a week   Attends Religious Services: Never   Marine scientist or Organizations: No   Attends Archivist Meetings: Never   Marital Status: Separated  Intimate Partner Violence: Not At Risk   Fear of Current or Ex-Partner: No   Emotionally Abused: No  Physically Abused: No   Sexually Abused: No    Outpatient Medications Prior to Visit  Medication Sig Dispense Refill   Calcium Carbonate-Vitamin D (CALCIUM 500 + D) 500-125 MG-UNIT TABS Take by mouth.     carbidopa-levodopa (SINEMET IR) 25-100 MG tablet TAKE 2 TABLETS 4  TIMES DAILY. 720 tablet 2   CRANBERRY PO Take by mouth.     estradiol (ESTRACE) 0.1 MG/GM vaginal cream Place 1 Applicatorful vaginally. Every other day     FLUAD QUADRIVALENT 0.5 ML injection      loratadine (CLARITIN) 10 MG tablet Take 10 mg by mouth daily.     metroNIDAZOLE (METROGEL VAGINAL) 0.75 % vaginal gel Place 1 Applicatorful vaginally at bedtime. 70 g 0   MODERNA COVID-19 BIVAL BOOSTER 50 MCG/0.5ML injection      omeprazole (PRILOSEC) 40 MG capsule Take 1 capsule (40 mg total) by mouth daily. 30 capsule 3   OVER THE COUNTER MEDICATION Velbet El energy antler 250 mg      pregabalin (LYRICA) 50 MG capsule Take 1 capsule (50 mg total) by mouth 3 (three) times daily. 270 capsule 1   Propylene Glycol (SYSTANE COMPLETE OP) Apply to eye in the morning and at bedtime.     sertraline (ZOLOFT) 25 MG tablet TAKE 2 TABLETS EVERY DAY 180 tablet 3   simvastatin (ZOCOR) 40 MG tablet Take 1 tablet (40 mg total) by mouth daily. 90 tablet 1   solifenacin (VESICARE) 10 MG tablet 1 tablet nightly 30 tablet 11   vitamin B-12 (CYANOCOBALAMIN) 100 MCG tablet Take 100 mcg by mouth daily.     No facility-administered medications prior to visit.    Allergies  Allergen Reactions   Amoxicillin Itching and Rash    Review of Systems  Constitutional: Negative.   Respiratory: Negative.    Cardiovascular: Negative.   Genitourinary:  Positive for frequency and urgency.  Musculoskeletal:  Positive for arthralgias and joint swelling.       Left knee      Objective:    Physical Exam Constitutional:      Appearance: Normal appearance.  Cardiovascular:     Rate and Rhythm: Normal rate and regular rhythm.     Pulses: Normal pulses.     Heart sounds: Normal heart sounds.  Pulmonary:     Effort: Pulmonary effort is normal.     Breath sounds: Normal breath sounds.  Musculoskeletal:        General: Swelling and tenderness present.     Comments: To left knee  Neurological:     Mental Status: She is alert.    BP 134/80    Pulse 74    Ht '5\' 2"'  (1.575 m)    Wt 146 lb 1.3 oz (66.3 kg)    SpO2 95%    BMI 26.72 kg/m  Wt Readings from Last 3 Encounters:  11/09/21 146 lb 1.3 oz (66.3 kg)  10/24/21 143 lb (64.9 kg)  08/20/21 140 lb 8 oz (63.7 kg)    Health Maintenance Due  Topic Date Due   Zoster Vaccines- Shingrix (1 of 2) Never done   Pneumonia Vaccine 56+ Years old (1 - PCV) Never done   COVID-19 Vaccine (4 - Booster for Moderna series) 11/13/2020    There are no preventive care reminders to display for this patient.   No results found for: TSH Lab Results  Component  Value Date   WBC 5.8 05/04/2021   HGB 15.2 05/04/2021   HCT 44.7 05/04/2021   MCV 90  05/04/2021   PLT 226 05/04/2021   Lab Results  Component Value Date   NA 142 05/04/2021   K 4.4 05/04/2021   CO2 26 05/04/2021   GLUCOSE 87 05/04/2021   BUN 21 05/04/2021   CREATININE 0.77 05/04/2021   BILITOT 0.6 05/04/2021   ALKPHOS 78 05/04/2021   AST 11 05/04/2021   ALT 6 05/04/2021   PROT 6.5 05/04/2021   ALBUMIN 4.3 05/04/2021   CALCIUM 9.7 05/04/2021   EGFR 80 05/04/2021   Lab Results  Component Value Date   CHOL 178 05/04/2021   Lab Results  Component Value Date   HDL 64 05/04/2021   Lab Results  Component Value Date   LDLCALC 100 (H) 05/04/2021   Lab Results  Component Value Date   TRIG 76 05/04/2021   No results found for: CHOLHDL No results found for: HGBA1C     Assessment & Plan:   Problem List Items Addressed This Visit       Nervous and Auditory   Neuropathy involving both lower extremities    -she has tried lyrica, but that isn't helping much -she will reach out to neurology; will send referral if one is needed      Relevant Orders   B12     Genitourinary   UTI (urinary tract infection)    -U/A positive for UTI -send urine for culture -Rx. Bactrim -she states she went to urgent care recently, but no notes available or old culture to review -referral to urology since she has been having recurrent UTIs and has pessary; may have a mechanical issue      Relevant Medications   MODERNA COVID-19 BIVAL BOOSTER 50 MCG/0.5ML injection   FLUAD QUADRIVALENT 0.5 ML injection   sulfamethoxazole-trimethoprim (BACTRIM DS) 800-160 MG tablet   Other Relevant Orders   Urine Culture   Ambulatory referral to Urology   CBC with Differential/Platelet   CMP14+EGFR     Other   Hyperlipidemia    -check lipid panel      Relevant Orders   CBC with Differential/Platelet   CMP14+EGFR   Lipid Panel With LDL/HDL Ratio   Left knee pain    -started in April after  falling down stairs at her Sister's -has effusion today -referral to ortho      Relevant Orders   Ambulatory referral to Orthopedic Surgery   Other Visit Diagnoses     Urinary frequency    -  Primary   Relevant Orders   POCT urinalysis dipstick (Completed)   B12 deficiency       Relevant Orders   B12        Meds ordered this encounter  Medications   sulfamethoxazole-trimethoprim (BACTRIM DS) 800-160 MG tablet    Sig: Take 1 tablet by mouth 2 (two) times daily.    Dispense:  10 tablet    Refill:  0     Noreene Larsson, NP

## 2021-11-09 NOTE — Assessment & Plan Note (Signed)
-  started in April after falling down stairs at her Sister's -has effusion today -referral to ortho

## 2021-11-09 NOTE — Patient Instructions (Signed)
Please have fasting labs drawn today. ° °I will be moving to [facility] located at [address], [place], [place] [address] effective Nov 25, 2021. °If you would like to establish care with [facility] please call [phone]. °

## 2021-11-09 NOTE — Assessment & Plan Note (Signed)
-  check lipid panel  

## 2021-11-09 NOTE — Assessment & Plan Note (Signed)
-  she has tried lyrica, but that isn't helping much -she will reach out to neurology; will send referral if one is needed

## 2021-11-10 LAB — CBC WITH DIFFERENTIAL/PLATELET
Basophils Absolute: 0 10*3/uL (ref 0.0–0.2)
Basos: 1 %
EOS (ABSOLUTE): 0.1 10*3/uL (ref 0.0–0.4)
Eos: 2 %
Hematocrit: 44.3 % (ref 34.0–46.6)
Hemoglobin: 14.8 g/dL (ref 11.1–15.9)
Immature Grans (Abs): 0.1 10*3/uL (ref 0.0–0.1)
Immature Granulocytes: 1 %
Lymphocytes Absolute: 1.1 10*3/uL (ref 0.7–3.1)
Lymphs: 18 %
MCH: 29.1 pg (ref 26.6–33.0)
MCHC: 33.4 g/dL (ref 31.5–35.7)
MCV: 87 fL (ref 79–97)
Monocytes Absolute: 0.5 10*3/uL (ref 0.1–0.9)
Monocytes: 8 %
Neutrophils Absolute: 4.4 10*3/uL (ref 1.4–7.0)
Neutrophils: 70 %
Platelets: 217 10*3/uL (ref 150–450)
RBC: 5.08 x10E6/uL (ref 3.77–5.28)
RDW: 13.1 % (ref 11.7–15.4)
WBC: 6.3 10*3/uL (ref 3.4–10.8)

## 2021-11-10 LAB — CMP14+EGFR
ALT: 3 IU/L (ref 0–32)
AST: 13 IU/L (ref 0–40)
Albumin/Globulin Ratio: 2 (ref 1.2–2.2)
Albumin: 4.4 g/dL (ref 3.7–4.7)
Alkaline Phosphatase: 94 IU/L (ref 44–121)
BUN/Creatinine Ratio: 29 — ABNORMAL HIGH (ref 12–28)
BUN: 22 mg/dL (ref 8–27)
Bilirubin Total: 0.5 mg/dL (ref 0.0–1.2)
CO2: 26 mmol/L (ref 20–29)
Calcium: 9.2 mg/dL (ref 8.7–10.3)
Chloride: 101 mmol/L (ref 96–106)
Creatinine, Ser: 0.77 mg/dL (ref 0.57–1.00)
Globulin, Total: 2.2 g/dL (ref 1.5–4.5)
Glucose: 83 mg/dL (ref 70–99)
Potassium: 4.7 mmol/L (ref 3.5–5.2)
Sodium: 140 mmol/L (ref 134–144)
Total Protein: 6.6 g/dL (ref 6.0–8.5)
eGFR: 79 mL/min/{1.73_m2} (ref >59)

## 2021-11-10 LAB — LIPID PANEL WITH LDL/HDL RATIO
Cholesterol, Total: 162 mg/dL (ref 100–199)
HDL: 54 mg/dL (ref >39)
LDL Chol Calc (NIH): 91 mg/dL (ref 0–99)
LDL/HDL Ratio: 1.7 ratio (ref 0.0–3.2)
Triglycerides: 90 mg/dL (ref 0–149)
VLDL Cholesterol Cal: 17 mg/dL (ref 5–40)

## 2021-11-10 LAB — VITAMIN B12: Vitamin B-12: 1973 pg/mL — ABNORMAL HIGH (ref 232–1245)

## 2021-11-12 NOTE — Progress Notes (Signed)
B12 is elevated, so she is absorbing this well and doesn't need injections.

## 2021-11-13 ENCOUNTER — Telehealth: Payer: Self-pay | Admitting: Internal Medicine

## 2021-11-13 NOTE — Telephone Encounter (Signed)
Pt called office and left voicemail to speak with provider.

## 2021-11-13 NOTE — Telephone Encounter (Signed)
This is not a patel pt please send to provider of pt

## 2021-11-14 ENCOUNTER — Encounter: Payer: Self-pay | Admitting: Orthopedic Surgery

## 2021-11-14 ENCOUNTER — Ambulatory Visit: Payer: Medicare HMO | Admitting: Orthopedic Surgery

## 2021-11-14 ENCOUNTER — Ambulatory Visit: Payer: Medicare HMO

## 2021-11-14 ENCOUNTER — Other Ambulatory Visit: Payer: Self-pay

## 2021-11-14 VITALS — BP 123/71 | HR 84 | Ht 63.0 in | Wt 142.0 lb

## 2021-11-14 DIAGNOSIS — M1712 Unilateral primary osteoarthritis, left knee: Secondary | ICD-10-CM

## 2021-11-14 DIAGNOSIS — M25562 Pain in left knee: Secondary | ICD-10-CM

## 2021-11-14 NOTE — Patient Instructions (Addendum)
Recommend you contact Colleen Acevedo if you want to stop Lyrica, in order to wean off of this medication    Instructions Following Joint Injections  In clinic today, you received an injection in one of your joints (sometimes more than one).  Occasionally, you can have some pain at the injection site, this is normal.  You can place ice at the injection site, or take over-the-counter medications such as Tylenol (acetaminophen) or Advil (ibuprofen).  Please follow all directions listed on the bottle.  If your joint (knee or shoulder) becomes swollen, red or very painful, please contact the clinic for additional assistance.   Two medications were injected, including lidocaine and a steroid (often referred to as cortisone).  Lidocaine is effective almost immediately but wears off quickly.  However, the steroid can take a few days to improve your symptoms.  In some cases, it can make your pain worse for a couple of days.  Do not be concerned if this happens as it is common.  You can apply ice or take some over-the-counter medications as needed.      Knee Exercises  Ask your health care provider which exercises are safe for you. Do exercises exactly as told by your health care provider and adjust them as directed. It is normal to feel mild stretching, pulling, tightness, or discomfort as you do these exercises. Stop right away if you feel sudden pain or your pain gets worse. Do not begin these exercises until told by your health care provider.  Stretching and range-of-motion exercises These exercises warm up your muscles and joints and improve the movement and flexibility of your knee. These exercises also help to relieve pain and swelling.  Knee extension, prone Lie on your abdomen (prone position) on a bed. Place your left / right knee just beyond the edge of the surface so your knee is not on the bed. You can put a towel under your left / right thigh just above your kneecap for comfort. Relax your  leg muscles and allow gravity to straighten your knee (extension). You should feel a stretch behind your left / right knee. Hold this position for 10 seconds. Scoot up so your knee is supported between repetitions. Repeat 10 times. Complete this exercise 3-4 times per week.     Knee flexion, active Lie on your back with both legs straight. If this causes back discomfort, bend your left / right knee so your foot is flat on the floor. Slowly slide your left / right heel back toward your buttocks. Stop when you feel a gentle stretch in the front of your knee or thigh (flexion). Hold this position for 10 seconds. Slowly slide your left / right heel back to the starting position. Repeat 10 times. Complete this exercise 3-4 times per week.      Quadriceps stretch, prone Lie on your abdomen on a firm surface, such as a bed or padded floor. Bend your left / right knee and hold your ankle. If you cannot reach your ankle or pant leg, loop a belt around your foot and grab the belt instead. Gently pull your heel toward your buttocks. Your knee should not slide out to the side. You should feel a stretch in the front of your thigh and knee (quadriceps). Hold this position for 10 seconds. Repeat 10 times. Complete this exercise 3-4 times per week.      Hamstring, supine Lie on your back (supine position). Loop a belt or towel over the ball of  your left / right foot. The ball of your foot is on the walking surface, right under your toes. Straighten your left / right knee and slowly pull on the belt to raise your leg until you feel a gentle stretch behind your knee (hamstring). Do not let your knee bend while you do this. Keep your other leg flat on the floor. Hold this position for 10 seconds. Repeat 10 times. Complete this exercise 3-4 times per week.   Strengthening exercises These exercises build strength and endurance in your knee. Endurance is the ability to use your muscles for a long time,  even after they get tired.  Quadriceps, isometric This exercise stretches the muscles in front of your thigh (quadriceps) without moving your knee joint (isometric). Lie on your back with your left / right leg extended and your other knee bent. Put a rolled towel or small pillow under your knee if told by your health care provider. Slowly tense the muscles in the front of your left / right thigh. You should see your kneecap slide up toward your hip or see increased dimpling just above the knee. This motion will push the back of the knee toward the floor. For 10 seconds, hold the muscle as tight as you can without increasing your pain. Relax the muscles slowly and completely. Repeat 10 times. Complete this exercise 3-4 times per week. .     Straight leg raises This exercise stretches the muscles in front of your thigh (quadriceps) and the muscles that move your hips (hip flexors). Lie on your back with your left / right leg extended and your other knee bent. Tense the muscles in the front of your left / right thigh. You should see your kneecap slide up or see increased dimpling just above the knee. Your thigh may even shake a bit. Keep these muscles tight as you raise your leg 4-6 inches (10-15 cm) off the floor. Do not let your knee bend. Hold this position for 10 seconds. Keep these muscles tense as you lower your leg. Relax your muscles slowly and completely after each repetition. Repeat 10 times. Complete this exercise 3-4 times per week.  Hamstring, isometric Lie on your back on a firm surface. Bend your left / right knee about 30 degrees. Dig your left / right heel into the surface as if you are trying to pull it toward your buttocks. Tighten the muscles in the back of your thighs (hamstring) to "dig" as hard as you can without increasing any pain. Hold this position for 10 seconds. Release the tension gradually and allow your muscles to relax completely for __________ seconds after  each repetition. Repeat 10 times. Complete this exercise 3-4 times per week.  Hamstring curls If told by your health care provider, do this exercise while wearing ankle weights. Begin with 5 lb weights. Then increase the weight by 1 lb (0.5 kg) increments. You can also use an exercise band Lie on your abdomen with your legs straight. Bend your left / right knee as far as you can without feeling pain. Keep your hips flat against the floor. Hold this position for 10 seconds. Slowly lower your leg to the starting position. Repeat 10 times. Complete this exercise 3-4 times per week.      Squats This exercise strengthens the muscles in front of your thigh and knee (quadriceps). Stand in front of a table, with your feet and knees pointing straight ahead. You may rest your hands on the table for  balance but not for support. Slowly bend your knees and lower your hips like you are going to sit in a chair. Keep your weight over your heels, not over your toes. Keep your lower legs upright so they are parallel with the table legs. Do not let your hips go lower than your knees. Do not bend lower than told by your health care provider. If your knee pain increases, do not bend as low. Hold the squat position for 10 seconds. Slowly push with your legs to return to standing. Do not use your hands to pull yourself to standing. Repeat 10 times. Complete this exercise 3-4 times per week .     Wall slides This exercise strengthens the muscles in front of your thigh and knee (quadriceps). Lean your back against a smooth wall or door, and walk your feet out 18-24 inches (46-61 cm) from it. Place your feet hip-width apart. Slowly slide down the wall or door until your knees bend 90 degrees. Keep your knees over your heels, not over your toes. Keep your knees in line with your hips. Hold this position for 10 seconds. Repeat 10 times. Complete this exercise 3-4 times per week.      Straight leg raises This  exercise strengthens the muscles that rotate the leg at the hip and move it away from your body (hip abductors). Lie on your side with your left / right leg in the top position. Lie so your head, shoulder, knee, and hip line up. You may bend your bottom knee to help you keep your balance. Roll your hips slightly forward so your hips are stacked directly over each other and your left / right knee is facing forward. Leading with your heel, lift your top leg 4-6 inches (10-15 cm). You should feel the muscles in your outer hip lifting. Do not let your foot drift forward. Do not let your knee roll toward the ceiling. Hold this position for 10 seconds. Slowly return your leg to the starting position. Let your muscles relax completely after each repetition. Repeat 10 times. Complete this exercise 3-4 times per week.      Straight leg raises This exercise stretches the muscles that move your hips away from the front of the pelvis (hip extensors). Lie on your abdomen on a firm surface. You can put a pillow under your hips if that is more comfortable. Tense the muscles in your buttocks and lift your left / right leg about 4-6 inches (10-15 cm). Keep your knee straight as you lift your leg. Hold this position for 10 seconds. Slowly lower your leg to the starting position. Let your leg relax completely after each repetition. Repeat 10 times. Complete this exercise 3-4 times per week.

## 2021-11-14 NOTE — Telephone Encounter (Signed)
Spoke with patient.

## 2021-11-14 NOTE — Progress Notes (Signed)
Orthopaedic Clinic Return  Assessment: Colleen Acevedo is a 75 y.o. female with the following: Left knee arthritis; mild  Plan: Patient has left knee pain, which radiates distally.  Presentation most consistent with arthritis.  Radiographs reviewed in clinic, and demonstrates mild degenerative changes overall.  She does have some crepitus within the patellofemoral joint.  Tenderness to palpation along the medial lateral joint lines.  Range of motion is okay.  We discussed multiple treatment options, and she would like to proceed with a steroid injection.  In regards to the numbness and tingling in her feet, this is most consistent with neuropathy, which has been treated with Lyrica.  She does not feel as though the Lyrica helps.  I have urged her to contact her primary care provider in order to be weaned off of the Lyrica appropriately.  All questions were answered, and she is amenable this plan.  I did provide her with home exercises for her to initiate as soon as possible.  Follow-up as needed.  Procedure note injection Left knee joint   Verbal consent was obtained to inject the left knee joint  Timeout was completed to confirm the site of injection.  The skin was prepped with alcohol and ethyl chloride was sprayed at the injection site.  A 21-gauge needle was used to inject 40 mg of Depo-Medrol and 1% lidocaine (3 cc) into the left knee using an anterolateral approach.  There were no complications. A sterile bandage was applied.   Follow-up: Return if symptoms worsen or fail to improve.   Subjective:  Chief Complaint  Patient presents with   Knee Pain    LT knee Radiates down the leg and into top of foot Pain and tingle/pt fell in April    History of Present Illness: Colleen Acevedo is a 75 y.o. female who returns to clinic for evaluation of left knee pain.  I previously seen her in clinic for left shoulder pain.  She reports the shoulder is improving.  However, more recently,  she has had progressively worsening pain in her left knee.  She reports falling in April, of this year, and the pain has gotten worse since then.  She had minimal pain initially.  However, she now has pain which starts in her knee, and radiates distally into her ankle.  She is taking occasional medications as needed.  No physical therapy.  No prior injections.  She also has numbness and tingling in her feet, and has been treated for neuropathy with Lyrica.  Review of Systems: No fevers or chills + numbness and tingling No chest pain No shortness of breath No bowel or bladder dysfunction No GI distress No headaches   Objective: BP 123/71    Pulse 84    Ht 5\' 3"  (1.6 m)    Wt 142 lb (64.4 kg)    BMI 25.15 kg/m   Physical Exam:  Alert and oriented.  No acute distress.  Slow steady gait, without assistive device.  Evaluation left knee demonstrates no effusion.  She is able to achieve full extension.  Crepitus within the patellofemoral joint with range of motion testing.  Flexion beyond 110 degrees.  Tenderness to palpation along the medial lateral joint lines.  No increased laxity to varus or valgus stress.  Negative Lachman.  Sensation is slightly decreased distally.  2+ DP pulse.  IMAGING: I personally ordered and reviewed the following images:  X-ray of the left knee was obtained in clinic today.  Overall, she does have maintenance of  the joint line within the medial lateral compartments, with the more advanced loss of joint space within the patellofemoral compartment.  Small osteophytes are noted within all 3 compartments.  No acute injuries are noted.  Impression: Mild left knee arthritis   Oliver Barre, MD 11/14/2021 10:05 PM

## 2021-11-15 ENCOUNTER — Telehealth: Payer: Self-pay

## 2021-11-15 LAB — URINE CULTURE

## 2021-11-15 NOTE — Telephone Encounter (Signed)
If the lyrica isn't helping, she can reduce it to once a day at bedtime for 2 weeks, and then stopping altogether.

## 2021-11-15 NOTE — Progress Notes (Signed)
Urine culture was negative despite having white blood cells. She should f/u with urology.

## 2021-11-15 NOTE — Telephone Encounter (Signed)
Patient called she states Dr.Cairns told her that if her Lyrica wasn't helping with the tingling she may want to ask you about possibly stopping or tapering off it... her family is concerned about the effects it may have to continue taking it ph# [phone]

## 2021-12-12 ENCOUNTER — Other Ambulatory Visit (HOSPITAL_COMMUNITY): Payer: Self-pay | Admitting: Nurse Practitioner

## 2021-12-12 DIAGNOSIS — Z1231 Encounter for screening mammogram for malignant neoplasm of breast: Secondary | ICD-10-CM

## 2021-12-13 ENCOUNTER — Ambulatory Visit: Payer: Medicare HMO | Admitting: Urology

## 2021-12-13 ENCOUNTER — Other Ambulatory Visit: Payer: Self-pay

## 2021-12-13 ENCOUNTER — Encounter: Payer: Self-pay | Admitting: Urology

## 2021-12-13 VITALS — BP 120/78 | HR 81 | Wt 143.0 lb

## 2021-12-13 DIAGNOSIS — R8290 Unspecified abnormal findings in urine: Secondary | ICD-10-CM

## 2021-12-13 DIAGNOSIS — N3941 Urge incontinence: Secondary | ICD-10-CM

## 2021-12-13 DIAGNOSIS — N390 Urinary tract infection, site not specified: Secondary | ICD-10-CM

## 2021-12-13 LAB — URINALYSIS, ROUTINE W REFLEX MICROSCOPIC
Bilirubin, UA: NEGATIVE
Glucose, UA: NEGATIVE
Ketones, UA: NEGATIVE
Nitrite, UA: NEGATIVE
Protein,UA: NEGATIVE
RBC, UA: NEGATIVE
Specific Gravity, UA: 1.025 (ref 1.005–1.030)
Urobilinogen, Ur: 0.2 mg/dL (ref 0.2–1.0)
pH, UA: 5.5 (ref 5.0–7.5)

## 2021-12-13 LAB — MICROSCOPIC EXAMINATION
Epithelial Cells (non renal): >10 /hpf — AB (ref 0–10)
Renal Epithel, UA: NONE SEEN /hpf
WBC, UA: >30 /hpf — AB (ref 0–5)

## 2021-12-13 LAB — BLADDER SCAN AMB NON-IMAGING

## 2021-12-13 MED ORDER — MIRABEGRON ER 50 MG PO TB24: 50.0000 mg | ORAL_TABLET | Freq: Every day | ORAL | 0 refills | Status: DC

## 2021-12-13 NOTE — Progress Notes (Signed)
lUrological Symptom Review  Patient is experiencing the following symptoms: Frequent urination Hard to postpone urination Get up at night to urinate Leakage of urine Stream starts and stops Urinary tract infection   Review of Systems  Gastrointestinal (upper)  : Indigestion/heartburn  Gastrointestinal (lower) : Constipation  Constitutional : Negative for symptoms  Skin: Negative for skin symptoms  Eyes: Negative for eye symptoms  Ear/Nose/Throat : Negative for Ear/Nose/Throat symptoms  Hematologic/Lymphatic: Negative for Hematologic/Lymphatic symptoms  Cardiovascular : Leg swelling  Respiratory : Negative for respiratory symptoms  Endocrine: Negative for endocrine symptoms  Musculoskeletal: Negative for musculoskeletal symptoms  Neurological: Negative for neurological symptoms  Psychologic: Negative for psychiatric symptoms

## 2021-12-13 NOTE — Progress Notes (Signed)
Assessment: 1. Frequent UTI   2. Abnormal urine findings   3. Urge incontinence     Plan: [facility] Mdx urine culture sent today Await urine culture results before treatment Methods to reduce the risk of UTIs discussed including increased fluid intake, timed and double voiding, very supplement, and daily probiotic.  She is currently using vaginal hormone cream. D/C Vesicare Trial of Myrbetriq 50 mg daily.  Samples given. Return to office in 1 month for pelvic exam  Chief Complaint:  Chief Complaint  Patient presents with   Recurrent UTI    History of Present Illness:  Colleen Acevedo is a 76 y.o. year old female who is seen in consultation from Demetrius Revel, NP for evaluation of frequent UTI's.  She reports 1-2 UTIs per months for the past 6 months.  Typical symptoms include frequency, urgency, and urge incontinence.  No dysuria or gross hematuria.  Her symptoms only slightly improved with antibiotic therapy.  She was last treated for UTI approximately 1 month ago.  She has had a vaginal pessary for approximately 5 years due to prolapse.  Pessary was last removed and cleaned in September 2022.    Urine culture results: 2/22 >100K E. Coli 11/22 25-50K mixed flora 12/22 10-25K mixed flora  She has been on Vesicare 10 mg daily for over 1 year for treatment of her urgency and urge incontinence.  She has not seen any significant improvement in her symptoms with the medication.  No side effects.  Past Medical History:  Past Medical History:  Diagnosis Date   Anxiety disorder    Mixed hyperlipidemia    Parkinson's disease ([place])    Urinary incontinence    uses pessary    Past Surgical History:  Past Surgical History:  Procedure Laterality Date   BACK SURGERY     BREAST BIOPSY Right    KNEE SURGERY     VAGINAL HYSTERECTOMY      Allergies:  Allergies  Allergen Reactions   Amoxicillin Itching and Rash    Family History:  Family History  Problem Relation Age of Onset    Colon cancer Father    Diabetes Mother    Heart attack Brother    Colon cancer Sister    Breast cancer Sister    Heart attack Brother    Thyroid disease Daughter     Social History:  Social History   Tobacco Use   Smoking status: Never   Smokeless tobacco: Never  Vaping Use   Vaping Use: Never used  Substance Use Topics   Alcohol use: Never   Drug use: Never    Review of symptoms:  Constitutional:  Negative for unexplained weight loss, night sweats, fever, chills ENT:  Negative for nose bleeds, sinus pain, painful swallowing CV:  Negative for chest pain, shortness of breath, exercise intolerance, palpitations, loss of consciousness Resp:  Negative for cough, wheezing, shortness of breath GI:  Negative for nausea, vomiting, diarrhea, bloody stools GU:  Positives noted in HPI; otherwise negative for gross hematuria, dysuria Neuro:  Negative for seizures, poor balance, limb weakness, slurred speech Psych:  Negative for lack of energy, depression, anxiety Endocrine:  Negative for polydipsia, polyuria, symptoms of hypoglycemia (dizziness, hunger, sweating) Hematologic:  Negative for anemia, purpura, petechia, prolonged or excessive bleeding, use of anticoagulants  Allergic:  Negative for difficulty breathing or choking as a result of exposure to anything; no shellfish allergy; no allergic response (rash/itch) to materials, foods  Physical exam: BP 120/78    Pulse 81  Wt 143 lb (64.9 kg)    BMI 25.33 kg/m  GENERAL APPEARANCE:  Well appearing, well developed, well nourished, NAD HEENT: Atraumatic, Normocephalic, oropharynx clear. NECK: Supple without lymphadenopathy or thyromegaly. LUNGS: Clear to auscultation bilaterally. HEART: Regular Rate and Rhythm without murmurs, gallops, or rubs. ABDOMEN: Soft, non-tender, No Masses. EXTREMITIES: Moves all extremities well.  Without clubbing, cyanosis, or edema. NEUROLOGIC:  Alert and oriented x 3, normal gait, CN II-XII grossly  intact.  MENTAL STATUS:  Appropriate. BACK:  Non-tender to palpation.  No CVAT SKIN:  Warm, dry and intact.    Results: U/A:  >30 WBC, many bacteria  PVR:  10 ml

## 2021-12-13 NOTE — Progress Notes (Signed)
[facility] MDX sent today  Tracking number [id number]  Pick up number  [id number]

## 2021-12-17 ENCOUNTER — Other Ambulatory Visit: Payer: Self-pay | Admitting: Urology

## 2021-12-18 ENCOUNTER — Other Ambulatory Visit (HOSPITAL_COMMUNITY): Payer: Self-pay | Admitting: Nurse Practitioner

## 2021-12-18 DIAGNOSIS — Z1231 Encounter for screening mammogram for malignant neoplasm of breast: Secondary | ICD-10-CM

## 2021-12-20 ENCOUNTER — Other Ambulatory Visit: Payer: Self-pay

## 2021-12-20 ENCOUNTER — Encounter: Payer: Self-pay | Admitting: Adult Health

## 2021-12-20 ENCOUNTER — Ambulatory Visit: Payer: Medicare HMO | Admitting: Adult Health

## 2021-12-20 VITALS — BP 139/71 | HR 96 | Ht 62.0 in | Wt 145.0 lb

## 2021-12-20 DIAGNOSIS — N898 Other specified noninflammatory disorders of vagina: Secondary | ICD-10-CM | POA: Diagnosis not present

## 2021-12-20 DIAGNOSIS — Z90710 Acquired absence of both cervix and uterus: Secondary | ICD-10-CM

## 2021-12-20 DIAGNOSIS — Z4689 Encounter for fitting and adjustment of other specified devices: Secondary | ICD-10-CM

## 2021-12-20 NOTE — Progress Notes (Signed)
°  Subjective:     Patient ID: Colleen Acevedo, female   DOB: Nov 28, 1945, 76 y.o.   MRN: [id number]  HPI Colleen Acevedo is a 76 year old white female,married, sp hysterectomy, in for pessary maintenance, she has noticed discharge but no odor, she saw Dr Pete Glatter 12/13/21 for UTI. She says she is changing GYN to [facility]. PCP is F. Paseda NP   Review of Systems +vaginal discharge, denies odor Reviewed past medical,surgical, social and family history. Reviewed medications and allergies.     Objective:   Physical Exam BP 139/71 (BP Location: Left Arm, Patient Position: Sitting, Cuff Size: Normal)    Pulse 96    Ht 5\' 2"  (1.575 m)    Wt 145 lb (65.8 kg)    BMI 26.52 kg/m     Skin warm and dry.Pelvic: external genitalia is normal in appearance no lesions, vagina: pessary removed and washed with soap and water, has tan discharge with odor, no lesios or bleeding noted, milex rign #2 easily reinserted,urethra has no lesions or masses noted, cervix and uterus absent, adnexa: no masses or tenderness noted. Bladder is non tender and no masses felt. Examination chaperoned by LPN  Assessment:     1. Encounter for pessary maintenance Has milex ring #2 with support   2. S/P hysterectomy  3. Vaginal discharge Has Metrogel at home, use this for day or 2     Plan:     Follow up in 4 months for pessary maintenance, unless seen in [name] by then

## 2021-12-24 ENCOUNTER — Telehealth: Payer: Self-pay

## 2021-12-24 NOTE — Telephone Encounter (Signed)
Patient called and notified of results.  Voiced understanding. 

## 2021-12-24 NOTE — Telephone Encounter (Signed)
-----   Message from Milderd Meager, MD sent at 12/21/2021  1:01 PM EST ----- Please notify patient that the urine culture did not show evidence of a UTI.  No antibiotic therapy is indicated at this time. Keep f/u appt as scheduled.  ----- Message ----- From: Ferdinand Lango, RN Sent: 12/20/2021   5:20 PM EST To: Gustavus Messing, LPN, Milderd Meager, MD  Please review

## 2022-01-11 ENCOUNTER — Ambulatory Visit: Payer: Medicare HMO | Admitting: Urology

## 2022-01-11 ENCOUNTER — Encounter: Payer: Self-pay | Admitting: Urology

## 2022-01-11 ENCOUNTER — Other Ambulatory Visit: Payer: Self-pay

## 2022-01-11 VITALS — BP 119/74 | HR 86 | Wt 143.8 lb

## 2022-01-11 DIAGNOSIS — N3941 Urge incontinence: Secondary | ICD-10-CM | POA: Diagnosis not present

## 2022-01-11 DIAGNOSIS — N390 Urinary tract infection, site not specified: Secondary | ICD-10-CM | POA: Diagnosis not present

## 2022-01-11 LAB — URINALYSIS, ROUTINE W REFLEX MICROSCOPIC
Bilirubin, UA: NEGATIVE
Glucose, UA: NEGATIVE
Nitrite, UA: NEGATIVE
RBC, UA: NEGATIVE
Specific Gravity, UA: 1.015 (ref 1.005–1.030)
Urobilinogen, Ur: 0.2 mg/dL (ref 0.2–1.0)
pH, UA: 5.5 (ref 5.0–7.5)

## 2022-01-11 LAB — MICROSCOPIC EXAMINATION
Epithelial Cells (non renal): NONE SEEN /hpf (ref 0–10)
Renal Epithel, UA: NONE SEEN /hpf

## 2022-01-11 LAB — BLADDER SCAN AMB NON-IMAGING: Scan Result: 2

## 2022-01-11 MED ORDER — FESOTERODINE FUMARATE ER 8 MG PO TB24: 8.0000 mg | ORAL_TABLET | Freq: Every day | ORAL | 11 refills | Status: DC

## 2022-01-11 NOTE — Progress Notes (Signed)
post void residual =2ml 

## 2022-01-11 NOTE — Progress Notes (Signed)
Assessment: 1. Frequent UTI   2. Urge incontinence      Plan: Continue methods to reduce the risk of UTIs discussed including increased fluid intake, timed and double voiding, very supplement, and daily probiotic.   Continue vaginal hormone cream Continue pessary per GYN Trial of Toviaz 8 mg daily.  Prescription sent.  Use and side effects discussed. Return to office in 6 weeks.  Chief Complaint:  Chief Complaint  Patient presents with   Recurrent UTI    History of Present Illness:  Colleen Acevedo is a 76 y.o. year old female who is seen for further evaluation of frequent UTI's.   At her initial visit in 1/23, she reported 1-2 UTIs per months for the past 6 months.  Typical symptoms include frequency, urgency, and urge incontinence.  No dysuria or gross hematuria.  Her symptoms only slightly improve with antibiotic therapy.  She was last treated for UTI approximately 1 month ago.  She has had a vaginal pessary for approximately 5 years due to prolapse.  Pessary was last removed and cleaned in September 2022.    Urine culture results: 2/22 >100K E. Coli 11/22 25-50K mixed flora 12/22 10-25K mixed flora  She had been on Vesicare 10 mg daily for over 1 year for treatment of her urgency and urge incontinence.  She did not note any significant improvement in her symptoms with the medication.  No side effects. PVR from 1/23: 10 mL  Resolve MDX urine culture from 12/13/2021 showed no organisms. She was given a trial of Myrbetriq 50 mg daily.  She returns today for follow-up.  She has not seen a significant change in her symptoms with the Myrbetriq.  She continues to have urgency, nocturia, and urge incontinence.  Portions of the above documentation were copied from a prior visit for review purposes only.   Past Medical History:  Past Medical History:  Diagnosis Date   Anxiety disorder    Mixed hyperlipidemia    Parkinson's disease ([place])    Urinary incontinence    uses  pessary    Past Surgical History:  Past Surgical History:  Procedure Laterality Date   BACK SURGERY     BREAST BIOPSY Right    KNEE SURGERY     VAGINAL HYSTERECTOMY      Allergies:  Allergies  Allergen Reactions   Amoxicillin Itching and Rash    Family History:  Family History  Problem Relation Age of Onset   Colon cancer Father    Diabetes Mother    Heart attack Brother    Colon cancer Sister    Breast cancer Sister    Heart attack Brother    Thyroid disease Daughter     Social History:  Social History   Tobacco Use   Smoking status: Never   Smokeless tobacco: Never  Vaping Use   Vaping Use: Never used  Substance Use Topics   Alcohol use: Never   Drug use: Never    ROS: Constitutional:  Negative for fever, chills, weight loss CV: Negative for chest pain, previous MI, hypertension Respiratory:  Negative for shortness of breath, wheezing, sleep apnea, frequent cough GI:  Negative for nausea, vomiting, bloody stool, GERD  Physical exam: BP 119/74    Pulse 86    Wt 143 lb 12.8 oz (65.2 kg)    BMI 26.30 kg/m  GENERAL APPEARANCE:  Well appearing, well developed, well nourished, NAD HEENT:  Atraumatic, normocephalic, oropharynx clear NECK:  Supple without lymphadenopathy or thyromegaly ABDOMEN:  Soft,  non-tender, no masses EXTREMITIES:  Moves all extremities well, without clubbing, cyanosis, or edema NEUROLOGIC:  Alert and oriented x 3, normal gait, CN II-XII grossly intact MENTAL STATUS:  appropriate BACK:  Non-tender to palpation, No CVAT SKIN:  Warm, dry, and intact GU: Urethra: well supported, no leakage with cough or valsalva Vagina: yellow discharge in vault, pessary in place, normal appearing mucosa  Results: U/A: 6-10 WBC, few bacteria   I&O cath:  15 ml

## 2022-01-15 ENCOUNTER — Other Ambulatory Visit: Payer: Self-pay

## 2022-01-15 DIAGNOSIS — E785 Hyperlipidemia, unspecified: Secondary | ICD-10-CM

## 2022-01-16 ENCOUNTER — Ambulatory Visit: Payer: Medicare HMO | Admitting: Orthopedic Surgery

## 2022-01-16 ENCOUNTER — Other Ambulatory Visit: Payer: Self-pay

## 2022-01-16 ENCOUNTER — Ambulatory Visit: Payer: Medicare HMO

## 2022-01-16 VITALS — BP 141/79 | HR 77 | Ht 62.0 in | Wt 143.0 lb

## 2022-01-16 DIAGNOSIS — S8254XA Nondisplaced fracture of medial malleolus of right tibia, initial encounter for closed fracture: Secondary | ICD-10-CM

## 2022-01-16 DIAGNOSIS — M25571 Pain in right ankle and joints of right foot: Secondary | ICD-10-CM

## 2022-01-16 NOTE — Patient Instructions (Signed)
No weight on your right ankle.  Use walker to assist with walking

## 2022-01-16 NOTE — Progress Notes (Signed)
Return patient Visit-new problem  Assessment: Colleen Acevedo is a 76 y.o. female with the following: 1. Closed nondisplaced fracture of medial malleolus of right tibia, initial encounter  Plan: Patient has had right ankle pains since rolling her ankle approximately 1 week ago.  Radiographs in clinic today demonstrates a minimally displaced fracture of the medial malleolus.  The ankle otherwise appears stable based on physical exam, as well as the radiographic findings.  I gave her the option of a walking boot versus a cast.  She elected to proceed with a walking boot.  Of asked her to remain nonweightbearing for the next 2 weeks.  She should use a walker to assist with ambulation.  Medications as needed.  We will see her in 2 weeks for repeat evaluation, including a new x-ray.   Follow-up: Return in about 2 weeks (around 01/30/2022).  Subjective:  Chief Complaint  Patient presents with   Ankle Pain    Rt ankle after stepping off curb oddly DOI 01/08/22    History of Present Illness: Colleen Acevedo is a 76 y.o. female who presents to clinic today with a new complaint of right ankle pain.  Proximally 1 week ago, she rolled her ankle.  She states she had minimal pain initially.  Over the next couple days, her pain gradually worsened.  She has been wearing a regular shoe, and take medicines as needed.  She has been able to walk with the assistance of a walker.  She has noticed swelling and pain in the right ankle.  Review of Systems: No fevers or chills No numbness or tingling No chest pain No shortness of breath No bowel or bladder dysfunction No GI distress No headaches     Objective: BP (!) 141/79    Pulse 77    Ht 5\' 2"  (1.575 m)    Wt 143 lb (64.9 kg)    BMI 26.16 kg/m   Physical Exam:  General: Elderly female., Alert and oriented., and No acute distress. Gait: Unable to ambulate.  Evaluation of the right ankle demonstrates diffuse swelling.  Mild bruising is  appreciated.  She is tenderness to palpation directly over the medial malleolus.  Mild tenderness to palpation over the lateral ankle.  Sensation is intact over the dorsum of the foot.  Active motion intact in the EHL/TA.  IMAGING: I personally ordered and reviewed the following images  X-rays of the right ankle were obtained in clinic today.  There is a minimally displaced fracture through the medial malleolus.  This extends to the joint line, without obvious intra-articular step-off.  No acute injuries noted to the distal fibula.  The mortise remains intact and is congruent.  Syndesmosis has not been disrupted.  No medial clear space widening.  Impression: Minimally displaced fracture of the right medial malleolus  New Medications:  No orders of the defined types were placed in this encounter.     Mordecai Rasmussen, MD  01/16/2022 1:45 PM

## 2022-01-17 ENCOUNTER — Ambulatory Visit (HOSPITAL_COMMUNITY): Payer: Medicare HMO

## 2022-01-17 ENCOUNTER — Encounter: Payer: Self-pay | Admitting: Orthopedic Surgery

## 2022-01-21 ENCOUNTER — Ambulatory Visit (HOSPITAL_COMMUNITY): Payer: Medicare HMO

## 2022-01-30 ENCOUNTER — Other Ambulatory Visit: Payer: Self-pay

## 2022-01-30 ENCOUNTER — Ambulatory Visit: Payer: Medicare HMO

## 2022-01-30 ENCOUNTER — Telehealth: Payer: Self-pay | Admitting: Nurse Practitioner

## 2022-01-30 ENCOUNTER — Ambulatory Visit: Payer: Medicare HMO | Admitting: Orthopedic Surgery

## 2022-01-30 ENCOUNTER — Encounter: Payer: Self-pay | Admitting: Orthopedic Surgery

## 2022-01-30 DIAGNOSIS — G5793 Unspecified mononeuropathy of bilateral lower limbs: Secondary | ICD-10-CM

## 2022-01-30 DIAGNOSIS — S8254XD Nondisplaced fracture of medial malleolus of right tibia, subsequent encounter for closed fracture with routine healing: Secondary | ICD-10-CM

## 2022-01-30 DIAGNOSIS — E785 Hyperlipidemia, unspecified: Secondary | ICD-10-CM

## 2022-01-30 MED ORDER — PREGABALIN 50 MG PO CAPS: 50.0000 mg | ORAL_CAPSULE | Freq: Three times a day (TID) | ORAL | 1 refills | Status: DC

## 2022-01-30 MED ORDER — SIMVASTATIN 40 MG PO TABS: 40.0000 mg | ORAL_TABLET | Freq: Every day | ORAL | 1 refills | Status: DC

## 2022-01-30 NOTE — Progress Notes (Signed)
Return patient Visit-new problem ? ?Assessment: ?Colleen Acevedo is a 76 y.o. female with the following: ?1.  Minimally displaced right medial malleolus fracture ? ?Plan: ?Radiographs in clinic today were stable.  She is healing the fracture.  Continue to use the walker to assist with ambulation.  Okay to remove the boot when not ambulating.  She can initiate gentle range of motion.  Follow-up in 3 weeks for repeat evaluation.  At that time, we can discuss a transition to a regular shoe. ? ? ?Follow-up: ?Return in about 3 weeks (around 02/20/2022). ? ?Subjective: ? ?Chief Complaint  ?Patient presents with  ? fracture care  ?  R ankle ?DOI 01/08/22 ?Closed nondisplaced fracture of medial malleolus of right tibia  ? ? ?History of Present Illness: ?Colleen Acevedo is a 76 y.o. female who returns to clinic today for repeat evaluation of right ankle pain.  She sustained a fracture to the right medial malleolus approximately 3 weeks ago.  She has been in a walking boot since.  Limited weightbearing using a walker.  She is doing well overall.  She states the boot put some pressure on her ankle, which worsens her pain.  She has noticed an improvement in the pain otherwise.  Swelling has improved as well. ? ?Review of Systems: ?No fevers or chills ?No numbness or tingling ?No chest pain ?No shortness of breath ?No bowel or bladder dysfunction ?No GI distress ?No headaches ? ? ? ? ?Objective: ?There were no vitals taken for this visit. ? ?Physical Exam: ? ?General: Elderly female., Alert and oriented., and No acute distress. ?Gait: Ambulates comfortably in a walking boot with a walker. ? ?Evaluation of the right ankle demonstrates minimal swelling.  Slight discoloration of the medial malleolus.  Tenderness to palpation over the medial ankle.  No tenderness palpation elsewhere.  Sensations intact over the dorsum of her foot.  Toes are warm and well-perfused. ? ?IMAGING: ?I personally ordered and reviewed the following  images ? ?X-rays of the right ankle were obtained in clinic today.  These were compared to prior x-rays.  A fracture line extending from the shoulder of the mortise proximally is still visible.  There has been some remodeling at the fracture site.  No further displacement.  The mortise remains congruent.  No syndesmotic disruption.  No acute injuries are noted. ? ?Impression: Healing right medial malleolus fracture, minimally displaced ? ?New Medications:  ?No orders of the defined types were placed in this encounter. ? ? ? ? ?Oliver Barre, MD ? ?01/30/2022 ?1:44 PM ? ? ?

## 2022-01-30 NOTE — Telephone Encounter (Signed)
Refills sent advised to contact neurologist about sinemet ?

## 2022-01-30 NOTE — Telephone Encounter (Signed)
Former gray patient , upcoming appt scheduled  ?

## 2022-01-30 NOTE — Telephone Encounter (Signed)
Patient came by office needing refill sent in to [facility] [place],  ? ? ?pregabalin (LYRICA) 50 MG capsule  ? ?simvastatin (ZOCOR) 40 MG table ? ?carbidopa-levodopa (SINEMET IR) 25-100 MG tablet ? ?Patient wants provider to check with pham and make sure there are no duplicate refills from other providers in place as well  ?

## 2022-01-31 ENCOUNTER — Telehealth: Payer: Self-pay | Admitting: Neurology

## 2022-01-31 MED ORDER — CARBIDOPA-LEVODOPA 25-100 MG PO TABS: ORAL_TABLET | ORAL | 2 refills | Status: DC

## 2022-01-31 NOTE — Telephone Encounter (Signed)
Pt is requesting a refill for carbidopa-levodopa (SINEMET IR) 25-100 MG tablet . ? ?Pharmacy: [facility]  ? ?

## 2022-01-31 NOTE — Telephone Encounter (Signed)
Refill has been sent.  °

## 2022-02-11 ENCOUNTER — Ambulatory Visit: Payer: Medicare HMO | Admitting: Nurse Practitioner

## 2022-02-13 ENCOUNTER — Other Ambulatory Visit: Payer: Self-pay | Admitting: Nurse Practitioner

## 2022-02-13 ENCOUNTER — Other Ambulatory Visit: Payer: Self-pay

## 2022-02-13 DIAGNOSIS — N3941 Urge incontinence: Secondary | ICD-10-CM

## 2022-02-13 DIAGNOSIS — K219 Gastro-esophageal reflux disease without esophagitis: Secondary | ICD-10-CM

## 2022-02-13 MED ORDER — FESOTERODINE FUMARATE ER 8 MG PO TB24: 8.0000 mg | ORAL_TABLET | Freq: Every day | ORAL | 3 refills | Status: DC

## 2022-02-14 ENCOUNTER — Other Ambulatory Visit (HOSPITAL_COMMUNITY): Payer: Self-pay | Admitting: Nurse Practitioner

## 2022-02-14 DIAGNOSIS — Z1231 Encounter for screening mammogram for malignant neoplasm of breast: Secondary | ICD-10-CM

## 2022-02-19 ENCOUNTER — Ambulatory Visit: Payer: Medicare HMO | Admitting: Orthopedic Surgery

## 2022-02-21 ENCOUNTER — Ambulatory Visit: Payer: Medicare HMO | Admitting: Urology

## 2022-02-22 ENCOUNTER — Ambulatory Visit: Payer: Medicare HMO | Admitting: Orthopedic Surgery

## 2022-02-27 ENCOUNTER — Ambulatory Visit: Payer: Medicare HMO | Admitting: Orthopedic Surgery

## 2022-02-27 ENCOUNTER — Encounter: Payer: Self-pay | Admitting: Orthopedic Surgery

## 2022-02-27 ENCOUNTER — Ambulatory Visit: Payer: Medicare HMO

## 2022-02-27 VITALS — Ht 62.0 in | Wt 143.0 lb

## 2022-02-27 DIAGNOSIS — S8254XD Nondisplaced fracture of medial malleolus of right tibia, subsequent encounter for closed fracture with routine healing: Secondary | ICD-10-CM | POA: Diagnosis not present

## 2022-02-27 NOTE — Progress Notes (Signed)
Return patient Visit-new problem ? ?Assessment: ?Colleen Acevedo is a 76 y.o. female with the following: ?1.  Minimally displaced right medial malleolus fracture ? ?Plan: ?Radiographs of the right ankle demonstrate a medial malleolus fracture in stable position.  There is no further displacement.  She has no pain.  She is been ambulating well with the assistance of a walker, and a cam boot.  She can now transition to a regular shoe.  Continue to use the walker as needed.  Medications as needed.  Call with questions or concerns.  Follow-up as needed. ? ? ?Follow-up: ?Return if symptoms worsen or fail to improve. ? ?Subjective: ? ?Chief Complaint  ?Patient presents with  ? Fracture  ?  Rt ankle DOI 01/08/22  ? ? ?History of Present Illness: ?Colleen Acevedo is a 76 y.o. female who returns to clinic today for repeat evaluation of right ankle pain.  She sustained a fracture to the right medial malleolus approximately 6 weeks ago.  She continues to use a walking boot to assist with ambulation.  She is able to bear full weight, using a walker.  She has noticed improved swelling and bruising about the ankle.  She has not taken any pain medications.  She feels confident walking in a boot. ? ?Review of Systems: ?No fevers or chills ?No numbness or tingling ?No chest pain ?No shortness of breath ?No bowel or bladder dysfunction ?No GI distress ?No headaches ? ? ? ? ?Objective: ?Ht 5\' 2"  (1.575 m)   Wt 143 lb (64.9 kg)   BMI 26.16 kg/m?  ? ?Physical Exam: ? ?General: Elderly female., Alert and oriented., and No acute distress. ?Gait: Ambulates comfortably in a walking boot with a walker. ? ?Evaluation of the right ankle demonstrates no deformity.  No swelling is appreciated.  No bruising.  She has tenderness to deep palpation over the medial malleolus.  Mild discomfort with resisted inversion and eversion.  Sensation is intact over the dorsum of the foot.  Toes are warm and well-perfused. ? ?IMAGING: ?I personally ordered  and reviewed the following images ? ?X-rays of the right ankle were obtained in clinic today.  There is a nondisplaced fracture of the medial malleolus.  This remains unchanged in alignment.  There is some evidence of callus formation around the fracture.  Mortise is congruent.  No syndesmotic disruption. ? ?Impression: Healing right medial malleolus fracture, nondisplaced ? ?New Medications:  ?No orders of the defined types were placed in this encounter. ? ? ? ? ? , MD ? ?02/27/2022 ?10:57 PM ? ? ?

## 2022-02-28 ENCOUNTER — Ambulatory Visit (HOSPITAL_COMMUNITY)
Admission: RE | Admit: 2022-02-28 | Discharge: 2022-02-28 | Disposition: A | Discharge status: Home / Self Care | Payer: Medicare HMO | Source: Ambulatory Visit | Attending: Nurse Practitioner | Admitting: Nurse Practitioner

## 2022-02-28 DIAGNOSIS — Z1231 Encounter for screening mammogram for malignant neoplasm of breast: Secondary | ICD-10-CM | POA: Diagnosis not present

## 2022-03-05 ENCOUNTER — Encounter: Payer: Self-pay | Admitting: Nurse Practitioner

## 2022-03-05 ENCOUNTER — Ambulatory Visit (INDEPENDENT_AMBULATORY_CARE_PROVIDER_SITE_OTHER): Payer: Medicare HMO | Admitting: Nurse Practitioner

## 2022-03-05 VITALS — BP 128/74 | HR 71 | Ht 62.0 in | Wt 140.0 lb

## 2022-03-05 DIAGNOSIS — F324 Major depressive disorder, single episode, in partial remission: Secondary | ICD-10-CM | POA: Diagnosis not present

## 2022-03-05 DIAGNOSIS — K219 Gastro-esophageal reflux disease without esophagitis: Secondary | ICD-10-CM | POA: Diagnosis not present

## 2022-03-05 DIAGNOSIS — E785 Hyperlipidemia, unspecified: Secondary | ICD-10-CM

## 2022-03-05 DIAGNOSIS — G20 Parkinson's disease: Secondary | ICD-10-CM | POA: Diagnosis not present

## 2022-03-05 NOTE — Patient Instructions (Signed)
Please get your shingles vaccine, TDAP vaccine and pneumonia vaccine at your pharmacy ? ?It is important that you exercise regularly at least 30 minutes 5 times a week.  ?Think about what you will eat, plan ahead. ?Choose " clean, green, fresh or frozen" over canned, processed or packaged foods which are more sugary, salty and fatty. ?70 to 75% of food eaten should be vegetables and fruit. ?Three meals at set times with snacks allowed between meals, but they must be fruit or vegetables. ?Aim to eat over a 12 hour period , example 7 am to 7 pm, and STOP after  your last meal of the day. ?Drink water,generally about 64 ounces per day, no other drink is as healthy. Fruit juice is best enjoyed in a healthy way, by EATING the fruit. ? ?Thanks for choosing [facility], we consider it a privelige to serve you. ? ?

## 2022-03-05 NOTE — Assessment & Plan Note (Signed)
Chronic condition well-controlled ?Takes Zoloft 25 mg daily continue current medication.  ? ?

## 2022-03-05 NOTE — Progress Notes (Signed)
? ?  Colleen Acevedo     MRN: [id number]      DOB: [birth year] ? ? ?HPI ?Ms. Colleen Acevedo with past medical history of GERD, Parkinson's disease, vitamin D deficiency, hyperlipidemia, depression is here for follow up and re-evaluation of chronic medical conditions,  ? ?The PT denies any adverse reactions to current medications since the last visit.  ?There are no new concerns.  ?There are no specific complaints  ? ? ?Due for Tdap, shingles, pneumonia vaccine, need for all vaccines discussed with patient patient encouraged to get vaccinations at her pharmacy she verbalized understanding.  ? ?ROS ?Denies recent fever or chills. ?Denies sinus pressure, nasal congestion, ear pain or sore throat. ?Denies chest congestion, productive cough or wheezing. ?Denies chest pains, palpitations and leg swelling ?Denies abdominal pain, nausea, vomiting,diarrhea or constipation.   ?Denies dysuria, frequency, hesitancy or incontinence. ?Denies joint pain, swelling and limitation in mobility. ?Denies depression, anxiety or insomnia. ? ? ? ? ?PE ? ?BP 128/74 (BP Location: Left Arm, Patient Position: Sitting, Cuff Size: Normal)   Pulse 71   Ht 5\' 2"  (1.575 m)   Wt 140 lb (63.5 kg)   SpO2 98%   BMI 25.61 kg/m?  ? ?Patient alert and oriented and in no cardiopulmonary distress. ? ? ?Chest: Clear to auscultation bilaterally. ? ?CVS: S1, S2 no murmurs, no S3.Regular rate. ? ?ABD: Soft non tender.  ? ?Ext: No edema ? ?MS: Adequate ROM spine, shoulders, hips and knees. ? ?Skin: Intact, no ulcerations or rash noted. ? ?Psych: Good eye contact, normal affect. Memory intact not anxious or depressed appearing. ? ? ? ? ?Assessment & Plan ? ?Hyperlipidemia ?On simvastatin 40 mg daily ?Check lipid panel today.  ? ?Depression, major, single episode, in partial remission (HCC) ?Chronic condition well-controlled ?Takes Zoloft 25 mg daily continue current medication.  ? ? ?GERD (gastroesophageal reflux disease) ?Chronic condition well-controlled on  Protonix 40 mg daily ?Continue current medication.  ? ?Parkinson disease (HCC) ?Chronic condition well-controlled on Sinemet 25- 100 mg tablet daily ?Followed by neurology ?Continue collaboration with neurology.  ?  ?

## 2022-03-05 NOTE — Assessment & Plan Note (Signed)
Chronic condition well-controlled on Sinemet 25- 100 mg tablet daily ?Followed by neurology ?Continue collaboration with neurology.  ?

## 2022-03-05 NOTE — Assessment & Plan Note (Signed)
Chronic condition well-controlled on Protonix 40 mg daily ?Continue current medication.  ?

## 2022-03-05 NOTE — Assessment & Plan Note (Signed)
On simvastatin 40 mg daily ?Check lipid panel today.  ?

## 2022-03-08 DIAGNOSIS — E785 Hyperlipidemia, unspecified: Secondary | ICD-10-CM | POA: Diagnosis not present

## 2022-03-09 LAB — LIPID PANEL
Chol/HDL Ratio: 2.7 ratio (ref 0.0–4.4)
Cholesterol, Total: 143 mg/dL (ref 100–199)
HDL: 53 mg/dL (ref >39)
LDL Chol Calc (NIH): 71 mg/dL (ref 0–99)
Triglycerides: 107 mg/dL (ref 0–149)
VLDL Cholesterol Cal: 19 mg/dL (ref 5–40)

## 2022-03-09 LAB — CMP14+EGFR
ALT: <5 IU/L (ref 0–32)
AST: 12 IU/L (ref 0–40)
Albumin/Globulin Ratio: 2.1 (ref 1.2–2.2)
Albumin: 4.1 g/dL (ref 3.7–4.7)
Alkaline Phosphatase: 99 IU/L (ref 44–121)
BUN/Creatinine Ratio: 24 (ref 12–28)
BUN: 18 mg/dL (ref 8–27)
Bilirubin Total: 0.5 mg/dL (ref 0.0–1.2)
CO2: 27 mmol/L (ref 20–29)
Calcium: 9.9 mg/dL (ref 8.7–10.3)
Chloride: 103 mmol/L (ref 96–106)
Creatinine, Ser: 0.75 mg/dL (ref 0.57–1.00)
Globulin, Total: 2 g/dL (ref 1.5–4.5)
Glucose: 88 mg/dL (ref 70–99)
Potassium: 4.6 mmol/L (ref 3.5–5.2)
Sodium: 141 mmol/L (ref 134–144)
Total Protein: 6.1 g/dL (ref 6.0–8.5)
eGFR: 82 mL/min/{1.73_m2} (ref >59)

## 2022-03-11 NOTE — Progress Notes (Signed)
Labs are stable, continue current medications

## 2022-03-14 ENCOUNTER — Ambulatory Visit: Payer: Medicare HMO | Admitting: Urology

## 2022-03-14 ENCOUNTER — Encounter: Payer: Self-pay | Admitting: Urology

## 2022-03-14 VITALS — BP 99/65 | HR 80

## 2022-03-14 DIAGNOSIS — N3941 Urge incontinence: Secondary | ICD-10-CM | POA: Diagnosis not present

## 2022-03-14 DIAGNOSIS — N390 Urinary tract infection, site not specified: Secondary | ICD-10-CM

## 2022-03-14 LAB — URINALYSIS, ROUTINE W REFLEX MICROSCOPIC
Bilirubin, UA: NEGATIVE
Glucose, UA: NEGATIVE
Ketones, UA: NEGATIVE
Nitrite, UA: NEGATIVE
Protein,UA: NEGATIVE
Specific Gravity, UA: 1.02 (ref 1.005–1.030)
Urobilinogen, Ur: 0.2 mg/dL (ref 0.2–1.0)
pH, UA: 6.5 (ref 5.0–7.5)

## 2022-03-14 LAB — MICROSCOPIC EXAMINATION: Renal Epithel, UA: NONE SEEN /hpf

## 2022-03-14 NOTE — Progress Notes (Signed)
? ?Assessment: ?1. Urge incontinence   ?2. Frequent UTI   ? ? ?Plan: ?Continue methods to reduce the risk of UTIs discussed including increased fluid intake, timed and double voiding, very supplement, and daily probiotic.   ?Continue vaginal hormone cream ?Continue pessary per GYN ?Continue Toviaz 8 mg daily.  ?Return to office in 3 months ? ? ?Chief Complaint:  ?Chief Complaint  ?Patient presents with  ? Urinary Incontinence  ? ? ?History of Present Illness: ? ?Colleen Acevedo is a 76 y.o. year old female who is seen for further evaluation of frequent UTI's and urge incontinence.   ?At her initial visit in 1/23, she reported 1-2 UTIs per months for the past 6 months.  Typical symptoms include frequency, urgency, and urge incontinence.  No dysuria or gross hematuria.  Her symptoms only slightly improve with antibiotic therapy.  She was last treated for UTI approximately 1 month ago.  She has had a vaginal pessary for approximately 5 years due to prolapse.  Pessary was last removed and cleaned in September 2022.   ? ?Urine culture results: ?2/22 >100K E. Coli ?11/22 25-50K mixed flora ?12/22 10-25K mixed flora ? ?She had been on Vesicare 10 mg daily for over 1 year for treatment of her urgency and urge incontinence.  She did not note any significant improvement in her symptoms with the medication.  No side effects. ?PVR from 1/23: 10 mL ? ?Resolve MDX urine culture from 12/13/2021 showed no organisms. ?She was given a trial of Myrbetriq 50 mg daily but did not notice a significant change in her symptoms.  She continued to have urgency, nocturia, and urge incontinence. ?In and out cath volume was 15 mL. ?Pelvic examination demonstrated good support of the urethra and no evidence of incontinence with cough or Valsalva.  She continued the vaginal pessary per GYN. ?She was given a trial of Toviaz at her visit in 2/23. ? ?She returns today for follow-up.  She [place]  She has noted some improvement in her  urgency, nocturia, and urge incontinence.  She is taking the medication at night.  She does notice daytime frequency if she tries to increase her fluid intake.  She has been restricting her fluids.  No dysuria or gross hematuria. ? ?Portions of the above documentation were copied from a prior visit for review purposes only. ? ? ?Past Medical History:  ?Past Medical History:  ?Diagnosis Date  ? Anxiety disorder   ? Mixed hyperlipidemia   ? Parkinson's disease (HCC)   ? Urinary incontinence   ? uses pessary  ? ? ?Past Surgical History:  ?Past Surgical History:  ?Procedure Laterality Date  ? BACK SURGERY    ? BREAST BIOPSY Right   ? KNEE SURGERY    ? VAGINAL HYSTERECTOMY    ? ? ?Allergies:  ?Allergies  ?Allergen Reactions  ? Amoxicillin Itching and Rash  ? ? ?Family History:  ?Family History  ?Problem Relation Age of Onset  ? Colon cancer Father   ? Diabetes Mother   ? Heart attack Brother   ? Colon cancer Sister   ? Breast cancer Sister   ? Heart attack Brother   ? Thyroid disease Daughter   ? ? ?Social History:  ?Social History  ? ?Tobacco Use  ? Smoking status: Never  ? Smokeless tobacco: Never  ?Vaping Use  ? Vaping Use: Never used  ?Substance Use Topics  ? Alcohol use: Never  ? Drug use: Never  ? ? ?ROS: ?Constitutional:  Negative for fever, chills, weight loss ?CV: Negative for chest pain, previous MI, hypertension ?Respiratory:  Negative for shortness of breath, wheezing, sleep apnea, frequent cough ?GI:  Negative for nausea, vomiting, bloody stool, GERD ? ?Physical exam: ?BP 99/65   Pulse 80  ?GENERAL APPEARANCE:  Well appearing, well developed, well nourished, NAD ?HEENT:  Atraumatic, normocephalic, oropharynx clear ?NECK:  Supple without lymphadenopathy or thyromegaly ?ABDOMEN:  Soft, non-tender, no masses ?EXTREMITIES:  Moves all extremities well, without clubbing, cyanosis, or edema ?NEUROLOGIC:  Alert and oriented x 3, normal gait, CN II-XII grossly intact ?MENTAL STATUS:  appropriate ?BACK:  Non-tender  to palpation, No CVAT ?SKIN:  Warm, dry, and intact ? ?Results: ?U/A: 6-10 WBC, 0-2 RBC, moderate bacteria, nitrite negative, yeast ?

## 2022-03-27 ENCOUNTER — Other Ambulatory Visit: Payer: Self-pay | Admitting: *Deleted

## 2022-03-27 ENCOUNTER — Telehealth: Payer: Self-pay | Admitting: Neurology

## 2022-03-27 MED ORDER — CARBIDOPA-LEVODOPA 25-100 MG PO TABS: 2.0000 | ORAL_TABLET | Freq: Four times a day (QID) | ORAL | 0 refills | Status: DC

## 2022-03-27 NOTE — Telephone Encounter (Signed)
Pt said, carbidopa-levodopa (SINEMET IR) 25-100 MG tablet bottle got thrown away by mistake. Mail order can not sent out medication until May 13. Want to know if you can send a prescription to [facility] [phone]. Would like a call from the nurse. ?

## 2022-03-27 NOTE — Telephone Encounter (Signed)
LMVM for pt that will send 14 day supply emergency fill for sinemet 25/100 2 tabs QID #112 to local [facility] pharmacy.  Ok'd by Dr. Frances Furbish.   ?

## 2022-03-28 DIAGNOSIS — N390 Urinary tract infection, site not specified: Secondary | ICD-10-CM | POA: Diagnosis not present

## 2022-03-28 DIAGNOSIS — N9089 Other specified noninflammatory disorders of vulva and perineum: Secondary | ICD-10-CM | POA: Diagnosis not present

## 2022-03-28 DIAGNOSIS — Z4689 Encounter for fitting and adjustment of other specified devices: Secondary | ICD-10-CM | POA: Diagnosis not present

## 2022-04-19 ENCOUNTER — Ambulatory Visit: Payer: Medicare HMO | Admitting: Adult Health

## 2022-04-24 ENCOUNTER — Encounter: Payer: Self-pay | Admitting: Neurology

## 2022-04-24 ENCOUNTER — Ambulatory Visit: Payer: Medicare HMO | Admitting: Neurology

## 2022-04-24 VITALS — BP 122/70 | HR 85 | Ht 62.0 in | Wt 134.0 lb

## 2022-04-24 DIAGNOSIS — K5909 Other constipation: Secondary | ICD-10-CM | POA: Diagnosis not present

## 2022-04-24 DIAGNOSIS — G8929 Other chronic pain: Secondary | ICD-10-CM | POA: Diagnosis not present

## 2022-04-24 DIAGNOSIS — G20 Parkinson's disease: Secondary | ICD-10-CM | POA: Diagnosis not present

## 2022-04-24 DIAGNOSIS — F439 Reaction to severe stress, unspecified: Secondary | ICD-10-CM | POA: Diagnosis not present

## 2022-04-24 DIAGNOSIS — M25562 Pain in left knee: Secondary | ICD-10-CM

## 2022-04-24 NOTE — Patient Instructions (Addendum)
It was nice to see you again today.  I am glad to hear that you were able to move into your home again.  Please continue to take your Sinemet generic 2 pills 4 times daily. Try to stay active mentally and physically, avoid stepping on step ladders or stools.  Please try to hydrate well with water, about 6 to 8 cups of water per day are recommended for the average adult, 8 ounce size each.  Use a cane as needed for gait stability as you also have arthritis in your left knee.  Please be proactive about constipation issues, use a stool softener and/or laxative as needed.

## 2022-04-24 NOTE — Progress Notes (Signed)
Subjective:    Patient ID: Colleen Acevedo is a 76 y.o. female.  HPI    Interim history:   Colleen Acevedo is a 76 year old right-handed woman with an underlying medical history of hyperlipidemia, recurrent UTIs, left knee arthritis, and anxiety, who presents for follow-up consultation of her Parkinson's disease.  The patient is unaccompanied today. I last saw her on 10/24/2021, at which time she reported feeling fairly stable, motorwise.  She had noticed decline in her short-term memory with a tendency to forget conversations or asked the same question again.  She was trying to write more things down.  She reported left knee swelling and pain.  She was advised to continue with Sinemet 2 pills 4 times daily.  Today, 04/24/2022: She reports feeling fairly stable, as of March 2023 she has been divorced, as of mid May 2023 she was able to move back into her home.  Her husband got the car, she currently does not have a car.  She still has stress as he did not adhere to the contract and took some appliances that he was not supposed to take.  Generally speaking, her stress level is better, she is still noticing flareup of her Parkinson's tremor from time to time.  She continues to take Sinemet 2 pills 4 times daily.  She continues to take Lyrica for tingling in her feet, she has had the symptoms for a couple of years and Lyrica has not helped.  She saw orthopedics on 11/14/2021 for left knee arthritis.  She was encouraged to wean off of Lyrica.  She received a steroid injection into the left knee.  In mid February 2023 she sustained a nondisplaced medial ankle fracture and saw orthopedics again.  She was treated with a walking boot.  She has been seeing urology for frequent UTIs.  She was started on Toviaz some 3 months ago, she no longer takes Myrbetriq. Her left knee pain is better.  She does not typically use a cane or walker.  She has both available.  Her right ankle is better, no significant residual  pain.   The patient's allergies, current medications, family history, past medical history, past social history, past surgical history and problem list were reviewed and updated as appropriate.    Previously:    I saw her on 04/11/2021, at which time she reported significant stress.  She had moved in with her daughter who lives next-door, as the patient had separated from her husband.  Her exam was quite stable.  She was advised to continue with generic Sinemet 2 pills 4 times a day.   I saw her on 09/05/2020, at which time she was fairly stable, had no recent falls.  She was advised to follow-up routinely in 6 months.     I saw her on 03/06/2020, at which time she was taking Sinemet 2 pills 4 times daily.  Her husband was worried about her unintentional weight loss.  This was ongoing over a few years.  She was advised to talk to her primary care physician about additional work-up for weight loss.  We talked about fall prevention.  She had fallen twice since her previous visit.  She was advised to hydrate better and to be proactive about constipation issues.  She was advised to continue with Sinemet at the current dose.   I first met her on 11/04/2019, at which time she reported a diagnosis of Parkinson's disease since 2016.  She was on Sinemet, 2 pills in the morning, 3  midday and 2 in the evening.  She was advised change her regimen to 2 pills 4 times a day on a scheduled 4 hourly basis.   11/04/19: (She) was diagnosed with Parkinson's disease in 2016, while she was still residing in [place].  She has moved to [facility]  She was followed by a neurologist.  I was able to review some of her neurology records from [facility] in [place], [place].  I reviewed the office note from 03/17/2018, at which time the patient was deemed stable as far as her Parkinson's disease and was advised to continue with Sinemet 2 pills 3 times daily.  She was advised to increase it if needed to 2 pills  4 times a day.  I reviewed your office note from 08/11/2019, which you kindly included. She has been on Sinemet since 2016. She had blood work through your office on 08/06/2019 and I reviewed the results, CBC with differential and platelets was unremarkable, CMP showed benign findings, lipid panel showed total cholesterol of 155, triglycerides 82, LDL 16. They moved at the end of June, she is originally from [place], they have a daughter in [place] and one in [place] and they moved to be closer to her.  Daughter lives next door and they have 2 grandchildren from her and also great-grandchildren.  The patient is a non-smoker and does not utilize alcohol and drinks caffeine and limitation, 1 cup of coffee in the morning typically.  She has not had any recent falls thankfully, she has had some intermittent issues with constipation, takes a probiotic and sometimes a laxative.  She admits that she does not drink a whole lot of water, estimates that she drinks about 1 bottle of water per day on average.  She is physically quite active, no issues with memory, mood is stable.  She has no family history of Parkinson's disease. Her symptoms started towards the end of 2015, may be in December with a right hand tremor and feeling nervous.  She has not had a brain scan such as CT or MRI.She has been followed by Dr. Carleene Cooper. She currently takes Sinemet 2 pills at 7 AM, 3 pills at 12 and 2 pills at 8 PM.  She does take the medication often with her meals.   Her Past Medical History Is Significant For: Past Medical History:  Diagnosis Date   Anxiety disorder    Mixed hyperlipidemia    Parkinson's disease ([place])    Urinary incontinence    uses pessary    Her Past Surgical History Is Significant For: Past Surgical History:  Procedure Laterality Date   BACK SURGERY     BREAST BIOPSY Right    KNEE SURGERY     VAGINAL HYSTERECTOMY      Her Family History Is Significant For: Family History  Problem  Relation Age of Onset   Colon cancer Father    Diabetes Mother    Heart attack Brother    Colon cancer Sister    Breast cancer Sister    Heart attack Brother    Thyroid disease Daughter     Her Social History Is Significant For: Social History   Socioeconomic History   Marital status: Divorced    Spouse name: Not on file   Number of children: 2   Years of education: Not on file   Highest education level: Not on file  Occupational History    Comment: retired  Tobacco Use   Smoking status: Never  Smokeless tobacco: Never  Vaping Use   Vaping Use: Never used  Substance and Sexual Activity   Alcohol use: Never   Drug use: Never   Sexual activity: Not Currently    Birth control/protection: Surgical    Comment: hyst  Other Topics Concern   Not on file  Social History Narrative   Lives at home alone   Right handed   Caffeine: occasional   Social Determinants of Health   Financial Resource Strain: Low Risk    Difficulty of Paying Living Expenses: Not hard at all  Food Insecurity: No Food Insecurity   Worried About Charity fundraiser in the Last Year: Never true   Ran Out of Food in the Last Year: Never true  Transportation Needs: No Transportation Needs   Lack of Transportation (Medical): No   Lack of Transportation (Non-Medical): No  Physical Activity: Insufficiently Active   Days of Exercise per Week: 2 days   Minutes of Exercise per Session: 30 min  Stress: No Stress Concern Present   Feeling of Stress : Not at all  Social Connections: Socially Isolated   Frequency of Communication with Friends and Family: More than three times a week   Frequency of Social Gatherings with Friends and Family: More than three times a week   Attends Religious Services: Never   Marine scientist or Organizations: No   Attends Archivist Meetings: Never   Marital Status: Separated    Her Allergies Are:  Allergies  Allergen Reactions   Amoxicillin Itching and  Rash  :   Her Current Medications Are:  Outpatient Encounter Medications as of 04/24/2022  Medication Sig   Calcium Carbonate-Vitamin D (CALCIUM 500 + D) 500-125 MG-UNIT TABS Take by mouth.   carbidopa-levodopa (SINEMET IR) 25-100 MG tablet TAKE 2 TABLETS 4  TIMES DAILY.   carbidopa-levodopa (SINEMET IR) 25-100 MG tablet Take 2 tablets by mouth 4 (four) times daily.   CRANBERRY PO Take by mouth.   estradiol (ESTRACE) 0.1 MG/GM vaginal cream Place 1 Applicatorful vaginally. Every other day   fesoterodine (TOVIAZ) 8 MG TB24 tablet Take 1 tablet (8 mg total) by mouth daily.   loratadine (CLARITIN) 10 MG tablet Take 10 mg by mouth daily.   metroNIDAZOLE (METROGEL VAGINAL) 0.75 % vaginal gel Place 1 Applicatorful vaginally at bedtime.   omeprazole (PRILOSEC) 40 MG capsule TAKE 1 CAPSULE(40 MG) BY MOUTH DAILY   OVER THE COUNTER MEDICATION Velbet El energy antler 250 mg   pregabalin (LYRICA) 50 MG capsule Take 1 capsule (50 mg total) by mouth 3 (three) times daily.   Propylene Glycol (SYSTANE COMPLETE OP) Apply to eye in the morning and at bedtime.   sertraline (ZOLOFT) 25 MG tablet TAKE 2 TABLETS EVERY DAY   simvastatin (ZOCOR) 40 MG tablet Take 1 tablet (40 mg total) by mouth daily.   vitamin B-12 (CYANOCOBALAMIN) 100 MCG tablet Take 100 mcg by mouth daily.   No facility-administered encounter medications on file as of 04/24/2022.  :  Review of Systems:  Out of a complete 14 point review of systems, all are reviewed and negative with the exception of these symptoms as listed below:  Review of Systems  Neurological:        Patient is her alone for a follow-up. She feels her symptoms are the same. Denies any new concerns.   Objective:  Neurological Exam  Physical Exam Physical Examination:   Vitals:   04/24/22 1308  BP: 122/70  Pulse: 85  General Examination: The patient is a very pleasant 76 y.o. female in no acute distress. She appears well-developed and well-nourished and well  groomed.   HEENT: Normocephalic, atraumatic, pupils are round and reactive to light, extraocular tracking is mildly impaired, she wears corrective eyeglasses.  She has bilateral cataracts.  She has mild left ptosis and left pupil is a little larger than the right, stable. She has mild facial masking mild nuchal rigidity is noted, no lip, neck or jaw tremor, airway examination reveals mild mouth dryness, tongue protrudes centrally and palate elevates symmetrically, she has mild hypophonia. No sialorrhea.   Chest: Clear to auscultation without wheezing, rhonchi or crackles noted.   Heart: S1+S2+0, regular and normal without murmurs, rubs or gallops noted.    Abdomen: Soft, non-tender and non-distended.   Extremities: There is no edema in the lower extremities.   Skin: Warm and dry without trophic changes noted.   Musculoskeletal: exam reveals mild left knee swelling.     Neurologically:  Mental status: The patient is awake, alert and oriented in all 4 spheres. Her immediate and remote memory, attention, language skills and fund of knowledge are appropriate. There is no evidence of aphasia, agnosia, apraxia or anomia. Speech is clear with normal prosody and enunciation. Thought process is linear. Mood is normal and affect is normal.  Cranial nerves II - XII are as described above under HEENT exam.    (On 11/04/2019: On Archimedes spiral drawing she has no tremor with the right hand, fairly good spiral, left hand is insecure, handwriting is legible, not particularly tremulous, slightly on the smaller side.)   Motor exam: Overall, thin bulk, global strength of 4/5, mild intermittent right upper extremity tremor noted at rest, stable.  She has slight increase in tone with cogwheeling noted in the right upper extremity, otherwise good tone on the L. Fine motor skills with finger taps and foot agility, she has mild impairment on the right side, slightly abnormal findings on the left.  Sensory exam is  intact to light touch.  Cerebellar testing shows no dysmetria or intention tremor. Gait, station and balance: She stands without major difficulty, posture is slightly stooped for age, left shoulder higher than right.  She walks with decreased stride length and decreased pace, decreased arm swing bilaterally, more so on the R.  Turns well, balance is fairly well preserved.  No walking aid.   Assessment and Plan:    In summary, Shelene Krage is a very pleasant 76 year old female with an underlying medical history of hyperlipidemia, and anxiety, who presents for follow-up consultation of her Parkinson's disease with right-sided predominance, complicated by mild constipation. She has responded to levodopa therapy and is currently taking 2 pills 4 times a day on a 4 hourly schedule, starting at 7:30 AM.  We talked about fall risk and the importance of fall prevention.  She received an injection into her left knee and was treated with a walking boot for a right ankle fracture.  We again talked about the importance of stress reduction.  She has a good psychosocial support system, her daughter lives next door.  She has help maintaining her yard.  She moved back into her home after her divorce.  We talked about the importance of healthy lifestyle, good nutrition, good hydration, staying active mentally and physically.  She reports mild forgetfulness but things are stable, we will continue to monitor.   She is overall feeling stable on the Sinemet and we mutually agreed to keep her  at the current dose.  She is advised to be proactive about constipation issues.  I recommended a follow-up routinely in about 6 months, sooner if needed. I answered all her questions today and the patient was in agreement. I spent 30 minutes in total face-to-face time and in reviewing records during pre-charting, more than 50% of which was spent in counseling and coordination of care, reviewing test results, reviewing medications and  treatment regimen and/or in discussing or reviewing the diagnosis of PD, the prognosis and treatment options. Pertinent laboratory and imaging test results that were available during this visit with the patient were reviewed by me and considered in my medical decision making (see chart for details).

## 2022-04-25 ENCOUNTER — Other Ambulatory Visit: Payer: Self-pay

## 2022-04-25 DIAGNOSIS — K219 Gastro-esophageal reflux disease without esophagitis: Secondary | ICD-10-CM

## 2022-04-25 MED ORDER — OMEPRAZOLE 40 MG PO CPDR: DELAYED_RELEASE_CAPSULE | ORAL | 3 refills | Status: DC

## 2022-04-26 ENCOUNTER — Other Ambulatory Visit: Payer: Self-pay | Admitting: Nurse Practitioner

## 2022-04-26 DIAGNOSIS — F324 Major depressive disorder, single episode, in partial remission: Secondary | ICD-10-CM

## 2022-04-26 MED ORDER — SERTRALINE HCL 25 MG PO TABS: 50.0000 mg | ORAL_TABLET | Freq: Every day | ORAL | 3 refills | Status: DC

## 2022-06-13 ENCOUNTER — Ambulatory Visit: Payer: Medicare HMO | Admitting: Urology

## 2022-06-28 DIAGNOSIS — R300 Dysuria: Secondary | ICD-10-CM | POA: Diagnosis not present

## 2022-06-28 DIAGNOSIS — N3001 Acute cystitis with hematuria: Secondary | ICD-10-CM | POA: Diagnosis not present

## 2022-06-28 DIAGNOSIS — N9089 Other specified noninflammatory disorders of vulva and perineum: Secondary | ICD-10-CM | POA: Diagnosis not present

## 2022-06-28 DIAGNOSIS — Z4689 Encounter for fitting and adjustment of other specified devices: Secondary | ICD-10-CM | POA: Diagnosis not present

## 2022-07-08 DIAGNOSIS — N390 Urinary tract infection, site not specified: Secondary | ICD-10-CM | POA: Diagnosis not present

## 2022-07-08 DIAGNOSIS — Z4689 Encounter for fitting and adjustment of other specified devices: Secondary | ICD-10-CM | POA: Diagnosis not present

## 2022-07-11 ENCOUNTER — Other Ambulatory Visit: Payer: Self-pay | Admitting: Nurse Practitioner

## 2022-07-11 DIAGNOSIS — K219 Gastro-esophageal reflux disease without esophagitis: Secondary | ICD-10-CM

## 2022-07-11 DIAGNOSIS — E785 Hyperlipidemia, unspecified: Secondary | ICD-10-CM

## 2022-08-22 ENCOUNTER — Ambulatory Visit: Payer: Medicare HMO

## 2022-08-29 ENCOUNTER — Encounter: Payer: Medicare HMO | Admitting: Nurse Practitioner

## 2022-09-03 ENCOUNTER — Ambulatory Visit (INDEPENDENT_AMBULATORY_CARE_PROVIDER_SITE_OTHER): Payer: Medicare HMO | Admitting: Family Medicine

## 2022-09-03 ENCOUNTER — Encounter: Payer: Self-pay | Admitting: Family Medicine

## 2022-09-03 VITALS — BP 122/70 | HR 72 | Ht 62.0 in | Wt 128.1 lb

## 2022-09-03 DIAGNOSIS — E569 Vitamin deficiency, unspecified: Secondary | ICD-10-CM | POA: Diagnosis not present

## 2022-09-03 DIAGNOSIS — E559 Vitamin D deficiency, unspecified: Secondary | ICD-10-CM | POA: Diagnosis not present

## 2022-09-03 DIAGNOSIS — Z0001 Encounter for general adult medical examination with abnormal findings: Secondary | ICD-10-CM | POA: Diagnosis not present

## 2022-09-03 DIAGNOSIS — R7301 Impaired fasting glucose: Secondary | ICD-10-CM

## 2022-09-03 DIAGNOSIS — Z23 Encounter for immunization: Secondary | ICD-10-CM

## 2022-09-03 DIAGNOSIS — E038 Other specified hypothyroidism: Secondary | ICD-10-CM

## 2022-09-03 NOTE — Assessment & Plan Note (Signed)
Patient educated on CDC recommendation for the vaccine. Verbal consent was obtained from the patient, vaccine administered by nurse, no sign of adverse reactions noted at this time. Patient education on arm soreness and use of tylenol or ibuprofen for this patient  was discussed. Patient educated on the signs and symptoms of adverse effect and advise to contact the office if they occur.  

## 2022-09-03 NOTE — Progress Notes (Signed)
Complete physical exam  Patient: Colleen Acevedo   DOB: November 01, 1946   76 y.o. Female  MRN: [id number]  Subjective:    Chief Complaint  Patient presents with   Annual Exam    Annual physical and 6 month f/u/. Has questions about pneumococcal 20.    Rafeef Lau is a 76 y.o. female who presents today for a complete physical exam. She reports consuming a general diet. She stays physiclay activey dounbfin yeard work, babysitiinf 4 great grandchild and working in th ehome She generally feels well. She reports sleeping well. She does not have additional problems to discuss today.    Most recent fall risk assessment:    09/03/2022    1:09 PM  [place] in the past year? 1  Number falls in past yr: 0  Injury with Fall? 0  Risk for fall due to : No Fall Risks  Follow up Falls evaluation completed     Most recent depression screenings:    09/03/2022    1:10 PM 03/05/2022    4:15 PM  PHQ 2/9 Scores  PHQ - 2 Score 0 0    Dental: No current dental problems and No regular dental care   Patient Active Problem List   Diagnosis Date Noted   Encounter for annual general medical examination with abnormal findings in adult 09/03/2022   Need for immunization against influenza 09/03/2022   Vaginal discharge 12/20/2021   UTI (urinary tract infection) 11/09/2021   Left knee pain 11/09/2021   GERD (gastroesophageal reflux disease) 07/09/2021   Depression, major, single episode, in partial remission ([place]) 05/10/2021   S/P hysterectomy 04/19/2021   OAB (overactive bladder), diagnosed 11/06/20 04/19/2021   Encounter to establish care 11/01/2020   Vitamin D deficiency 11/01/2020   Parkinson disease 11/01/2020   Seasonal allergies 11/01/2020   Hyperlipidemia 11/01/2020   Neuropathy involving both lower extremities 11/01/2020   Chronic left shoulder pain 11/01/2020   Encounter for pessary maintenance 10/20/2019   Past Medical History:  Diagnosis Date   Anxiety disorder     Mixed hyperlipidemia    Parkinson's disease    Urinary incontinence    uses pessary   Past Surgical History:  Procedure Laterality Date   BACK SURGERY     BREAST BIOPSY Right    KNEE SURGERY     VAGINAL HYSTERECTOMY     Social History   Tobacco Use   Smoking status: Never   Smokeless tobacco: Never  Vaping Use   Vaping Use: Never used  Substance Use Topics   Alcohol use: Never   Drug use: Never   Social History   Socioeconomic History   Marital status: Divorced    Spouse name: Not on file   Number of children: 2   Years of education: Not on file   Highest education level: Not on file  Occupational History    Comment: retired  Tobacco Use   Smoking status: Never   Smokeless tobacco: Never  Vaping Use   Vaping Use: Never used  Substance and Sexual Activity   Alcohol use: Never   Drug use: Never   Sexual activity: Not Currently    Birth control/protection: Surgical    Comment: hyst  Other Topics Concern   Not on file  Social History Narrative   Lives at home alone   Right handed   Caffeine: occasional   Social Determinants of Health   Financial Resource Strain: Low Risk  (08/21/2021)   Overall Emergency planning/management officer Strain (  CARDIA)    Difficulty of Paying Living Expenses: Not hard at all  Food Insecurity: No Food Insecurity (08/21/2021)   Hunger Vital Sign    Worried About Running Out of Food in the Last Year: Never true    Ran Out of Food in the Last Year: Never true  Transportation Needs: No Transportation Needs (08/21/2021)   PRAPARE - Hydrologist (Medical): No    Lack of Transportation (Non-Medical): No  Physical Activity: Insufficiently Active (08/21/2021)   Exercise Vital Sign    Days of Exercise per Week: 2 days    Minutes of Exercise per Session: 30 min  Stress: No Stress Concern Present (08/21/2021)   [place]    Feeling of Stress : Not at all  Social  Connections: Socially Isolated (08/21/2021)   Social Connection and Isolation Panel [NHANES]    Frequency of Communication with Friends and Family: More than three times a week    Frequency of Social Gatherings with Friends and Family: More than three times a week    Attends Religious Services: Never    Marine scientist or Organizations: No    Attends Archivist Meetings: Never    Marital Status: Separated  Intimate Partner Violence: Not At Risk (08/21/2021)   Humiliation, Afraid, Rape, and Kick questionnaire    Fear of Current or Ex-Partner: No    Emotionally Abused: No    Physically Abused: No    Sexually Abused: No   Family Status  Relation Name Status   Father  Deceased   Mother  Deceased   Brother  Deceased   Sister  Alive   Sister  Alive   Brother  Deceased   Brother  Alive   Daughter  Alive   Daughter  Alive   Family History  Problem Relation Age of Onset   Colon cancer Father    Diabetes Mother    Heart attack Brother    Colon cancer Sister    Breast cancer Sister    Heart attack Brother    Thyroid disease Daughter    Allergies  Allergen Reactions   Amoxicillin Itching and Rash      Patient Care Team: Alvira Monday, FNP as PCP - General (Family Medicine)   Outpatient Medications Prior to Visit  Medication Sig   Calcium Carbonate-Vitamin D (CALCIUM 500 + D) 500-125 MG-UNIT TABS Take by mouth.   carbidopa-levodopa (SINEMET IR) 25-100 MG tablet Take 2 tablets by mouth 4 (four) times daily.   CRANBERRY PO Take by mouth.   estradiol (ESTRACE) 0.1 MG/GM vaginal cream Place 1 Applicatorful vaginally. Every other day   fesoterodine (TOVIAZ) 8 MG TB24 tablet Take 1 tablet (8 mg total) by mouth daily.   loratadine (CLARITIN) 10 MG tablet Take 10 mg by mouth daily.   metroNIDAZOLE (METROGEL VAGINAL) 0.75 % vaginal gel Place 1 Applicatorful vaginally at bedtime.   omeprazole (PRILOSEC) 40 MG capsule TAKE 1 CAPSULE EVERY DAY   OVER THE COUNTER  MEDICATION Velbet El energy antler 250 mg   sertraline (ZOLOFT) 25 MG tablet Take 2 tablets (50 mg total) by mouth daily.   simvastatin (ZOCOR) 40 MG tablet TAKE 1 TABLET EVERY DAY   vitamin B-12 (CYANOCOBALAMIN) 100 MCG tablet Take 100 mcg by mouth daily.   [DISCONTINUED] carbidopa-levodopa (SINEMET IR) 25-100 MG tablet TAKE 2 TABLETS 4  TIMES DAILY.   [DISCONTINUED] pregabalin (LYRICA) 50 MG capsule Take 1 capsule (50  mg total) by mouth 3 (three) times daily.   [DISCONTINUED] Propylene Glycol (SYSTANE COMPLETE OP) Apply to eye in the morning and at bedtime.   No facility-administered medications prior to visit.    ROS        Objective:     BP 122/70   Pulse 72   Ht '5\' 2"'  (1.575 m)   Wt 128 lb 1.9 oz (58.1 kg)   SpO2 97%   BMI 23.43 kg/m  BP Readings from Last 3 Encounters:  09/03/22 122/70  04/24/22 122/70  03/14/22 99/65   Wt Readings from Last 3 Encounters:  09/03/22 128 lb 1.9 oz (58.1 kg)  04/24/22 134 lb (60.8 kg)  03/05/22 140 lb (63.5 kg)      Physical Exam   No results found for any visits on 09/03/22. Last CBC Lab Results  Component Value Date   WBC 6.3 11/09/2021   HGB 14.8 11/09/2021   HCT 44.3 11/09/2021   MCV 87 11/09/2021   MCH 29.1 11/09/2021   RDW 13.1 11/09/2021   PLT 217 26/83/4196   Last metabolic panel Lab Results  Component Value Date   GLUCOSE 88 03/08/2022   NA 141 03/08/2022   K 4.6 03/08/2022   CL 103 03/08/2022   CO2 27 03/08/2022   BUN 18 03/08/2022   CREATININE 0.75 03/08/2022   EGFR 82 03/08/2022   CALCIUM 9.9 03/08/2022   PROT 6.1 03/08/2022   ALBUMIN 4.1 03/08/2022   LABGLOB 2.0 03/08/2022   AGRATIO 2.1 03/08/2022   BILITOT 0.5 03/08/2022   ALKPHOS 99 03/08/2022   AST 12 03/08/2022   ALT <5 03/08/2022   Last lipids Lab Results  Component Value Date   CHOL 143 03/08/2022   HDL 53 03/08/2022   LDLCALC 71 03/08/2022   TRIG 107 03/08/2022   CHOLHDL 2.7 03/08/2022   Last hemoglobin A1c No results found  for: "HGBA1C" Last thyroid functions No results found for: "TSH", "T3TOTAL", "T4TOTAL", "THYROIDAB" Last vitamin D Lab Results  Component Value Date   VD25OH 33.5 05/04/2021   Last vitamin B12 and Folate Lab Results  Component Value Date   VITAMINB12 1,973 (H) 11/09/2021        Assessment & Plan:    Routine Health Maintenance and Physical Exam  Immunization History  Administered Date(s) Administered   Fluad Quad(high Dose 65+) 09/03/2022   Influenza Split 09/04/2021   Influenza, High Dose Seasonal PF 09/24/2019   Influenza-Unspecified 09/27/2020   Moderna SARS-COV2 Booster Vaccination 05/10/2021, 10/11/2021   Moderna Sars-Covid-2 Vaccination 12/07/2019, 01/17/2020, 09/18/2020   PNEUMOCOCCAL CONJUGATE-20 09/03/2022    Health Maintenance  Topic Date Due   TETANUS/TDAP  Never done   Zoster Vaccines- Shingrix (1 of 2) Never done   COVID-19 Vaccine (4 - Moderna series) 12/06/2021   Pneumonia Vaccine 58+ Years old  Completed   INFLUENZA VACCINE  Completed   DEXA SCAN  Completed   Hepatitis C Screening  Completed   HPV VACCINES  Aged Out    Discussed health benefits of physical activity, and encouraged her to engage in regular exercise appropriate for her age and condition.  Problem List Items Addressed This Visit       Other   Vitamin D deficiency   Relevant Orders   VITAMIN D 25 Hydroxy (Vit-D Deficiency, Fractures)   Encounter for annual general medical examination with abnormal findings in adult - Primary    Physical exam as documented Counseling is done on healthy lifestyle involving commitment to 150 minutes of exercise  per week,  Discussed heart-healthy diet and attaining a healthy weight Changes in health habits are decided on by the patient with goals and time frames set for achieving them. Immunization and cancer screening needs are specifically addressed at this visit           Need for immunization against influenza    Patient educated on CDC  recommendation for the vaccine. Verbal consent was obtained from the patient, vaccine administered by nurse, no sign of adverse reactions noted at this time. Patient education on arm soreness and use of tylenol or ibuprofen for this patient  was discussed. Patient educated on the signs and symptoms of adverse effect and advise to contact the office if they occur.      Other Visit Diagnoses     Flu vaccine need       Relevant Orders   Flu Vaccine QUAD High Dose(Fluad) (Completed)   Immunization due       Relevant Orders   Pneumococcal conjugate vaccine 20-valent (Completed)   IFG (impaired fasting glucose)       Relevant Orders   CBC with Differential/Platelet   CMP14+EGFR   Lipid Profile   Hemoglobin A1C   Other specified hypothyroidism       Relevant Orders   TSH + free T4      Return in about 3 months (around 12/04/2022).     Alvira Monday, FNP

## 2022-09-03 NOTE — Assessment & Plan Note (Signed)
Physical exam as documented Counseling is done on healthy lifestyle involving commitment to 150 minutes of exercise per week,  Discussed heart-healthy diet and attaining a healthy weight Changes in health habits are decided on by the patient with goals and time frames set for achieving them. Immunization and cancer screening needs are specifically addressed at this visit     

## 2022-09-03 NOTE — Patient Instructions (Addendum)
I appreciate the opportunity to provide care to you today!    Follow up:  3 months  Fasting Labs: please stop by the lab during the week to get your blood drawn (CBC, CMP, TSH, Lipid profile, HgA1c, Vit D)     Please continue to a heart-healthy diet and increase your physical activities. Try to exercise for 30mins at least three times a week.      It was a pleasure to see you and I look forward to continuing to work together on your health and well-being. Please do not hesitate to call the office if you need care or have questions about your care.   Have a wonderful day and week. With Gratitude, Teleshia Lemere MSN, FNP-BC  

## 2022-09-11 DIAGNOSIS — E038 Other specified hypothyroidism: Secondary | ICD-10-CM | POA: Diagnosis not present

## 2022-09-11 DIAGNOSIS — E559 Vitamin D deficiency, unspecified: Secondary | ICD-10-CM | POA: Diagnosis not present

## 2022-09-11 DIAGNOSIS — R7301 Impaired fasting glucose: Secondary | ICD-10-CM | POA: Diagnosis not present

## 2022-09-12 LAB — CBC WITH DIFFERENTIAL/PLATELET
Basophils Absolute: 0.1 10*3/uL (ref 0.0–0.2)
Basos: 1 %
EOS (ABSOLUTE): 0.2 10*3/uL (ref 0.0–0.4)
Eos: 2 %
Hematocrit: 38.7 % (ref 34.0–46.6)
Hemoglobin: 13.2 g/dL (ref 11.1–15.9)
Immature Grans (Abs): 0 10*3/uL (ref 0.0–0.1)
Immature Granulocytes: 0 %
Lymphocytes Absolute: 1.5 10*3/uL (ref 0.7–3.1)
Lymphs: 19 %
MCH: 29.9 pg (ref 26.6–33.0)
MCHC: 34.1 g/dL (ref 31.5–35.7)
MCV: 88 fL (ref 79–97)
Monocytes Absolute: 0.6 10*3/uL (ref 0.1–0.9)
Monocytes: 8 %
Neutrophils Absolute: 5.7 10*3/uL (ref 1.4–7.0)
Neutrophils: 70 %
Platelets: 227 10*3/uL (ref 150–450)
RBC: 4.41 x10E6/uL (ref 3.77–5.28)
RDW: 13 % (ref 11.7–15.4)
WBC: 8 10*3/uL (ref 3.4–10.8)

## 2022-09-12 LAB — HEMOGLOBIN A1C
Est. average glucose Bld gHb Est-mCnc: 103 mg/dL
Hgb A1c MFr Bld: 5.2 % (ref 4.8–5.6)

## 2022-09-12 LAB — TSH+FREE T4
Free T4: 1.36 ng/dL (ref 0.82–1.77)
TSH: 2.54 u[IU]/mL (ref 0.450–4.500)

## 2022-09-12 LAB — LIPID PANEL
Chol/HDL Ratio: 2.7 ratio (ref 0.0–4.4)
Cholesterol, Total: 130 mg/dL (ref 100–199)
HDL: 49 mg/dL (ref >39)
LDL Chol Calc (NIH): 68 mg/dL (ref 0–99)
Triglycerides: 58 mg/dL (ref 0–149)
VLDL Cholesterol Cal: 13 mg/dL (ref 5–40)

## 2022-09-12 LAB — CMP14+EGFR
ALT: 3 IU/L (ref 0–32)
AST: 13 IU/L (ref 0–40)
Albumin/Globulin Ratio: 2 (ref 1.2–2.2)
Albumin: 4.1 g/dL (ref 3.8–4.8)
Alkaline Phosphatase: 90 IU/L (ref 44–121)
BUN/Creatinine Ratio: 38 — ABNORMAL HIGH (ref 12–28)
BUN: 24 mg/dL (ref 8–27)
Bilirubin Total: 0.6 mg/dL (ref 0.0–1.2)
CO2: 24 mmol/L (ref 20–29)
Calcium: 9.3 mg/dL (ref 8.7–10.3)
Chloride: 102 mmol/L (ref 96–106)
Creatinine, Ser: 0.64 mg/dL (ref 0.57–1.00)
Globulin, Total: 2.1 g/dL (ref 1.5–4.5)
Glucose: 86 mg/dL (ref 70–99)
Potassium: 4.1 mmol/L (ref 3.5–5.2)
Sodium: 139 mmol/L (ref 134–144)
Total Protein: 6.2 g/dL (ref 6.0–8.5)
eGFR: 90 mL/min/{1.73_m2} (ref >59)

## 2022-09-12 LAB — VITAMIN D 25 HYDROXY (VIT D DEFICIENCY, FRACTURES): Vit D, 25-Hydroxy: 49.2 ng/mL (ref 30.0–100.0)

## 2022-09-12 NOTE — Progress Notes (Signed)
Please inform the patient that her labs are stable.  Continue heart healthy diet, and creasing her fluid consumption, and daily physical activities.

## 2022-09-20 NOTE — Addendum Note (Signed)
Addended byAlvira Monday on: 09/20/2022 05:35 PM   Modules accepted: Level of Service

## 2022-10-15 DIAGNOSIS — H25813 Combined forms of age-related cataract, bilateral: Secondary | ICD-10-CM | POA: Diagnosis not present

## 2022-10-16 DIAGNOSIS — Z4689 Encounter for fitting and adjustment of other specified devices: Secondary | ICD-10-CM | POA: Diagnosis not present

## 2022-10-16 DIAGNOSIS — R8290 Unspecified abnormal findings in urine: Secondary | ICD-10-CM | POA: Diagnosis not present

## 2022-10-16 DIAGNOSIS — N3000 Acute cystitis without hematuria: Secondary | ICD-10-CM | POA: Diagnosis not present

## 2022-10-24 ENCOUNTER — Encounter: Payer: Self-pay | Admitting: Neurology

## 2022-10-24 ENCOUNTER — Ambulatory Visit: Payer: Medicare HMO | Admitting: Neurology

## 2022-10-24 VITALS — BP 123/63 | HR 77 | Ht 63.0 in | Wt 128.0 lb

## 2022-10-24 DIAGNOSIS — G20A2 Parkinson's disease without dyskinesia, with fluctuations: Secondary | ICD-10-CM | POA: Diagnosis not present

## 2022-10-24 MED ORDER — CARBIDOPA-LEVODOPA 25-100 MG PO TABS: 2.0000 | ORAL_TABLET | Freq: Four times a day (QID) | ORAL | 3 refills | Status: DC

## 2022-10-24 NOTE — Patient Instructions (Signed)
It was nice to see you both today.  As discussed, we will keep you on Sinemet 2 pills 4 times daily.  Please follow-up to see one of our nurse practitioners routinely in 6 months.

## 2022-10-24 NOTE — Progress Notes (Signed)
Subjective:    Patient ID: Colleen Acevedo is a 76 y.o. female.  HPI    Interim history:   Ms. Colleen Acevedo is a 76 year old right-handed woman with an underlying medical history of hyperlipidemia, recurrent UTIs, left knee arthritis, and anxiety, who presents for follow-up consultation of her Parkinson's disease.  The patient is accompanied by her son-in-law, Colleen Acevedo, today. I last saw her on 04/24/2022, at which time she reported feeling fairly stable, as of March 2023 she had been divorced, as of mid May 2023 she was able to move back into her home.  Her husband got the car.  She saw orthopedics on 11/14/2021 for left knee arthritis.  She was encouraged to wean off of Lyrica.  She received a steroid injection into the left knee.  In mid February 2023 she sustained a nondisplaced medial ankle fracture and saw orthopedics again.  She was treated with a walking boot.    Today, 10/24/22: She reports feeling stable, overall doing well, appetite okay, she drinks Premier protein, 2 bottles daily but not a whole lot of water, she drinks about 2 bottles of water per day, 16.9 ounce size.  She has not fallen, constipation is not an issue lately.  She takes her levodopa on a regular basis, 2 pills 4 times a day at 8, 12, 4 PM and 8 PM daily.  She denies any side effects.  She helps take care of 4 of her great-grandchildren by staying at their homes respectively several times a week, these are her Daughter's grandchildren. She does not drive.  The patient's allergies, current medications, family history, past medical history, past social history, past surgical history and problem list were reviewed and updated as appropriate.    Previously:      I saw her on 10/24/2021, at which time she reported feeling fairly stable, motorwise.  She had noticed decline in her short-term memory with a tendency to forget conversations or asked the same question again.  She was trying to write more things down.  She reported  left knee swelling and pain.  She was advised to continue with Sinemet 2 pills 4 times daily.   I saw her on 04/11/2021, at which time she reported significant stress.  She had moved in with her daughter who lives next-door, as the patient had separated from her husband.  Her exam was quite stable.  She was advised to continue with generic Sinemet 2 pills 4 times a day.   I saw her on 09/05/2020, at which time she was fairly stable, had no recent falls.  She was advised to follow-up routinely in 6 months.     I saw her on 03/06/2020, at which time she was taking Sinemet 2 pills 4 times daily.  Her husband was worried about her unintentional weight loss.  This was ongoing over a few years.  She was advised to talk to her primary care physician about additional work-up for weight loss.  We talked about fall prevention.  She had fallen twice since her previous visit.  She was advised to hydrate better and to be proactive about constipation issues.  She was advised to continue with Sinemet at the current dose.   I first met her on 11/04/2019, at which time she reported a diagnosis of Parkinson's disease since 2016.  She was on Sinemet, 2 pills in the morning, 3 midday and 2 in the evening.  She was advised change her regimen to 2 pills 4 times a day on a  scheduled 4 hourly basis.   11/04/19: (She) was diagnosed with Parkinson's disease in 2016, while she was still residing in [place].  She has moved to [facility].  She was followed by a neurologist.  I was able to review some of her neurology records from [facility] in [place], [place].  I reviewed the office note from 03/17/2018, at which time the patient was deemed stable as far as her Parkinson's disease and was advised to continue with Sinemet 2 pills 3 times daily.  She was advised to increase it if needed to 2 pills 4 times a day.  I reviewed your office note from 08/11/2019, which you kindly included. She has been on Sinemet since  2016. She had blood work through your office on 08/06/2019 and I reviewed the results, CBC with differential and platelets was unremarkable, CMP showed benign findings, lipid panel showed total cholesterol of 155, triglycerides 82, LDL 16. They moved at the end of June, she is originally from [place], they have a daughter in [place] and one in [place] and they moved to be closer to her.  Daughter lives next door and they have 2 grandchildren from her and also great-grandchildren.  The patient is a non-smoker and does not utilize alcohol and drinks caffeine and limitation, 1 cup of coffee in the morning typically.  She has not had any recent falls thankfully, she has had some intermittent issues with constipation, takes a probiotic and sometimes a laxative.  She admits that she does not drink a whole lot of water, estimates that she drinks about 1 bottle of water per day on average.  She is physically quite active, no issues with memory, mood is stable.  She has no family history of Parkinson's disease. Her symptoms started towards the end of 2015, may be in December with a right hand tremor and feeling nervous.  She has not had a brain scan such as CT or MRI.She has been followed by Dr. Carleene Cooper. She currently takes Sinemet 2 pills at 7 AM, 3 pills at 12 and 2 pills at 8 PM.  She does take the medication often with her meals.     Her Past Medical History Is Significant For: Past Medical History:  Diagnosis Date   Anxiety disorder    Mixed hyperlipidemia    Parkinson's disease    Urinary incontinence    uses pessary    Her Past Surgical History Is Significant For: Past Surgical History:  Procedure Laterality Date   BACK SURGERY     BREAST BIOPSY Right    KNEE SURGERY     VAGINAL HYSTERECTOMY      Her Family History Is Significant For: Family History  Problem Relation Age of Onset   Colon cancer Father    Diabetes Mother    Heart attack Brother    Colon cancer Sister    Breast  cancer Sister    Heart attack Brother    Thyroid disease Daughter     Her Social History Is Significant For: Social History   Socioeconomic History   Marital status: Divorced    Spouse name: Not on file   Number of children: 2   Years of education: Not on file   Highest education level: Not on file  Occupational History    Comment: retired  Tobacco Use   Smoking status: Never   Smokeless tobacco: Never  Vaping Use   Vaping Use: Never used  Substance and Sexual Activity  Alcohol use: Never   Drug use: Never   Sexual activity: Not Currently    Birth control/protection: Surgical    Comment: hyst  Other Topics Concern   Not on file  Social History Narrative   Lives at home alone   Right handed   Caffeine: occasional   Social Determinants of Health   Financial Resource Strain: Low Risk  (08/21/2021)   Overall Financial Resource Strain (CARDIA)    Difficulty of Paying Living Expenses: Not hard at all  Food Insecurity: No Food Insecurity (08/21/2021)   Hunger Vital Sign    Worried About Running Out of Food in the Last Year: Never true    Ran Out of Food in the Last Year: Never true  Transportation Needs: No Transportation Needs (08/21/2021)   PRAPARE - Hydrologist (Medical): No    Lack of Transportation (Non-Medical): No  Physical Activity: Insufficiently Active (08/21/2021)   Exercise Vital Sign    Days of Exercise per Week: 2 days    Minutes of Exercise per Session: 30 min  Stress: No Stress Concern Present (08/21/2021)   [place]    Feeling of Stress : Not at all  Social Connections: Socially Isolated (08/21/2021)   Social Connection and Isolation Panel [NHANES]    Frequency of Communication with Friends and Family: More than three times a week    Frequency of Social Gatherings with Friends and Family: More than three times a week    Attends Religious Services: Never     Marine scientist or Organizations: No    Attends Archivist Meetings: Never    Marital Status: Separated    Her Allergies Are:  Allergies  Allergen Reactions   Amoxicillin Itching and Rash  :   Her Current Medications Are:  Outpatient Encounter Medications as of 10/24/2022  Medication Sig   carbidopa-levodopa (SINEMET IR) 25-100 MG tablet Take 2 tablets by mouth 4 (four) times daily.   CRANBERRY PO Take by mouth.   estradiol (ESTRACE) 0.1 MG/GM vaginal cream Place 1 Applicatorful vaginally. Every other day   fesoterodine (TOVIAZ) 8 MG TB24 tablet Take 1 tablet (8 mg total) by mouth daily.   loratadine (CLARITIN) 10 MG tablet Take 10 mg by mouth daily.   metroNIDAZOLE (METROGEL VAGINAL) 0.75 % vaginal gel Place 1 Applicatorful vaginally at bedtime.   omeprazole (PRILOSEC) 40 MG capsule TAKE 1 CAPSULE EVERY DAY   sertraline (ZOLOFT) 25 MG tablet Take 2 tablets (50 mg total) by mouth daily.   simvastatin (ZOCOR) 40 MG tablet TAKE 1 TABLET EVERY DAY   vitamin B-12 (CYANOCOBALAMIN) 100 MCG tablet Take 100 mcg by mouth daily.   Calcium Carbonate-Vitamin D (CALCIUM 500 + D) 500-125 MG-UNIT TABS Take by mouth. (Patient not taking: Reported on 10/24/2022)   Liberty El energy antler 250 mg (Patient not taking: Reported on 10/24/2022)   No facility-administered encounter medications on file as of 10/24/2022.  :  Review of Systems:  Out of a complete 14 point review of systems, all are reviewed and negative with the exception of these symptoms as listed below:  Review of Systems  Neurological:        Pt is here for Follow Up Parkinson. Pt's Son- In-Law states that she has some hearing loss. Pt states not feeling extra tired when walking. Pt states she is sleeping ok.    Objective:  Neurological Exam  Physical Exam  Physical Examination:   Vitals:   10/24/22 1431  BP: 123/63  Pulse: 77    General Examination: The patient is a very  pleasant 76 y.o. female in no acute distress. She appears well-developed and well-nourished and well groomed.   HEENT: Normocephalic, atraumatic, pupils are round and reactive to light, extraocular tracking is mildly impaired, she wears corrective eyeglasses.  She has bilateral cataracts.  She has mild left ptosis and left pupil is a little larger than the right, but overall stable. She has mild facial masking mild nuchal rigidity is noted, no lip, neck or jaw tremor, airway examination reveals mild mouth dryness, tongue protrudes centrally and palate elevates symmetrically, she has mild hypophonia. No sialorrhea.   Chest: Clear to auscultation without wheezing, rhonchi or crackles noted.   Heart: S1+S2+0, regular and normal without murmurs, rubs or gallops noted.    Abdomen: Soft, non-tender and non-distended.   Extremities: There is no edema in the lower extremities.   Skin: Warm and dry without trophic changes noted.   Musculoskeletal: exam reveals no obvious joint deformities.      Neurologically:  Mental status: The patient is awake, alert and oriented in all 4 spheres. Her immediate and remote memory, attention, language skills and fund of knowledge are appropriate. There is no evidence of aphasia, agnosia, apraxia or anomia. Speech is clear with normal prosody and enunciation. Thought process is linear. Mood is normal and affect is normal.  Cranial nerves II - XII are as described above under HEENT exam.    (On 11/04/2019: On Archimedes spiral drawing she has no tremor with the right hand, fairly good spiral, left hand is insecure, handwriting is legible, not particularly tremulous, slightly on the smaller side.)   Motor exam: Overall, thin bulk, global strength of 4/5, mild intermittent right upper extremity tremor noted at rest, stable.  She has slight increase in tone with cogwheeling noted in the right upper extremity, otherwise good tone on the L. Fine motor skills with finger taps  and foot agility are mildly impaired on the right, better on the left.   Sensory exam is intact to light touch.  Cerebellar testing shows no dysmetria or intention tremor. Gait, station and balance: She stands without major difficulty, posture is slightly stooped for age, left shoulder higher than right.  She walks with decreased stride length and decreased pace, decreased arm swing bilaterally, more so on the R.  Turns well, balance is fairly well preserved.  No walking aid.   Assessment and Plan:    In summary, Colleen Acevedo is a very pleasant 76 year old female with an underlying medical history of hyperlipidemia, and anxiety, who presents for follow-up consultation of her Parkinson's disease with right-sided predominance, complicated by mild constipation. She has responded to levodopa therapy and is currently taking 2 pills 4 times a day on a 4 hourly schedule, starting at 8 AM.  She feels stable, currently doing well, exam is stable as well, she has a good support system.  She currently does not drive and is not keen on resuming driving. She moved back into her home after her divorce.  We talked about the importance of healthy lifestyle, good nutrition, good hydration, staying active mentally and physically.  She reports mild forgetfulness but things are stable, we will continue to monitor.  Her son-in-law has no further concerns with regards to cognitive function or otherwise.  I renewed her Sinemet prescription, she is advised to follow-up in this clinic routinely to see one  of our nurse practitioners in 6 months, sooner if needed.  I answered all their questions today and the patient and her son-in-law Colleen Acevedo were in agreement. I spent 25 minutes in total face-to-face time and in reviewing records during pre-charting, more than 50% of which was spent in counseling and coordination of care, reviewing test results, reviewing medications and treatment regimen and/or in discussing or reviewing the  diagnosis of PD, the prognosis and treatment options. Pertinent laboratory and imaging test results that were available during this visit with the patient were reviewed by me and considered in my medical decision making (see chart for details).

## 2022-11-20 ENCOUNTER — Encounter: Payer: Self-pay | Admitting: Family Medicine

## 2022-11-20 ENCOUNTER — Ambulatory Visit (INDEPENDENT_AMBULATORY_CARE_PROVIDER_SITE_OTHER): Payer: Medicare HMO | Admitting: Family Medicine

## 2022-11-20 VITALS — Ht 62.0 in | Wt 128.0 lb

## 2022-11-20 DIAGNOSIS — Z0000 Encounter for general adult medical examination without abnormal findings: Secondary | ICD-10-CM | POA: Diagnosis not present

## 2022-11-20 NOTE — Progress Notes (Addendum)
Annual Wellness Visit     Patient: Colleen Acevedo, Female    DOB: 01/07/1946, 76 y.o.   MRN: [id number]  Subjective  Chief Complaint  Patient presents with   Annual Exam    Colleen Acevedo is a 76 y.o. female who presents today for her Annual Wellness Visit. She reports consuming a general and high protein diet.  Exercise once a week for 20 minutes   She generally feels well. She reports sleeping fairly well. She does not have additional problems to discuss today.    Patient Location: At home. Provider Location: [facility] Total time spent with patient  via telehealth/audio 15 minutes    HPI  Vision:Within last year   Patient Active Problem List   Diagnosis Date Noted   Encounter for annual general medical examination with abnormal findings in adult 09/03/2022   Need for immunization against influenza 09/03/2022   Vaginal discharge 12/20/2021   UTI (urinary tract infection) 11/09/2021   Left knee pain 11/09/2021   GERD (gastroesophageal reflux disease) 07/09/2021   Depression, major, single episode, in partial remission ([place]) 05/10/2021   S/P hysterectomy 04/19/2021   OAB (overactive bladder), diagnosed 11/06/20 04/19/2021   Encounter to establish care 11/01/2020   Vitamin D deficiency 11/01/2020   Parkinson disease 11/01/2020   Seasonal allergies 11/01/2020   Hyperlipidemia 11/01/2020   Neuropathy involving both lower extremities 11/01/2020   Chronic left shoulder pain 11/01/2020   Encounter for pessary maintenance 10/20/2019      Medications: Outpatient Medications Prior to Visit  Medication Sig   Calcium Carbonate-Vitamin D (CALCIUM 500 + D) 500-125 MG-UNIT TABS Take by mouth.   carbidopa-levodopa (SINEMET IR) 25-100 MG tablet Take 2 tablets by mouth 4 (four) times daily.   CRANBERRY PO Take by mouth.   estradiol (ESTRACE) 0.1 MG/GM vaginal cream Place 1 Applicatorful vaginally. Every other day   fesoterodine (TOVIAZ) 8 MG TB24  tablet Take 1 tablet (8 mg total) by mouth daily.   loratadine (CLARITIN) 10 MG tablet Take 10 mg by mouth daily.   metroNIDAZOLE (METROGEL VAGINAL) 0.75 % vaginal gel Place 1 Applicatorful vaginally at bedtime.   omeprazole (PRILOSEC) 40 MG capsule TAKE 1 CAPSULE EVERY DAY   OVER THE COUNTER MEDICATION Velbet El energy antler 250 mg   sertraline (ZOLOFT) 25 MG tablet Take 2 tablets (50 mg total) by mouth daily.   simvastatin (ZOCOR) 40 MG tablet TAKE 1 TABLET EVERY DAY   vitamin B-12 (CYANOCOBALAMIN) 100 MCG tablet Take 100 mcg by mouth daily.   No facility-administered medications prior to visit.    Allergies  Allergen Reactions   Amoxicillin Itching and Rash    Patient Care Team: Alvira Monday, FNP as PCP - General (Family Medicine)  ROS      Objective  Ht _0  (1.575 m)   Wt 128 lb (58.1 kg)   BMI 23.41 kg/m  BP Readings from Last 3 Encounters:  10/24/22 123/63  09/03/22 122/70  04/24/22 122/70      Physical Exam    Most recent functional status assessment:     No data to display         Most recent fall risk assessment:    11/20/2022    9:06 AM  [place] in the past year? 0  Number falls in past yr: 0  Injury with Fall? 0  Risk for fall due to : No Fall Risks  Follow up Falls evaluation completed    Most recent  depression screenings:    11/20/2022    9:06 AM 09/03/2022    1:10 PM  PHQ 2/9 Scores  PHQ - 2 Score 0 0  PHQ- 9 Score 1    Most recent cognitive screening:    08/21/2021    3:54 PM  6CIT Screen  What Year? 0 points  What month? 0 points  What time? 0 points  Count back from 20 0 points  Months in reverse 0 points  Repeat phrase 0 points  Total Score 0 points   Most recent Audit-C alcohol use screening    08/21/2021    3:53 PM  Alcohol Use Disorder Test (AUDIT)  1. How often do you have a drink containing alcohol? 0  2. How many drinks containing alcohol do you have on a typical day when you are drinking? 0   3. How often do you have six or more drinks on one occasion? 0  AUDIT-C Score 0   A score of 3 or more in women, and 4 or more in men indicates increased risk for alcohol abuse, EXCEPT if all of the points are from question 1   Vision/Hearing Screen: No results found.  Last CBC Lab Results  Component Value Date   WBC 8.0 09/11/2022   HGB 13.2 09/11/2022   HCT 38.7 09/11/2022   MCV 88 09/11/2022   MCH 29.9 09/11/2022   RDW 13.0 09/11/2022   PLT 227 69/79/4801   Last metabolic panel Lab Results  Component Value Date   GLUCOSE 86 09/11/2022   NA 139 09/11/2022   K 4.1 09/11/2022   CL 102 09/11/2022   CO2 24 09/11/2022   BUN 24 09/11/2022   CREATININE 0.64 09/11/2022   EGFR 90 09/11/2022   CALCIUM 9.3 09/11/2022   PROT 6.2 09/11/2022   ALBUMIN 4.1 09/11/2022   LABGLOB 2.1 09/11/2022   AGRATIO 2.0 09/11/2022   BILITOT 0.6 09/11/2022   ALKPHOS 90 09/11/2022   AST 13 09/11/2022   ALT 3 09/11/2022   Last lipids Lab Results  Component Value Date   CHOL 130 09/11/2022   HDL 49 09/11/2022   LDLCALC 68 09/11/2022   TRIG 58 09/11/2022   CHOLHDL 2.7 09/11/2022   Last hemoglobin A1c Lab Results  Component Value Date   HGBA1C 5.2 09/11/2022   Last thyroid functions Lab Results  Component Value Date   TSH 2.540 09/11/2022   Last vitamin D Lab Results  Component Value Date   VD25OH 49.2 09/11/2022   Last vitamin B12 and Folate Lab Results  Component Value Date   VITAMINB12 1,973 (H) 11/09/2021      No results found for any visits on 11/20/22.    Assessment & Plan   Annual wellness visit done today including the all of the following: Reviewed patient's Family Medical History Reviewed and updated list of patient's medical providers Assessment of cognitive impairment was done Assessed patient's functional ability Established a written schedule for health screening [place] Completed and Reviewed  Exercise Activities and Dietary  recommendations  Goals   None     Immunization History  Administered Date(s) Administered   Fluad Quad(high Dose 65+) 09/03/2022   Influenza Split 09/04/2021   Influenza, High Dose Seasonal PF 09/24/2019   Influenza-Unspecified 09/27/2020   Moderna SARS-COV2 Booster Vaccination 05/10/2021, 10/11/2021   Moderna Sars-Covid-2 Vaccination 12/07/2019, 01/17/2020, 09/18/2020   PNEUMOCOCCAL CONJUGATE-20 09/03/2022    Health Maintenance  Topic Date Due   DTaP/Tdap/Td (1 - Tdap) Never done  Zoster Vaccines- Shingrix (1 of 2) Never done   COVID-19 Vaccine (6 - 2023-24 season) 07/26/2022   Medicare Annual Wellness (AWV)  11/21/2023   Pneumonia Vaccine 37+ Years old  Completed   INFLUENZA VACCINE  Completed   DEXA SCAN  Completed   Hepatitis C Screening  Completed   HPV VACCINES  Aged Out     Discussed health benefits of physical activity, and encouraged her to engage in regular exercise appropriate for her age and condition.    Problem List Items Addressed This Visit   None Visit Diagnoses     Encounter for Medicare annual wellness exam    -  Primary       Return in 1 year (on 11/21/2023).     Renard Hamper Ria Comment, FNP

## 2022-11-30 ENCOUNTER — Other Ambulatory Visit: Payer: Self-pay | Admitting: Urology

## 2022-11-30 DIAGNOSIS — N3941 Urge incontinence: Secondary | ICD-10-CM

## 2022-12-03 ENCOUNTER — Encounter: Payer: Self-pay | Admitting: Family Medicine

## 2022-12-03 ENCOUNTER — Telehealth (INDEPENDENT_AMBULATORY_CARE_PROVIDER_SITE_OTHER): Payer: Medicare HMO | Admitting: Family Medicine

## 2022-12-03 VITALS — Ht 63.0 in | Wt 135.0 lb

## 2022-12-03 DIAGNOSIS — B349 Viral infection, unspecified: Secondary | ICD-10-CM | POA: Diagnosis not present

## 2022-12-03 MED ORDER — PROMETHAZINE-DM 6.25-15 MG/5ML PO SYRP: 5.0000 mL | ORAL_SOLUTION | Freq: Four times a day (QID) | ORAL | 0 refills | Status: DC | PRN

## 2022-12-03 MED ORDER — BENZONATATE 100 MG PO CAPS: 100.0000 mg | ORAL_CAPSULE | Freq: Two times a day (BID) | ORAL | 0 refills | Status: DC | PRN

## 2022-12-03 NOTE — Progress Notes (Signed)
Virtual Visit via Telephone Note   This visit type was conducted via telephone. This format is felt to be most appropriate for this patient at this time.  The patient did not have access to video technology/had technical difficulties with video requiring transitioning to audio format only (telephone).  All issues noted in this document were discussed and addressed.  No physical exam could be performed with this format.  Evaluation Performed:  Follow-up visit  Date:  12/03/2022   ID:  Sarin Comunale, DOB February 19, 1946, MRN [id number]  Patient Location: Home Provider Location: Clinic  Participants: Patient Location of Patient: Home Location of Provider: Clinic Consent was obtain for visit to be over via telehealth. I verified that I am speaking with the correct person using two identifiers.  PCP:  Alvira Monday, FNP   Chief Complaint:  cough  History of Present Illness:    Kaija Kovacevic is a 77 y.o. female with complaints of sneezing, coughing and runny nose.  Onset of symptoms 11/19/2022.  She denies fever, chills, wheezing, shortness of breath, diarrhea, headaches, muscle aches, and fatigue.  She reports taking NyQuil and DayQuil with some cough drops.     The patient does have symptoms concerning for COVID-19 infection (fever, chills, cough, or new shortness of breath).   Past Medical, Surgical, Social History, Allergies, and Medications have been Reviewed.  Past Medical History:  Diagnosis Date   Anxiety disorder    Mixed hyperlipidemia    Parkinson's disease    Urinary incontinence    uses pessary   Past Surgical History:  Procedure Laterality Date   BACK SURGERY     BREAST BIOPSY Right    KNEE SURGERY     VAGINAL HYSTERECTOMY       Current Meds  Medication Sig   benzonatate (TESSALON) 100 MG capsule Take 1 capsule (100 mg total) by mouth 2 (two) times daily as needed for cough.   Calcium Carbonate-Vitamin D (CALCIUM 500 + D) 500-125 MG-UNIT TABS Take  by mouth.   carbidopa-levodopa (SINEMET IR) 25-100 MG tablet Take 2 tablets by mouth 4 (four) times daily.   CRANBERRY PO Take by mouth.   estradiol (ESTRACE) 0.1 MG/GM vaginal cream Place 1 Applicatorful vaginally. Every other day   fesoterodine (TOVIAZ) 8 MG TB24 tablet Take 1 tablet (8 mg total) by mouth daily.   loratadine (CLARITIN) 10 MG tablet Take 10 mg by mouth daily.   metroNIDAZOLE (METROGEL VAGINAL) 0.75 % vaginal gel Place 1 Applicatorful vaginally at bedtime.   omeprazole (PRILOSEC) 40 MG capsule TAKE 1 CAPSULE EVERY DAY   OVER THE COUNTER MEDICATION Velbet El energy antler 250 mg   promethazine-dextromethorphan (PROMETHAZINE-DM) 6.25-15 MG/5ML syrup Take 5 mLs by mouth 4 (four) times daily as needed for cough.   sertraline (ZOLOFT) 25 MG tablet Take 2 tablets (50 mg total) by mouth daily.   simvastatin (ZOCOR) 40 MG tablet TAKE 1 TABLET EVERY DAY   vitamin B-12 (CYANOCOBALAMIN) 100 MCG tablet Take 100 mcg by mouth daily.     Allergies:   Amoxicillin   ROS:   Please see the history of present illness.     All other systems reviewed and are negative.   Labs/Other Tests and Data Reviewed:    Recent Labs: 09/11/2022: ALT 3; BUN 24; Creatinine, Ser 0.64; Hemoglobin 13.2; Platelets 227; Potassium 4.1; Sodium 139; TSH 2.540   Recent Lipid Panel Lab Results  Component Value Date/Time   CHOL 130 09/11/2022 09:36 AM   TRIG 58 09/11/2022  09:36 AM   HDL 49 09/11/2022 09:36 AM   CHOLHDL 2.7 09/11/2022 09:36 AM   LDLCALC 68 09/11/2022 09:36 AM    Wt Readings from Last 3 Encounters:  12/03/22 135 lb (61.2 kg)  11/20/22 128 lb (58.1 kg)  10/24/22 128 lb (58.1 kg)     Objective:    Vital Signs:  Ht 5\' 3"  (1.6 m)   Wt 135 lb (61.2 kg)   BMI 23.91 kg/m      ASSESSMENT & PLAN:   Viral illness Patient is outside the window to be tested for COVID and flu Encouraged symptomatic management Encouraged to continue taking Claritin 10 mg daily Promethazine DM  order Tessalon Perles ordered for cough Take medication as prescribed. Increase fluids and allow for plenty of rest. Recommend using a humidifier at bedtime during sleep to help with cough and nasal congestion. Follow-up if your symptoms do not improve    Time:   Today, I have spent 10 minutes reviewing the chart, including problem list, medications, and with the patient with telehealth technology discussing the above problems.   Medication Adjustments/Labs and Tests Ordered: Current medicines are reviewed at length with the patient today.  Concerns regarding medicines are outlined above.   Tests Ordered: No orders of the defined types were placed in this encounter.   Medication Changes: Meds ordered this encounter  Medications   benzonatate (TESSALON) 100 MG capsule    Sig: Take 1 capsule (100 mg total) by mouth 2 (two) times daily as needed for cough.    Dispense:  20 capsule    Refill:  0   promethazine-dextromethorphan (PROMETHAZINE-DM) 6.25-15 MG/5ML syrup    Sig: Take 5 mLs by mouth 4 (four) times daily as needed for cough.    Dispense:  118 mL    Refill:  0     Note: This dictation was prepared with Dragon dictation along with smaller phrase technology. Similar sounding words can be transcribed inadequately or may not be corrected upon review. Any transcriptional errors that result from this process are unintentional.      Disposition:  Follow up  Signed, 01-27-1993, FNP  12/03/2022 8:47 PM     02/01/2023 [facility]

## 2022-12-04 ENCOUNTER — Ambulatory Visit: Payer: Medicare HMO | Admitting: Family Medicine

## 2022-12-12 ENCOUNTER — Ambulatory Visit: Payer: Medicare HMO | Admitting: Family Medicine

## 2022-12-16 ENCOUNTER — Telehealth: Payer: Self-pay | Admitting: Family Medicine

## 2022-12-16 ENCOUNTER — Other Ambulatory Visit: Payer: Self-pay | Admitting: Family Medicine

## 2022-12-16 DIAGNOSIS — R350 Frequency of micturition: Secondary | ICD-10-CM

## 2022-12-16 DIAGNOSIS — B349 Viral infection, unspecified: Secondary | ICD-10-CM

## 2022-12-16 MED ORDER — BENZONATATE 100 MG PO CAPS: 100.0000 mg | ORAL_CAPSULE | Freq: Two times a day (BID) | ORAL | 0 refills | Status: DC | PRN

## 2022-12-16 NOTE — Telephone Encounter (Signed)
Pt informed.

## 2022-12-16 NOTE — Telephone Encounter (Signed)
Patient called in requesting refill on    benzonatate (TESSALON) 100 MG capsule   Patient wants a call back in regard

## 2022-12-16 NOTE — Telephone Encounter (Signed)
Pt still having ongoing cough wants a refill?

## 2022-12-16 NOTE — Telephone Encounter (Signed)
Rx sent 

## 2022-12-18 ENCOUNTER — Ambulatory Visit (INDEPENDENT_AMBULATORY_CARE_PROVIDER_SITE_OTHER): Payer: Medicare HMO

## 2022-12-18 VITALS — BP 129/71 | HR 78 | Ht 62.0 in | Wt 123.0 lb

## 2022-12-18 DIAGNOSIS — Z0000 Encounter for general adult medical examination without abnormal findings: Secondary | ICD-10-CM | POA: Diagnosis not present

## 2022-12-18 NOTE — Progress Notes (Signed)
Subjective:   Colleen Acevedo is a 77 y.o. female who presents for Medicare Annual (Subsequent) preventive examination.  Review of Systems        Objective:    There were no vitals filed for this visit. There is no height or weight on file to calculate BMI.      No data to display           Current Medications (verified) Outpatient Encounter Medications as of 12/18/2022  Medication Sig   benzonatate (TESSALON) 100 MG capsule Take 1 capsule (100 mg total) by mouth 2 (two) times daily as needed for cough.   Calcium Carbonate-Vitamin D (CALCIUM 500 + D) 500-125 MG-UNIT TABS Take by mouth.   carbidopa-levodopa (SINEMET IR) 25-100 MG tablet Take 2 tablets by mouth 4 (four) times daily.   CRANBERRY PO Take by mouth.   estradiol (ESTRACE) 0.1 MG/GM vaginal cream Place 1 Applicatorful vaginally. Every other day   fesoterodine (TOVIAZ) 8 MG TB24 tablet TAKE 1 TABLET EVERY DAY   loratadine (CLARITIN) 10 MG tablet Take 10 mg by mouth daily.   metroNIDAZOLE (METROGEL VAGINAL) 0.75 % vaginal gel Place 1 Applicatorful vaginally at bedtime.   omeprazole (PRILOSEC) 40 MG capsule TAKE 1 CAPSULE EVERY DAY   OVER THE COUNTER MEDICATION Velbet El energy antler 250 mg   promethazine-dextromethorphan (PROMETHAZINE-DM) 6.25-15 MG/5ML syrup Take 5 mLs by mouth 4 (four) times daily as needed for cough.   sertraline (ZOLOFT) 25 MG tablet Take 2 tablets (50 mg total) by mouth daily.   simvastatin (ZOCOR) 40 MG tablet TAKE 1 TABLET EVERY DAY   vitamin B-12 (CYANOCOBALAMIN) 100 MCG tablet Take 100 mcg by mouth daily.   No facility-administered encounter medications on file as of 12/18/2022.    Allergies (verified) Amoxicillin   History: Past Medical History:  Diagnosis Date   Anxiety disorder    Mixed hyperlipidemia    Parkinson's disease    Urinary incontinence    uses pessary   Past Surgical History:  Procedure Laterality Date   BACK SURGERY     BREAST BIOPSY Right    KNEE SURGERY      VAGINAL HYSTERECTOMY     Family History  Problem Relation Age of Onset   Colon cancer Father    Diabetes Mother    Heart attack Brother    Colon cancer Sister    Breast cancer Sister    Heart attack Brother    Thyroid disease Daughter    Social History   Socioeconomic History   Marital status: Divorced    Spouse name: Not on file   Number of children: 2   Years of education: Not on file   Highest education level: Not on file  Occupational History    Comment: retired  Tobacco Use   Smoking status: Never   Smokeless tobacco: Never  Vaping Use   Vaping Use: Never used  Substance and Sexual Activity   Alcohol use: Never   Drug use: Never   Sexual activity: Not Currently    Birth control/protection: Surgical    Comment: hyst  Other Topics Concern   Not on file  Social History Narrative   Lives at home alone   Right handed   Caffeine: occasional   Social Determinants of Health   Financial Resource Strain: Low Risk  (11/20/2022)   Overall Financial Resource Strain (CARDIA)    Difficulty of Paying Living Expenses: Not hard at all  Food Insecurity: No Food Insecurity (11/20/2022)   Hunger Vital  Sign    Worried About Charity fundraiser in the Last Year: Never true    [place] in the Last Year: Never true  Transportation Needs: No Transportation Needs (11/20/2022)   PRAPARE - Hydrologist (Medical): No    Lack of Transportation (Non-Medical): No  Physical Activity: Insufficiently Active (08/21/2021)   Exercise Vital Sign    Days of Exercise per Week: 2 days    Minutes of Exercise per Session: 30 min  Stress: No Stress Concern Present (11/20/2022)   [place]    Feeling of Stress : Not at all  Social Connections: Moderately Isolated (11/20/2022)   Social Connection and Isolation Panel [NHANES]    Frequency of Communication with Friends and Family: More than three  times a week    Frequency of Social Gatherings with Friends and Family: More than three times a week    Attends Religious Services: Never    Marine scientist or Organizations: No    Attends Music therapist: 1 to 4 times per year    Marital Status: Divorced    Tobacco Counseling Counseling given: Not Answered   Clinical Intake:  Diabetic? no         Activities of Daily Living     No data to display           Patient Care Team: Alvira Monday, FNP as PCP - General (Family Medicine)  Indicate any recent Medical Services you may have received from other than [facility] providers in the past year (date may be approximate).     Assessment:   This is a routine wellness examination for [name].  Hearing/Vision screen No results found.  Dietary issues and exercise activities discussed:     Goals Addressed   None   Depression Screen    12/03/2022    1:20 PM 11/20/2022    9:37 AM 11/20/2022    9:06 AM 09/03/2022    1:10 PM 03/05/2022    4:15 PM 11/09/2021   10:55 AM 08/21/2021    3:50 PM  PHQ 2/9 Scores  PHQ - 2 Score 0 0 0 0 0 0 0  PHQ- 9 Score 0 1 1        Fall Risk    12/03/2022    1:20 PM 11/20/2022    9:06 AM 10/24/2022    2:19 PM 09/03/2022    1:09 PM 03/05/2022    4:15 PM  [place] in the past year? 0 0 0 1 0  Number falls in past yr: 0 0 0 0 0  Injury with Fall? 0 0 0 0 0  Risk for fall due to : No Fall Risks No Fall Risks No Fall Risks No Fall Risks No Fall Risks  Follow up Falls evaluation completed Falls evaluation completed  Falls evaluation completed Falls evaluation completed    FALL RISK PREVENTION PERTAINING TO THE HOME:  Any stairs in or around the home? Yes  If so, are there any without handrails? Yes  Home free of loose throw rugs in walkways, pet beds, electrical cords, etc? Yes  Adequate lighting in your home to reduce risk of falls? Yes   ASSISTIVE DEVICES UTILIZED TO PREVENT FALLS:  Life alert? No   Use of a cane, walker or w/c? No  Grab bars in the bathroom? No  Shower chair or bench in shower? Yes  Elevated toilet  seat or a handicapped toilet? No   TIMED UP AND GO:  Was the test performed? Yes .  Length of time to ambulate 10 feet: 5 sec.   Gait steady and fast without use of assistive device  Cognitive Function:        08/21/2021    3:54 PM  6CIT Screen  What Year? 0 points  What month? 0 points  What time? 0 points  Count back from 20 0 points  Months in reverse 0 points  Repeat phrase 0 points  Total Score 0 points    Immunizations Immunization History  Administered Date(s) Administered   Fluad Quad(high Dose 65+) 09/03/2022   Influenza Split 09/04/2021   Influenza, High Dose Seasonal PF 09/24/2019   Influenza-Unspecified 09/27/2020   Moderna SARS-COV2 Booster Vaccination 05/10/2021, 10/11/2021   Moderna Sars-Covid-2 Vaccination 12/07/2019, 01/17/2020, 09/18/2020   PNEUMOCOCCAL CONJUGATE-20 09/03/2022    TDAP status: Due, Education has been provided regarding the importance of this vaccine. Advised may receive this vaccine at local pharmacy or Health Dept. Aware to provide a copy of the vaccination record if obtained from local pharmacy or Health Dept. Verbalized acceptance and understanding.  Flu Vaccine status: Up to date  Pneumococcal vaccine status: Up to date  Covid-19 vaccine status: Completed vaccines  Qualifies for Shingles Vaccine? Yes   Zostavax completed No   Shingrix Completed?: No.    Education has been provided regarding the importance of this vaccine. Patient has been advised to call insurance company to determine out of pocket expense if they have not yet received this vaccine. Advised may also receive vaccine at local pharmacy or Health Dept. Verbalized acceptance and understanding.  Screening Tests Health Maintenance  Topic Date Due   DTaP/Tdap/Td (1 - Tdap) Never done   Zoster Vaccines- Shingrix (1 of 2) Never done   COVID-19  Vaccine (6 - 2023-24 season) 07/26/2022   Medicare Annual Wellness (AWV)  12/19/2023   Pneumonia Vaccine 44+ Years old  Completed   INFLUENZA VACCINE  Completed   DEXA SCAN  Completed   Hepatitis C Screening  Completed   HPV VACCINES  Aged Out    Health Maintenance  Health Maintenance Due  Topic Date Due   DTaP/Tdap/Td (1 - Tdap) Never done   Zoster Vaccines- Shingrix (1 of 2) Never done   COVID-19 Vaccine (6 - 2023-24 season) 07/26/2022    Colorectal cancer screening: Type of screening: Colonoscopy. Completed 2021. Repeat every 10 years  Mammogram status: Completed 2022. Repeat every year  Bone Density status: Completed 2022. Results reflect: Bone density results: OSTEOPENIA. Repeat every 2 years.  Lung Cancer Screening: (Low Dose CT Chest recommended if Age 44-80 years, 30 pack-year currently smoking OR have quit w/in 15years.) does not qualify.   Lung Cancer Screening Referral: NA  Additional Screening:  Hepatitis C Screening: does not qualify; Completed 2022  Vision Screening: Recommended annual ophthalmology exams for early detection of glaucoma and other disorders of the eye. Is the patient up to date with their annual eye exam?  Yes  Who is the provider or what is the name of the office in which the patient attends annual eye exams? [facility] If pt is not established with a provider, would they like to be referred to a provider to establish care? No .   Dental Screening: Recommended annual dental exams for proper oral hygiene  Community Resource Referral / Chronic Care Management: CRR required this visit?  No   CCM required this visit?  No      Plan:     I have personally reviewed and noted the following in the patient's chart:   Medical and social history Use of alcohol, tobacco or illicit drugs  Current medications and supplements including opioid prescriptions. Patient is not currently taking opioid prescriptions. Functional ability and  status Nutritional status Physical activity Advanced directives List of other physicians Hospitalizations, surgeries, and ER visits in previous 12 months Vitals Screenings to include cognitive, depression, and falls Referrals and appointments  In addition, I have reviewed and discussed with patient certain preventive protocols, quality metrics, and best practice recommendations. A written personalized care plan for preventive services as well as general preventive health recommendations were provided to patient.     Smitty Knudsen, CMA   12/18/2022

## 2022-12-18 NOTE — Patient Instructions (Signed)
  Colleen Acevedo , Thank you for taking time to come for your Medicare Wellness Visit. I appreciate your ongoing commitment to your health goals. Please review the following plan we discussed and let me know if I can assist you in the future.   These are the goals we discussed:  Goals      Activity and Exercise Increased     Evidence-based guidance:  Review current exercise levels.  Assess patient perspective on exercise or activity level, barriers to increasing activity, motivation and readiness for change.  Recommend or set healthy exercise goal based on individual tolerance.  Encourage small steps toward making change in amount of exercise or activity.  Urge reduction of sedentary activities or screen time.  Promote group activities within the community or with family or support person.  Consider referral to rehabiliation therapist for assessment and exercise/activity plan.   Notes:         This is a list of the screening recommended for you and due dates:  Health Maintenance  Topic Date Due   DTaP/Tdap/Td vaccine (1 - Tdap) Never done   Zoster (Shingles) Vaccine (1 of 2) Never done   COVID-19 Vaccine (6 - 2023-24 season) 07/26/2022   Medicare Annual Wellness Visit  12/19/2023   Pneumonia Vaccine  Completed   Flu Shot  Completed   DEXA scan (bone density measurement)  Completed   Hepatitis C Screening: USPSTF Recommendation to screen - Ages 68-79 yo.  Completed   HPV Vaccine  Aged Out

## 2023-01-01 ENCOUNTER — Encounter: Payer: Self-pay | Admitting: Family Medicine

## 2023-01-01 ENCOUNTER — Telehealth (INDEPENDENT_AMBULATORY_CARE_PROVIDER_SITE_OTHER): Payer: Medicare HMO | Admitting: Family Medicine

## 2023-01-01 DIAGNOSIS — R350 Frequency of micturition: Secondary | ICD-10-CM | POA: Diagnosis not present

## 2023-01-01 DIAGNOSIS — N390 Urinary tract infection, site not specified: Secondary | ICD-10-CM | POA: Diagnosis not present

## 2023-01-01 LAB — POCT URINALYSIS DIP (CLINITEK)
Bilirubin, UA: NEGATIVE
Blood, UA: NEGATIVE
Glucose, UA: NEGATIVE mg/dL
Ketones, POC UA: NEGATIVE mg/dL
Nitrite, UA: POSITIVE — AB
POC PROTEIN,UA: NEGATIVE
Spec Grav, UA: 1.01 (ref 1.010–1.025)
Urobilinogen, UA: 0.2 E.U./dL
pH, UA: 6.5 (ref 5.0–8.0)

## 2023-01-01 IMAGING — MG MM DIGITAL SCREENING BILAT W/ TOMO AND CAD
8 series · 9 of 24 positions shown · non-contrast
Comparison: Previous exam(s).

CLINICAL DATA: Screening.

EXAM:
DIGITAL SCREENING BILATERAL MAMMOGRAM WITH TOMOSYNTHESIS AND CAD
TECHNIQUE: Bilateral screening digital craniocaudal and mediolateral oblique
mammograms were obtained. Bilateral screening digital breast
tomosynthesis was performed. The images were evaluated with
computer-aided detection.

[L CC synth-2D]
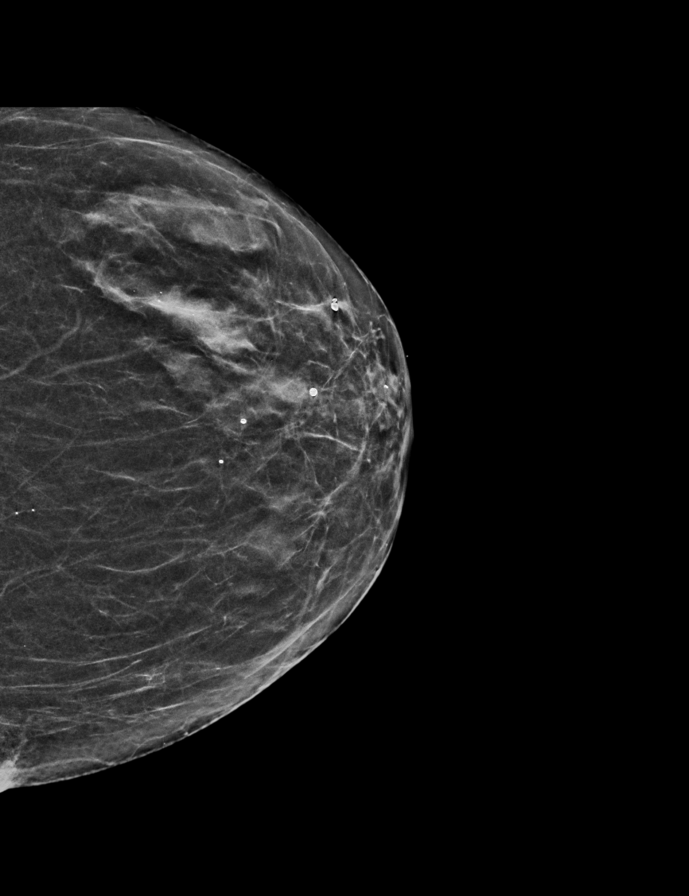

[R MLO synth-2D]
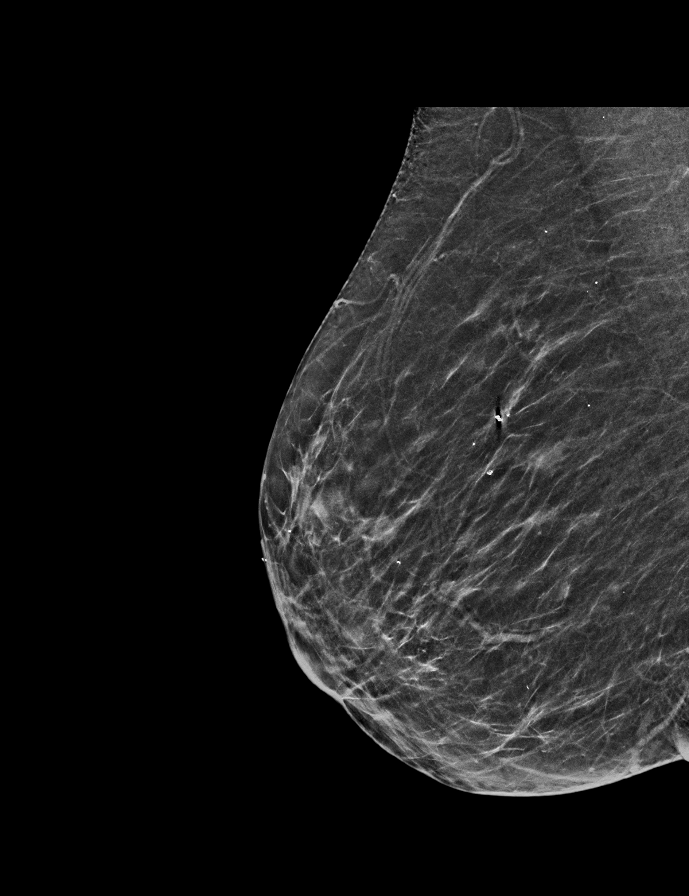

[L MLO synth-2D]
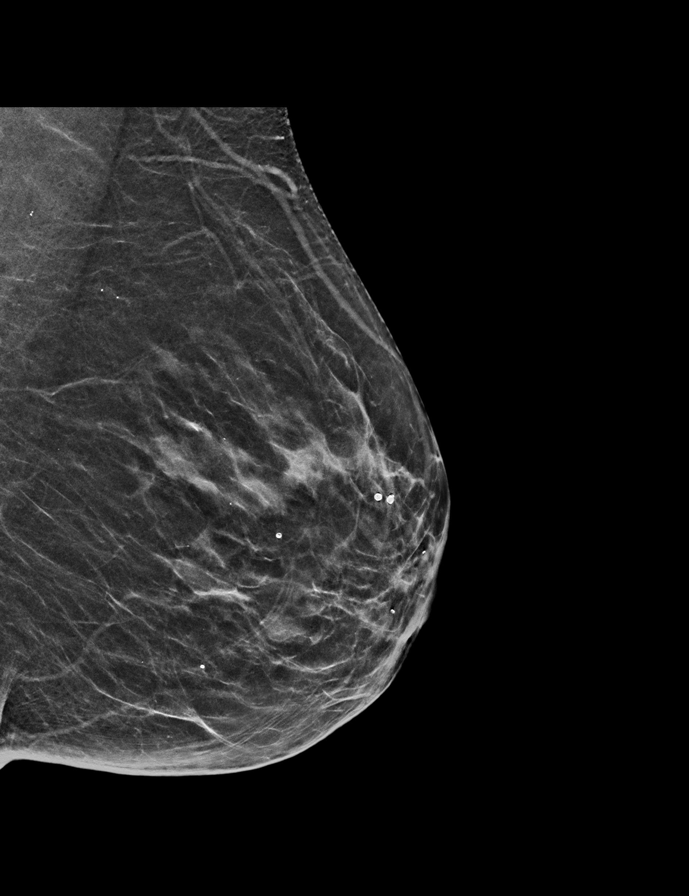

[R CC synth-2D]
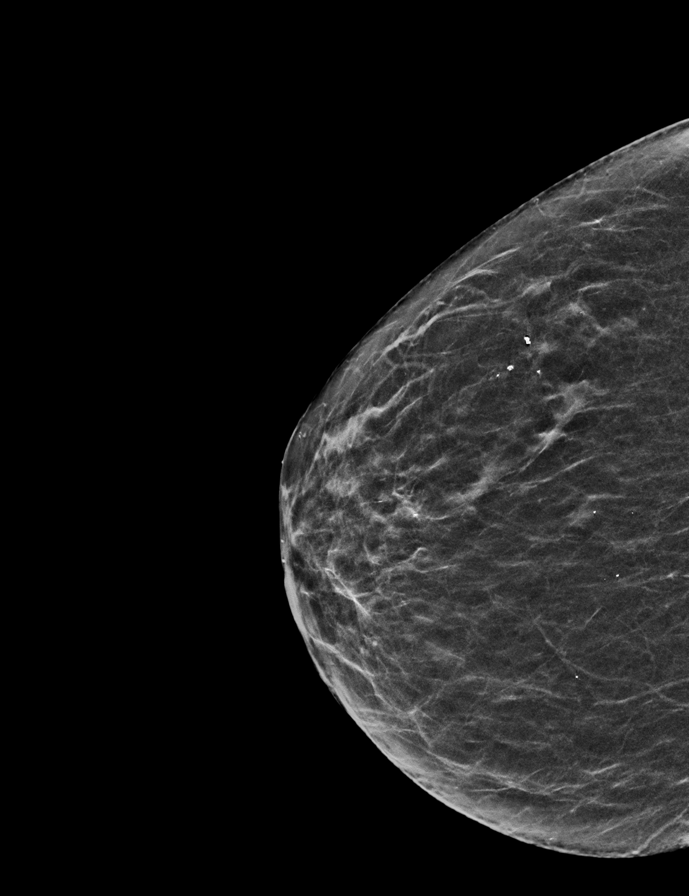

[R MLO tomo · 2 of 48 frames shown]
[frame 16/48]
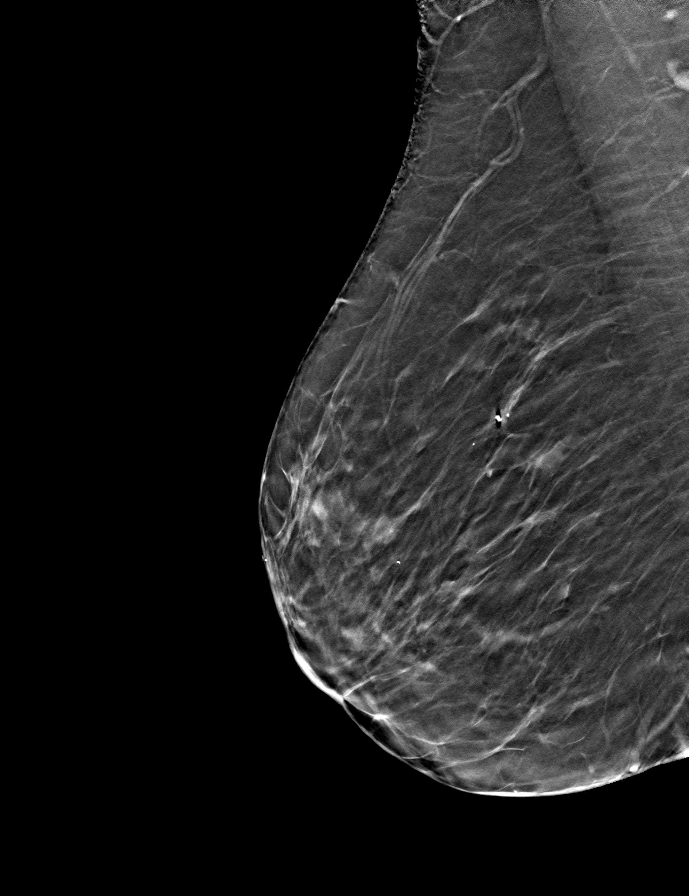
[frame 25/48]
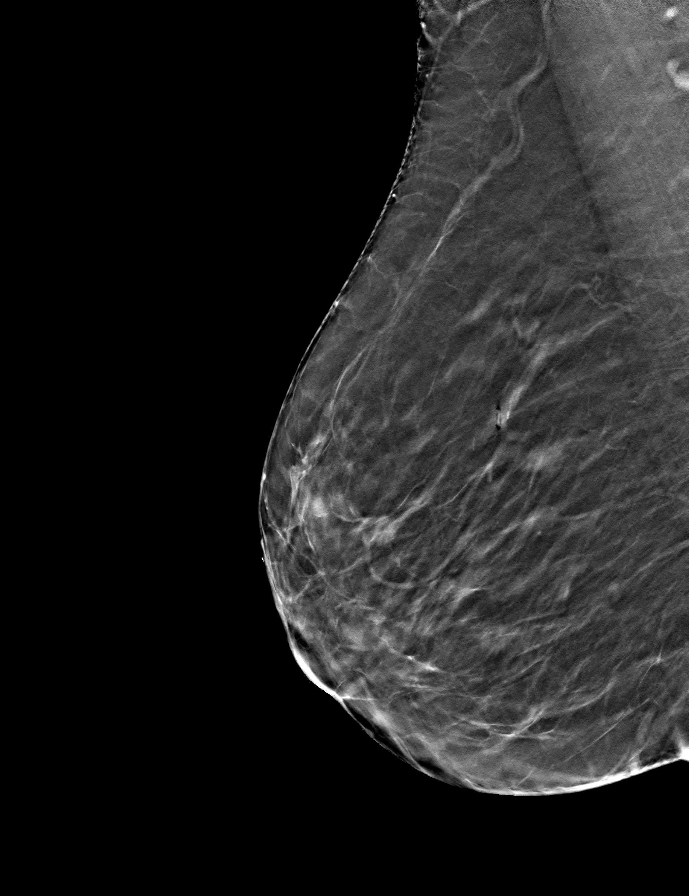

[L MLO tomo · tomo slice 26/51.0]
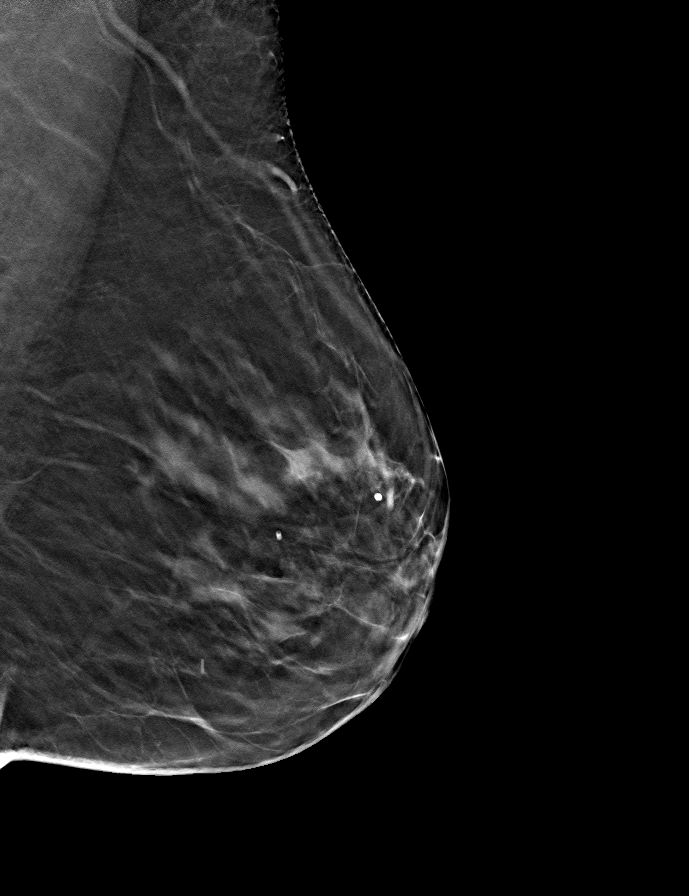

[R CC tomo · tomo slice 25/48.0]
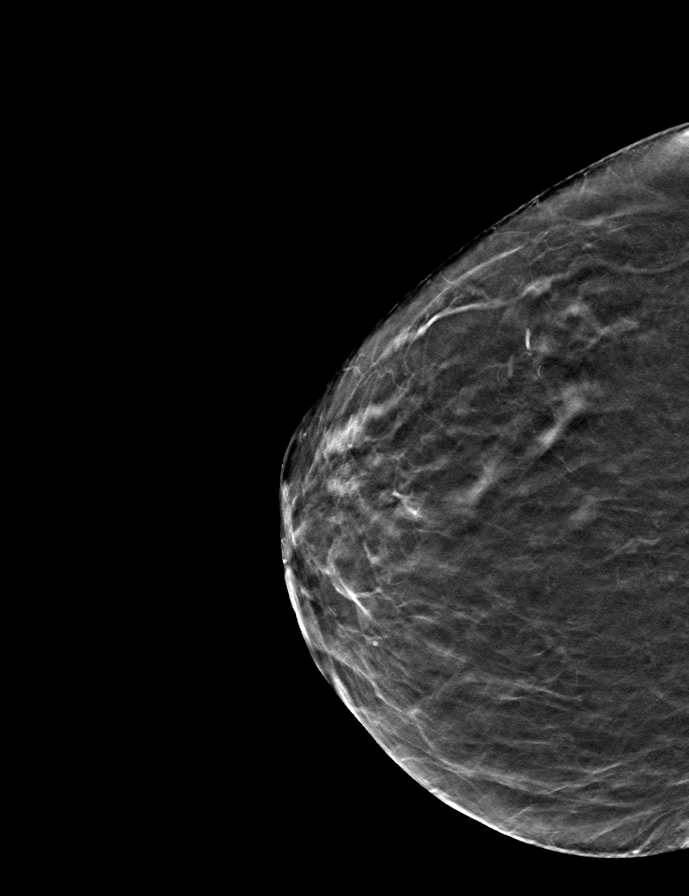

[L CC tomo · tomo slice 23/46.0]
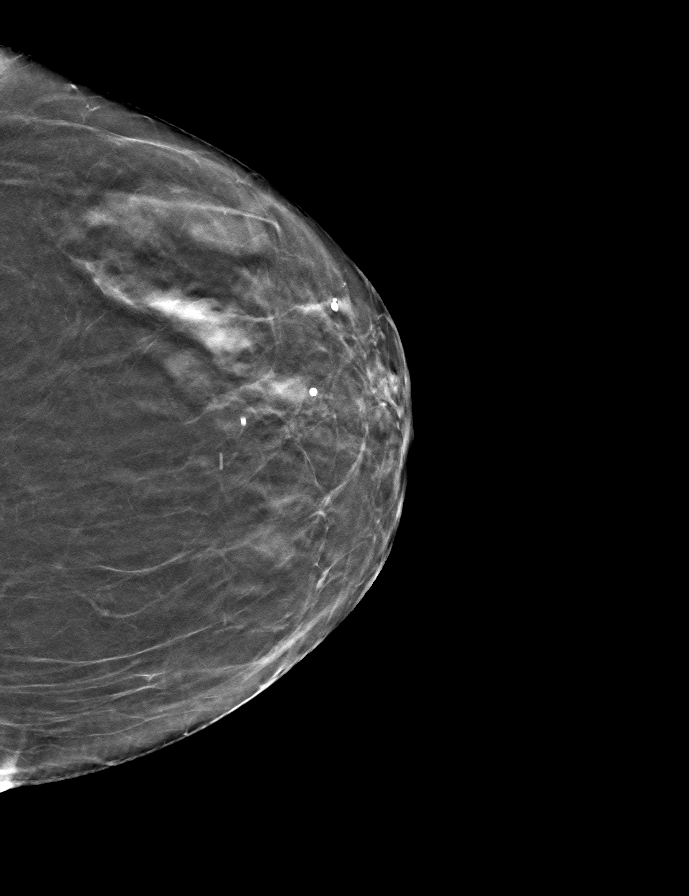

[9 of 24 positions shown; findings below may reference images not displayed]

ACR Breast Density Category b: There are scattered areas of
fibroglandular density.
FINDINGS: There are no findings suspicious for malignancy.
IMPRESSION: No mammographic evidence of malignancy. A result letter of this
screening mammogram will be mailed directly to the patient.

RECOMMENDATION:
Screening mammogram in one year. (Code:51-O-LD2)

BI-RADS CATEGORY  1: Negative.

## 2023-01-01 MED ORDER — SULFAMETHOXAZOLE-TRIMETHOPRIM 800-160 MG PO TABS: 1.0000 | ORAL_TABLET | Freq: Two times a day (BID) | ORAL | 0 refills | Status: AC

## 2023-01-01 NOTE — Progress Notes (Signed)
Virtual Visit via Video Note  I connected with Colleen Acevedo on 01/01/23 at  4:40 PM EST by a video enabled telemedicine application and verified that I am speaking with the correct person using two identifiers.  Patient Location: Home Provider Location: Office/Clinic  I discussed the limitations, risks, security, and privacy concerns of performing an evaluation and management service by video and the availability of in person appointments. I also discussed with the patient that there may be a patient responsible charge related to this service. The patient expressed understanding and agreed to proceed.  Subjective: PCP: Alvira Monday, FNP  Chief Complaint  Patient presents with   Urinary Tract Infection    Pt is having uti for 2-3 weeks, has been using vaginal cream.    HPI Patient is in today with complaints of urinary tract infection for 3 weeks.  She complains of dysuria, urgency, and frequency of urination.  She denies fever and chills and low back pain.  No abdominal pain reported.    ROS: Per HPI  Current Outpatient Medications:    benzonatate (TESSALON) 100 MG capsule, Take 1 capsule (100 mg total) by mouth 2 (two) times daily as needed for cough., Disp: 20 capsule, Rfl: 0   Calcium Carbonate-Vitamin D (CALCIUM 500 + D) 500-125 MG-UNIT TABS, Take by mouth., Disp: , Rfl:    carbidopa-levodopa (SINEMET IR) 25-100 MG tablet, Take 2 tablets by mouth 4 (four) times daily., Disp: 720 tablet, Rfl: 3   CRANBERRY PO, Take by mouth., Disp: , Rfl:    estradiol (ESTRACE) 0.1 MG/GM vaginal cream, Place 1 Applicatorful vaginally. Every other day, Disp: , Rfl:    fesoterodine (TOVIAZ) 8 MG TB24 tablet, TAKE 1 TABLET EVERY DAY, Disp: 90 tablet, Rfl: 0   loratadine (CLARITIN) 10 MG tablet, Take 10 mg by mouth daily., Disp: , Rfl:    metroNIDAZOLE (METROGEL VAGINAL) 0.75 % vaginal gel, Place 1 Applicatorful vaginally at bedtime., Disp: 70 g, Rfl: 0   omeprazole (PRILOSEC) 40 MG  capsule, TAKE 1 CAPSULE EVERY DAY, Disp: 90 capsule, Rfl: 2   OVER THE COUNTER MEDICATION, Velbet El energy antler 250 mg, Disp: , Rfl:    promethazine-dextromethorphan (PROMETHAZINE-DM) 6.25-15 MG/5ML syrup, Take 5 mLs by mouth 4 (four) times daily as needed for cough., Disp: 118 mL, Rfl: 0   sertraline (ZOLOFT) 25 MG tablet, Take 2 tablets (50 mg total) by mouth daily., Disp: 180 tablet, Rfl: 3   simvastatin (ZOCOR) 40 MG tablet, TAKE 1 TABLET EVERY DAY, Disp: 90 tablet, Rfl: 1   sulfamethoxazole-trimethoprim (BACTRIM DS) 800-160 MG tablet, Take 1 tablet by mouth 2 (two) times daily for 6 days., Disp: 12 tablet, Rfl: 0   vitamin B-12 (CYANOCOBALAMIN) 100 MCG tablet, Take 100 mcg by mouth daily., Disp: , Rfl:   Observations/Objective: There were no vitals filed for this visit. Physical Exam Patient is well-developed, well-nourished in no acute distress.  Resting comfortably at home.  Head is normocephalic, atraumatic.  No labored breathing.  Speech is clear and coherent with logical content.  Patient is alert and oriented at baseline.   Assessment and Plan: Urinary frequency -     POCT URINALYSIS DIP (CLINITEK) -     Urine Culture -     Sulfamethoxazole-Trimethoprim; Take 1 tablet by mouth 2 (two) times daily for 6 days.  Dispense: 12 tablet; Refill: 0  Urinary tract infection without hematuria, site unspecified -     Urine Culture -     Sulfamethoxazole-Trimethoprim; Take 1 tablet by  mouth 2 (two) times daily for 6 days.  Dispense: 12 tablet; Refill: 0  Will treat with Bactrim for 6 days Please complete the full course of the antibiotics   You can help prevent UTIs by doing the following:  -Avoid holding urine for prolonged periods; this stretches the bladder and causes bacteria to form because bacteria like warm and wet environments to grow -Empty the bladder as soon as the need arises.  -Empty your bladder soon after intercourse.  -Take showers instead of baths -Wipe front to  back; doing so after urinating and after a bowel movement helps prevent bacteria in the anal region from spreading to the vagina and urethra. -Also, drink a full glass of water to help flush bacteria.   Follow Up Instructions: As needed   I discussed the assessment and treatment plan with the patient. The patient was provided an opportunity to ask questions, and all were answered. The patient agreed with the plan and demonstrated an understanding of the instructions.   The patient was advised to call back or seek an in-person evaluation if the symptoms worsen or if the condition fails to improve as anticipated.  The above assessment and management plan was discussed with the patient. The patient verbalized understanding of and has agreed to the management plan.   Alvira Monday, FNP

## 2023-01-06 LAB — URINE CULTURE

## 2023-01-08 ENCOUNTER — Ambulatory Visit (INDEPENDENT_AMBULATORY_CARE_PROVIDER_SITE_OTHER): Payer: Medicare HMO | Admitting: Family Medicine

## 2023-01-08 ENCOUNTER — Encounter: Payer: Self-pay | Admitting: Family Medicine

## 2023-01-08 VITALS — BP 118/70 | HR 80 | Ht 62.0 in | Wt 127.0 lb

## 2023-01-08 DIAGNOSIS — E7849 Other hyperlipidemia: Secondary | ICD-10-CM

## 2023-01-08 DIAGNOSIS — R7301 Impaired fasting glucose: Secondary | ICD-10-CM

## 2023-01-08 DIAGNOSIS — G8929 Other chronic pain: Secondary | ICD-10-CM | POA: Diagnosis not present

## 2023-01-08 DIAGNOSIS — E038 Other specified hypothyroidism: Secondary | ICD-10-CM

## 2023-01-08 DIAGNOSIS — E559 Vitamin D deficiency, unspecified: Secondary | ICD-10-CM | POA: Diagnosis not present

## 2023-01-08 DIAGNOSIS — M25512 Pain in left shoulder: Secondary | ICD-10-CM | POA: Diagnosis not present

## 2023-01-08 DIAGNOSIS — G5793 Unspecified mononeuropathy of bilateral lower limbs: Secondary | ICD-10-CM

## 2023-01-08 MED ORDER — METHYLPREDNISOLONE 4 MG PO TBPK: ORAL_TABLET | ORAL | 0 refills | Status: DC

## 2023-01-08 NOTE — Assessment & Plan Note (Signed)
Chronic Encouraged to follow up with neurology

## 2023-01-08 NOTE — Progress Notes (Signed)
Established Patient Office Visit  Subjective:  Patient ID: Colleen Acevedo, female    DOB: November 28, 1945  Age: 77 y.o. MRN: [id number]  CC:  Chief Complaint  Patient presents with   Follow-up    3 month f/u. Reports doing better with uti sx, took last of her medication this am. Reports right shoulder pain, has used aspirin and aleve to help with this but not helping much, has been ongoing for about a year now, 01/08/2022.  Also reports tingling in her feet for about 2 years or longer  (01/08/2021).     HPI Colleen Acevedo is a 77 y.o. female with past medical history of GERD, Parkinson disease, overactive bladder, depression, left knee pain, and neuropathy involving both lower extremities presents for f/u of  chronic medical conditions. For the details of today's visit, please refer to the assessment and plan.     Past Medical History:  Diagnosis Date   Anxiety disorder    Mixed hyperlipidemia    Parkinson's disease    Urinary incontinence    uses pessary    Past Surgical History:  Procedure Laterality Date   BACK SURGERY     BREAST BIOPSY Right    KNEE SURGERY     VAGINAL HYSTERECTOMY      Family History  Problem Relation Age of Onset   Colon cancer Father    Diabetes Mother    Heart attack Brother    Colon cancer Sister    Breast cancer Sister    Heart attack Brother    Thyroid disease Daughter     Social History   Socioeconomic History   Marital status: Divorced    Spouse name: Not on file   Number of children: 2   Years of education: Not on file   Highest education level: Not on file  Occupational History    Comment: retired  Tobacco Use   Smoking status: Never   Smokeless tobacco: Never  Vaping Use   Vaping Use: Never used  Substance and Sexual Activity   Alcohol use: Never   Drug use: Never   Sexual activity: Not Currently    Birth control/protection: Surgical    Comment: hyst  Other Topics Concern   Not on file  Social History Narrative    Lives at home alone   Right handed   Caffeine: occasional   Social Determinants of Health   Financial Resource Strain: Medium Risk (12/18/2022)   Overall Financial Resource Strain (CARDIA)    Difficulty of Paying Living Expenses: Somewhat hard  Food Insecurity: No Food Insecurity (12/18/2022)   Hunger Vital Sign    Worried About Running Out of Food in the Last Year: Never true    Ran Out of Food in the Last Year: Never true  Transportation Needs: No Transportation Needs (12/18/2022)   PRAPARE - Hydrologist (Medical): No    Lack of Transportation (Non-Medical): No  Physical Activity: Sufficiently Active (12/18/2022)   Exercise Vital Sign    Days of Exercise per Week: 7 days    Minutes of Exercise per Session: 120 min  Stress: Stress Concern Present (12/18/2022)   [place]    Feeling of Stress : To some extent  Social Connections: Socially Isolated (12/18/2022)   Social Connection and Isolation Panel [NHANES]    Frequency of Communication with Friends and Family: More than three times a week    Frequency of Social Gatherings with Friends and  Family: More than three times a week    Attends Religious Services: Never    Active Member of Clubs or Organizations: No    Attends Archivist Meetings: Never    Marital Status: Divorced  Human resources officer Violence: Not At Risk (12/18/2022)   Humiliation, Afraid, Rape, and Kick questionnaire    Fear of Current or Ex-Partner: No    Emotionally Abused: No    Physically Abused: No    Sexually Abused: No    Outpatient Medications Prior to Visit  Medication Sig Dispense Refill   Calcium Carbonate-Vitamin D (CALCIUM 500 + D) 500-125 MG-UNIT TABS Take by mouth.     carbidopa-levodopa (SINEMET IR) 25-100 MG tablet Take 2 tablets by mouth 4 (four) times daily. 720 tablet 3   CRANBERRY PO Take by mouth.     estradiol (ESTRACE) 0.1 MG/GM vaginal  cream Place 1 Applicatorful vaginally. Every other day     fesoterodine (TOVIAZ) 8 MG TB24 tablet TAKE 1 TABLET EVERY DAY 90 tablet 0   loratadine (CLARITIN) 10 MG tablet Take 10 mg by mouth daily.     metroNIDAZOLE (METROGEL VAGINAL) 0.75 % vaginal gel Place 1 Applicatorful vaginally at bedtime. 70 g 0   omeprazole (PRILOSEC) 40 MG capsule TAKE 1 CAPSULE EVERY DAY 90 capsule 2   OVER THE COUNTER MEDICATION Velbet El energy antler 250 mg     promethazine-dextromethorphan (PROMETHAZINE-DM) 6.25-15 MG/5ML syrup Take 5 mLs by mouth 4 (four) times daily as needed for cough. 118 mL 0   sertraline (ZOLOFT) 25 MG tablet Take 2 tablets (50 mg total) by mouth daily. 180 tablet 3   simvastatin (ZOCOR) 40 MG tablet TAKE 1 TABLET EVERY DAY 90 tablet 1   vitamin B-12 (CYANOCOBALAMIN) 100 MCG tablet Take 100 mcg by mouth daily.     benzonatate (TESSALON) 100 MG capsule Take 1 capsule (100 mg total) by mouth 2 (two) times daily as needed for cough. 20 capsule 0   No facility-administered medications prior to visit.    Allergies  Allergen Reactions   Amoxicillin Itching and Rash    ROS Review of Systems  Constitutional:  Negative for chills and fever.  Eyes:  Negative for visual disturbance.  Respiratory:  Negative for chest tightness and shortness of breath.   Musculoskeletal:  Positive for arthralgias.  Neurological:  Negative for dizziness and headaches.       Tingling sensation of lower extremity      Objective:    Physical Exam HENT:     Head: Normocephalic.     Mouth/Throat:     Mouth: Mucous membranes are moist.  Cardiovascular:     Rate and Rhythm: Normal rate.     Heart sounds: Normal heart sounds.  Pulmonary:     Effort: Pulmonary effort is normal.     Breath sounds: Normal breath sounds.  Musculoskeletal:     Comments: Pain reported with the Apley scratch test and  internal rotation.  Mile pain with palpation of the glenohumeral joint of the right shoulder  Neurological:      Mental Status: She is alert.     BP 118/70   Pulse 80   Ht 5' 2"$  (1.575 m)   Wt 127 lb 0.2 oz (57.6 kg)   SpO2 94%   BMI 23.23 kg/m  Wt Readings from Last 3 Encounters:  01/08/23 127 lb 0.2 oz (57.6 kg)  12/18/22 123 lb (55.8 kg)  12/03/22 135 lb (61.2 kg)    Lab Results  Component  Value Date   TSH 2.540 09/11/2022   Lab Results  Component Value Date   WBC 8.0 09/11/2022   HGB 13.2 09/11/2022   HCT 38.7 09/11/2022   MCV 88 09/11/2022   PLT 227 09/11/2022   Lab Results  Component Value Date   NA 139 09/11/2022   K 4.1 09/11/2022   CO2 24 09/11/2022   GLUCOSE 86 09/11/2022   BUN 24 09/11/2022   CREATININE 0.64 09/11/2022   BILITOT 0.6 09/11/2022   ALKPHOS 90 09/11/2022   AST 13 09/11/2022   ALT 3 09/11/2022   PROT 6.2 09/11/2022   ALBUMIN 4.1 09/11/2022   CALCIUM 9.3 09/11/2022   EGFR 90 09/11/2022   Lab Results  Component Value Date   CHOL 130 09/11/2022   Lab Results  Component Value Date   HDL 49 09/11/2022   Lab Results  Component Value Date   LDLCALC 68 09/11/2022   Lab Results  Component Value Date   TRIG 58 09/11/2022   Lab Results  Component Value Date   CHOLHDL 2.7 09/11/2022   Lab Results  Component Value Date   HGBA1C 5.2 09/11/2022      Assessment & Plan:  Chronic left shoulder pain Assessment & Plan: History of chronic left shoulder pain for 2 years Recent flareup 2 weeks ago Reports difficulty fastening her bra and putting on her close Complains of pain when laying on the affected side and at nighttime Described pain as a dull and achy sensation Pain is sometimes sharp Pain is rated 8-9 out of 10 She rates pain today 1 out of 10 We will treat for adhesive capsulitis with Medrol Dosepak Encouraged to continue taking Tylenol as needed for pain I recommend alternating with heat and cold therapy at the affected Encouraged to follow-up with orthopedics for worsening of symptoms  Orders: -     methylPREDNISolone; Take as  the package instructed  Dispense: 1 each; Refill: 0  Other hyperlipidemia Assessment & Plan: She takes simvastatin 40 mg daily Complaints voiced Will assess lipid panel today Lab Results  Component Value Date   CHOL 130 09/11/2022   HDL 49 09/11/2022   LDLCALC 68 09/11/2022   TRIG 58 09/11/2022   CHOLHDL 2.7 09/11/2022     Orders: -     Lipid panel -     CMP14+EGFR -     CBC with Differential/Platelet  Neuropathy involving both lower extremities Assessment & Plan: Chronic Encouraged to follow up with neurology   IFG (impaired fasting glucose) -     Hemoglobin A1c  Vitamin D deficiency -     VITAMIN D 25 Hydroxy (Vit-D Deficiency, Fractures)  Other specified hypothyroidism -     TSH + free T4    Follow-up: Return in about 3 months (around 04/08/2023).   Alvira Monday, FNP

## 2023-01-08 NOTE — Assessment & Plan Note (Addendum)
History of chronic left shoulder pain for 2 years Recent flareup 2 weeks ago Reports difficulty fastening her bra and putting on her close Complains of pain when laying on the affected side and at nighttime Described pain as a dull and achy sensation Pain is sometimes sharp Pain is rated 8-9 out of 10 She rates pain today 1 out of 10 We will treat for adhesive capsulitis with Medrol Dosepak Encouraged to continue taking Tylenol as needed for pain I recommend alternating with heat and cold therapy at the affected Encouraged to follow-up with orthopedics for worsening of symptoms

## 2023-01-08 NOTE — Patient Instructions (Addendum)
I appreciate the opportunity to provide care to you today!    Follow up:  3 months  Labs: please stop by the lab today/ during the week to get your blood drawn (CBC, CMP, TSH, Lipid profile, HgA1c, Vit D)  Adhesive Capsulitis Will be treating today with Medrol Dosepak Please take Tylenol as needed for pain I recommend alternating with heat and cold therapy at the affected site Please follow up with orthopedics for worsening of your symptoms  Neuropathy of the lower extremities Please follow up with neurology for neuropathy of the lower extremity   Please continue to a heart-healthy diet and increase your physical activities. Try to exercise for 10mns at least five times a week.      It was a pleasure to see you and I look forward to continuing to work together on your health and well-being. Please do not hesitate to call the office if you need care or have questions about your care.   Have a wonderful day and week. With Gratitude, GAlvira MondayMSN, FNP-BC

## 2023-01-08 NOTE — Assessment & Plan Note (Signed)
She takes simvastatin 40 mg daily Complaints voiced Will assess lipid panel today Lab Results  Component Value Date   CHOL 130 09/11/2022   HDL 49 09/11/2022   LDLCALC 68 09/11/2022   TRIG 58 09/11/2022   CHOLHDL 2.7 09/11/2022

## 2023-01-15 ENCOUNTER — Telehealth: Payer: Self-pay

## 2023-01-15 DIAGNOSIS — E559 Vitamin D deficiency, unspecified: Secondary | ICD-10-CM | POA: Diagnosis not present

## 2023-01-15 DIAGNOSIS — R7301 Impaired fasting glucose: Secondary | ICD-10-CM | POA: Diagnosis not present

## 2023-01-15 DIAGNOSIS — E7849 Other hyperlipidemia: Secondary | ICD-10-CM | POA: Diagnosis not present

## 2023-01-15 DIAGNOSIS — R319 Hematuria, unspecified: Secondary | ICD-10-CM | POA: Diagnosis not present

## 2023-01-15 DIAGNOSIS — N898 Other specified noninflammatory disorders of vagina: Secondary | ICD-10-CM | POA: Diagnosis not present

## 2023-01-15 DIAGNOSIS — E038 Other specified hypothyroidism: Secondary | ICD-10-CM | POA: Diagnosis not present

## 2023-01-15 NOTE — Telephone Encounter (Signed)
Patient left message wanting to see when her appointment was. She was thinking she had missed it. I called and left her a message to call the office, she does have an appointment on Friday 01/24/23 @ 8:30 with Dr. Amedeo Kinsman.

## 2023-01-16 LAB — LIPID PANEL

## 2023-01-17 LAB — CMP14+EGFR
ALT: 14 IU/L (ref 0–32)
AST: 8 IU/L (ref 0–40)
Albumin/Globulin Ratio: 1.8 (ref 1.2–2.2)
Albumin: 4.3 g/dL (ref 3.8–4.8)
Alkaline Phosphatase: 120 IU/L (ref 44–121)
BUN/Creatinine Ratio: 38 — ABNORMAL HIGH (ref 12–28)
BUN: 24 mg/dL (ref 8–27)
Bilirubin Total: 0.6 mg/dL (ref 0.0–1.2)
CO2: 21 mmol/L (ref 20–29)
Calcium: 9.7 mg/dL (ref 8.7–10.3)
Chloride: 101 mmol/L (ref 96–106)
Creatinine, Ser: 0.64 mg/dL (ref 0.57–1.00)
Globulin, Total: 2.4 g/dL (ref 1.5–4.5)
Glucose: 92 mg/dL (ref 70–99)
Potassium: 4.5 mmol/L (ref 3.5–5.2)
Sodium: 143 mmol/L (ref 134–144)
Total Protein: 6.7 g/dL (ref 6.0–8.5)
eGFR: 90 mL/min/{1.73_m2} (ref >59)

## 2023-01-17 LAB — LIPID PANEL
Chol/HDL Ratio: 2.5 ratio (ref 0.0–4.4)
Cholesterol, Total: 168 mg/dL (ref 100–199)
HDL: 68 mg/dL (ref >39)
LDL Chol Calc (NIH): 82 mg/dL (ref 0–99)
Triglycerides: 99 mg/dL (ref 0–149)
VLDL Cholesterol Cal: 18 mg/dL (ref 5–40)

## 2023-01-17 LAB — CBC WITH DIFFERENTIAL/PLATELET
Basophils Absolute: 0 10*3/uL (ref 0.0–0.2)
Basos: 0 %
EOS (ABSOLUTE): 0.1 10*3/uL (ref 0.0–0.4)
Eos: 1 %
Hematocrit: 44.6 % (ref 34.0–46.6)
Hemoglobin: 15 g/dL (ref 11.1–15.9)
Immature Grans (Abs): 0.1 10*3/uL (ref 0.0–0.1)
Immature Granulocytes: 1 %
Lymphocytes Absolute: 1.5 10*3/uL (ref 0.7–3.1)
Lymphs: 19 %
MCH: 28.5 pg (ref 26.6–33.0)
MCHC: 33.6 g/dL (ref 31.5–35.7)
MCV: 85 fL (ref 79–97)
Monocytes Absolute: 0.6 10*3/uL (ref 0.1–0.9)
Monocytes: 8 %
Neutrophils Absolute: 5.6 10*3/uL (ref 1.4–7.0)
Neutrophils: 71 %
Platelets: 258 10*3/uL (ref 150–450)
RBC: 5.27 x10E6/uL (ref 3.77–5.28)
RDW: 13.2 % (ref 11.7–15.4)
WBC: 8 10*3/uL (ref 3.4–10.8)

## 2023-01-17 LAB — VITAMIN D 25 HYDROXY (VIT D DEFICIENCY, FRACTURES): Vit D, 25-Hydroxy: 48.8 ng/mL (ref 30.0–100.0)

## 2023-01-17 LAB — HEMOGLOBIN A1C
Est. average glucose Bld gHb Est-mCnc: 105 mg/dL
Hgb A1c MFr Bld: 5.3 % (ref 4.8–5.6)

## 2023-01-17 LAB — TSH+FREE T4
Free T4: 1.34 ng/dL (ref 0.82–1.77)
TSH: 4.13 u[IU]/mL (ref 0.450–4.500)

## 2023-01-17 NOTE — Progress Notes (Signed)
Please encouraged the patient to increase her fluid consumption, at least 64 ounces daily.  All other labs are stable

## 2023-01-20 ENCOUNTER — Other Ambulatory Visit: Payer: Self-pay | Admitting: Urology

## 2023-01-20 ENCOUNTER — Telehealth (INDEPENDENT_AMBULATORY_CARE_PROVIDER_SITE_OTHER): Payer: Medicare HMO | Admitting: Family Medicine

## 2023-01-20 DIAGNOSIS — N3941 Urge incontinence: Secondary | ICD-10-CM

## 2023-01-20 DIAGNOSIS — J069 Acute upper respiratory infection, unspecified: Secondary | ICD-10-CM | POA: Diagnosis not present

## 2023-01-20 MED ORDER — AZELASTINE-FLUTICASONE 137-50 MCG/ACT NA SUSP: 1.0000 | Freq: Two times a day (BID) | NASAL | 1 refills | Status: DC

## 2023-01-20 MED ORDER — BENZONATATE 100 MG PO CAPS: 100.0000 mg | ORAL_CAPSULE | Freq: Two times a day (BID) | ORAL | 0 refills | Status: DC | PRN

## 2023-01-20 MED ORDER — GUAIFENESIN 100 MG/5ML PO LIQD: 5.0000 mL | ORAL | 0 refills | Status: DC | PRN

## 2023-01-20 MED ORDER — LEVOCETIRIZINE DIHYDROCHLORIDE 5 MG PO TABS: 5.0000 mg | ORAL_TABLET | Freq: Every evening | ORAL | 1 refills | Status: DC

## 2023-01-20 NOTE — Progress Notes (Signed)
Virtual Visit via Video Note  I connected with Colleen Acevedo on 01/20/23 at  2:00 PM EST by a video enabled telemedicine application and verified that I am speaking with the correct person using two identifiers.  Patient Location: Home Provider Location: Office/Clinic  I discussed the limitations, risks, security, and privacy concerns of performing an evaluation and management service by video and the availability of in person appointments. I also discussed with the patient that there may be a patient responsible charge related to this service. The patient expressed understanding and agreed to proceed.  Subjective: PCP: Alvira Monday, FNP  No chief complaint on file.  HPI Patient describes symptoms of dry cough,sneezing, clear runny nose, headaches and malaise. Symptoms began 2 weeks ago and are unchanged since that time. Patient denies dyspnea, chest pain, or nausea and vomiting. Treatment thus far includes none. Past pulmonary history is not significant for COPD, emphysema, and pneumonia. At home Covid test negative.   ROS: Per HPI  Current Outpatient Medications:    Azelastine-Fluticasone 137-50 MCG/ACT SUSP, Place 1 spray into the nose every 12 (twelve) hours., Disp: 23 g, Rfl: 1   benzonatate (TESSALON) 100 MG capsule, Take 1 capsule (100 mg total) by mouth 2 (two) times daily as needed for cough., Disp: 20 capsule, Rfl: 0   guaiFENesin (ROBITUSSIN) 100 MG/5ML liquid, Take 5 mLs by mouth every 4 (four) hours as needed for cough or to loosen phlegm., Disp: 120 mL, Rfl: 0   levocetirizine (XYZAL) 5 MG tablet, Take 1 tablet (5 mg total) by mouth every evening., Disp: 15 tablet, Rfl: 1   Calcium Carbonate-Vitamin D (CALCIUM 500 + D) 500-125 MG-UNIT TABS, Take by mouth., Disp: , Rfl:    carbidopa-levodopa (SINEMET IR) 25-100 MG tablet, Take 2 tablets by mouth 4 (four) times daily., Disp: 720 tablet, Rfl: 3   CRANBERRY PO, Take by mouth., Disp: , Rfl:    estradiol (ESTRACE) 0.1  MG/GM vaginal cream, Place 1 Applicatorful vaginally. Every other day, Disp: , Rfl:    fesoterodine (TOVIAZ) 8 MG TB24 tablet, TAKE 1 TABLET EVERY DAY, Disp: 90 tablet, Rfl: 0   methylPREDNISolone (MEDROL DOSEPAK) 4 MG TBPK tablet, Take as the package instructed, Disp: 1 each, Rfl: 0   metroNIDAZOLE (METROGEL VAGINAL) 0.75 % vaginal gel, Place 1 Applicatorful vaginally at bedtime., Disp: 70 g, Rfl: 0   omeprazole (PRILOSEC) 40 MG capsule, TAKE 1 CAPSULE EVERY DAY, Disp: 90 capsule, Rfl: 2   OVER THE COUNTER MEDICATION, Velbet El energy antler 250 mg, Disp: , Rfl:    promethazine-dextromethorphan (PROMETHAZINE-DM) 6.25-15 MG/5ML syrup, Take 5 mLs by mouth 4 (four) times daily as needed for cough., Disp: 118 mL, Rfl: 0   sertraline (ZOLOFT) 25 MG tablet, Take 2 tablets (50 mg total) by mouth daily., Disp: 180 tablet, Rfl: 3   simvastatin (ZOCOR) 40 MG tablet, TAKE 1 TABLET EVERY DAY, Disp: 90 tablet, Rfl: 1   vitamin B-12 (CYANOCOBALAMIN) 100 MCG tablet, Take 100 mcg by mouth daily., Disp: , Rfl:   Observations/Objective: There were no vitals filed for this visit. Physical Exam Video Visit: Patient is Alert and orientated x 3   Assessment and Plan: Viral upper respiratory illness -     Levocetirizine Dihydrochloride; Take 1 tablet (5 mg total) by mouth every evening.  Dispense: 15 tablet; Refill: 1 -     Benzonatate; Take 1 capsule (100 mg total) by mouth 2 (two) times daily as needed for cough.  Dispense: 20 capsule; Refill: 0 -  guaiFENesin; Take 5 mLs by mouth every 4 (four) hours as needed for cough or to loosen phlegm.  Dispense: 120 mL; Refill: 0 -     Azelastine-Fluticasone; Place 1 spray into the nose every 12 (twelve) hours.  Dispense: 23 g; Refill: 1    Follow Up Instructions: No follow-ups on file.   I discussed the assessment and treatment plan with the patient. The patient was provided an opportunity to ask questions, and all were answered. The patient agreed with the plan  and demonstrated an understanding of the instructions.   The patient was advised to call back or seek an in-person evaluation if the symptoms worsen or if the condition fails to improve as anticipated.  The above assessment and management plan was discussed with the patient. The patient verbalized understanding of and has agreed to the management plan.   Renard Hamper Ria Comment, FNP

## 2023-01-20 NOTE — Assessment & Plan Note (Addendum)
Explained to patient symptomatic treatment, rest, increase oral fluid intake. Prescribed symptom relief, Azelastine-Fluticasone 137-50 MCG, Benzonatate 100 mg, XYZAL 5 mg. Take OTC tylenol for headaches medications. Follow-up for worsening or persistent symptoms. Patient verbalizes understanding regarding plan of care and all questions answered.

## 2023-01-23 ENCOUNTER — Encounter: Payer: Self-pay | Admitting: Radiology

## 2023-01-24 ENCOUNTER — Encounter: Payer: Medicare HMO | Admitting: Orthopedic Surgery

## 2023-01-29 ENCOUNTER — Ambulatory Visit (INDEPENDENT_AMBULATORY_CARE_PROVIDER_SITE_OTHER): Payer: Medicare HMO

## 2023-01-29 ENCOUNTER — Encounter: Payer: Self-pay | Admitting: Orthopedic Surgery

## 2023-01-29 ENCOUNTER — Ambulatory Visit (INDEPENDENT_AMBULATORY_CARE_PROVIDER_SITE_OTHER): Payer: Medicare HMO | Admitting: Orthopedic Surgery

## 2023-01-29 VITALS — BP 124/63 | HR 79 | Ht 62.0 in | Wt 127.0 lb

## 2023-01-29 DIAGNOSIS — M19011 Primary osteoarthritis, right shoulder: Secondary | ICD-10-CM | POA: Diagnosis not present

## 2023-01-29 DIAGNOSIS — G8929 Other chronic pain: Secondary | ICD-10-CM | POA: Diagnosis not present

## 2023-01-29 DIAGNOSIS — M25511 Pain in right shoulder: Secondary | ICD-10-CM

## 2023-01-29 NOTE — Progress Notes (Signed)
Orthopaedic Clinic Return  Assessment: Colleen Acevedo is a 77 y.o. female with the following: Right glenohumeral arthritis  Plan: Colleen Acevedo has advanced degenerative changes of the right glenohumeral joint.  Radiographs were reviewed with the patient.  All questions were answered.  We briefly discussed the possibility of proceeding with shoulder replacement surgery.  At this time, I am recommending a steroid injection.  She is in agreement this plan.  This was completed in clinic today.  She will return to clinic as needed.  Procedure note injection - Right shoulder, ultrasound guidance   Verbal consent was obtained to inject the Right shoulder, glenohumeral joint  Timeout was completed to confirm the site of injection.   Using the ultrasound, the rotator cuff tendons were identified.  The joint space was also identified. The skin was prepped with alcohol and ethyl chloride was sprayed at the injection site.  A 21-gauge needle was used to inject 40 mg of Depo-Medrol and 1% lidocaine (4 cc) into the glenohumeral joint space of the Right shoulder using a posterolateral approach.  The needle was visualized entering the glenohumeral joint, and the medication was also visualized. There were no complications.  A sterile bandage was applied.   Note: In order to accurately identify the placement of the needle, ultrasound was required, to increase the accuracy, and specificity of the injection.   Follow-up: No follow-ups on file.   Subjective:  Chief Complaint  Patient presents with   Shoulder Pain    Rt shoulder pain on and off for a yr. NKI     History of Present Illness: Colleen Acevedo is a 77 y.o. female who returns to clinic for evaluation of right shoulder pain.  She has had intermittent right shoulder pain for the past year.  It is progressively worsening.  It is limiting her function.  No known injuries.  She saw her primary care doctor recently, and took a short course of  prednisone.  This improved her symptoms, but is since she stopped taking the prednisone, her pain has returned.  Review of Systems: No fevers or chills No numbness or tingling No chest pain No shortness of breath No bowel or bladder dysfunction No GI distress No headaches   Objective: BP 124/63   Pulse 79   Ht '5\' 2"'$  (1.575 m)   Wt 127 lb (57.6 kg)   BMI 23.23 kg/m   Physical Exam:  Alert and oriented.  No acute distress.  Active motion intact in her right hand.  No deformity about the right shoulder.  2+ radial pulse.  Passive forward flexion to 110 degrees before it is uncomfortable.  External rotation is limited compared to the contralateral side.  IMAGING: I personally ordered and reviewed the following images:  X-rays of the right shoulder were obtained in clinic today.  No acute injuries are noted.  Complete loss of glenohumeral joint space, with inferior osteophytes on the humeral head.  No evidence of proximal humeral migration.  Cystic formation around the footprint of the rotator cuff.  Glenohumeral joint is reduced.  No bony lesions.  Impression: Right shoulder x-rays with advanced glenohumeral arthritis   Mordecai Rasmussen, MD 01/29/2023 8:49 AM

## 2023-01-29 NOTE — Patient Instructions (Signed)

## 2023-01-31 ENCOUNTER — Encounter: Payer: Medicare HMO | Admitting: Orthopedic Surgery

## 2023-02-23 ENCOUNTER — Other Ambulatory Visit: Payer: Self-pay | Admitting: Urology

## 2023-02-23 DIAGNOSIS — N3941 Urge incontinence: Secondary | ICD-10-CM

## 2023-02-24 NOTE — Telephone Encounter (Signed)
Left msg for a return call from patient regarding refill request as this is a [place] pt.

## 2023-02-26 ENCOUNTER — Encounter: Payer: Self-pay | Admitting: Family Medicine

## 2023-02-26 ENCOUNTER — Ambulatory Visit (INDEPENDENT_AMBULATORY_CARE_PROVIDER_SITE_OTHER): Payer: Medicare HMO | Admitting: Family Medicine

## 2023-02-26 VITALS — BP 105/64 | HR 82 | Ht 62.0 in | Wt 127.1 lb

## 2023-02-26 DIAGNOSIS — R053 Chronic cough: Secondary | ICD-10-CM

## 2023-02-26 DIAGNOSIS — W57XXXA Bitten or stung by nonvenomous insect and other nonvenomous arthropods, initial encounter: Secondary | ICD-10-CM

## 2023-02-26 DIAGNOSIS — R350 Frequency of micturition: Secondary | ICD-10-CM | POA: Diagnosis not present

## 2023-02-26 DIAGNOSIS — S40861A Insect bite (nonvenomous) of right upper arm, initial encounter: Secondary | ICD-10-CM

## 2023-02-26 DIAGNOSIS — R300 Dysuria: Secondary | ICD-10-CM

## 2023-02-26 LAB — POCT URINALYSIS DIP (CLINITEK)
Bilirubin, UA: NEGATIVE
Glucose, UA: NEGATIVE mg/dL
Ketones, POC UA: NEGATIVE mg/dL
Nitrite, UA: NEGATIVE
POC PROTEIN,UA: NEGATIVE
Spec Grav, UA: 1.025 (ref 1.010–1.025)
Urobilinogen, UA: 0.2 E.U./dL
pH, UA: 6 (ref 5.0–8.0)

## 2023-02-26 MED ORDER — DOXYCYCLINE HYCLATE 100 MG PO TABS: 100.0000 mg | ORAL_TABLET | Freq: Two times a day (BID) | ORAL | 0 refills | Status: AC

## 2023-02-26 MED ORDER — PREDNISONE 5 MG PO TABS
5.0000 mg | ORAL_TABLET | Freq: Two times a day (BID) | ORAL | 0 refills | Status: AC
Start: 2023-02-26 — End: 2023-03-03

## 2023-02-26 NOTE — Patient Instructions (Signed)
F/U with PCP in 4 weeks re evaluate chronic cough  CXR today  New for cough is 5 day course of prednisone, I believe cough is allergy based Please commit to allergy nose spray twice daily as prescribed and the certizine dose can be increased to twice daily IF NEEDED  You appear to have a UTI, you will be contacted with final result  1 week of antibiotic is prescribed for tick bite with cellulitis  Thanks for choosing  Primary [facility], we consider it a privelige to serve you.

## 2023-02-26 NOTE — Progress Notes (Signed)
   Colleen Acevedo     MRN: [id number]      DOB: March 19, 1946   HPI Colleen Acevedo is here with c/o tick bite on 3/29 discovered the following evening when she removed the tick herself.  Area initally rwed swollen and tender, this is 50 % better and never had systemic symtoms, however , still has erythem a of 6 cm approx  rigfht arm medial aspect  used topical antibiotic and benadryl Urinary frequency x 1 week, no dysuria, no blood in urine no flankpain, has pesasary for 6 years and is bothered from time toime with uTI, never pyelonephritis. No flank pain , fever or chills Cough since 2/28/204, using zrytec, tessalon Perles and Robitussin ROS Denies recent fever or chills. Denies sinus pressure, nasal congestion, ear pain or sore throat.  Denies chest pains, palpitations and leg swelling Denies abdominal pain, nausea, vomiting,diarrhea or constipation.   . Denies joint pain, swelling and limitation in mobility. Denies headaches, seizures, numbness, or tingling. Denies depression, anxiety or insomnia.  PE  BP 105/64 (BP Location: Left Arm, Patient Position: Sitting, Cuff Size: Normal)   Pulse 82   Ht  (1.575 m)   Wt 127 lb 1.9 oz (57.7 kg)   SpO2 95%   BMI 23.25 kg/m   Patient alert and oriented and in no cardiopulmonary distress.  HEENT: No facial asymmetry, EOMI,     Neck supple .  Chest: Clear to auscultation bilaterally.  CVS: S1, S2 no murmurs, no S3.Regular rate.  ABD: Soft non tender. No renal angle or suprapubic tenderness  Ext: No edema  MS: Adequate ROM spine, shoulders, hips and knees.  Skin: erythematous lesion annular on arm in area of tick bite  Psych: Good eye contact, normal affect. Memory intact not anxious or depressed appearing.  CNS: CN 2-12 intact, power,  normal throughout.no focal deficits noted.   Assessment & Plan  Chronic cough CXR , likely allergy based , not infectoius based on history and exam, non smoker , no antibiotic prescribed  for cough, short course of predniosne, f/u with PCP  Dysuria Abnormal cCUA, f/u c/s  Tick bite Erythema an puncture site present, no retained tick present, doxycycline for 1week prescribed  Urinary frequency CCUA abnormal and c/s to follow

## 2023-02-27 ENCOUNTER — Other Ambulatory Visit (HOSPITAL_COMMUNITY): Payer: Self-pay | Admitting: Family Medicine

## 2023-02-27 DIAGNOSIS — Z1231 Encounter for screening mammogram for malignant neoplasm of breast: Secondary | ICD-10-CM

## 2023-02-28 ENCOUNTER — Ambulatory Visit (HOSPITAL_COMMUNITY)
Admission: RE | Admit: 2023-02-28 | Discharge: 2023-02-28 | Disposition: A | Discharge status: Home / Self Care | Payer: Medicare HMO | Source: Ambulatory Visit | Attending: Family Medicine | Admitting: Family Medicine

## 2023-02-28 DIAGNOSIS — R059 Cough, unspecified: Secondary | ICD-10-CM | POA: Diagnosis not present

## 2023-02-28 DIAGNOSIS — R053 Chronic cough: Secondary | ICD-10-CM | POA: Insufficient documentation

## 2023-03-01 LAB — URINE CULTURE

## 2023-03-02 DIAGNOSIS — W57XXXA Bitten or stung by nonvenomous insect and other nonvenomous arthropods, initial encounter: Secondary | ICD-10-CM | POA: Insufficient documentation

## 2023-03-02 DIAGNOSIS — R053 Chronic cough: Secondary | ICD-10-CM | POA: Insufficient documentation

## 2023-03-02 DIAGNOSIS — R300 Dysuria: Secondary | ICD-10-CM | POA: Insufficient documentation

## 2023-03-02 DIAGNOSIS — R051 Acute cough: Secondary | ICD-10-CM | POA: Insufficient documentation

## 2023-03-02 NOTE — Assessment & Plan Note (Signed)
CXR , likely allergy based , not infectoius based on history and exam, non smoker , no antibiotic prescribed for cough, short course of predniosne, f/u with PCP

## 2023-03-02 NOTE — Assessment & Plan Note (Signed)
Erythema an puncture site present, no retained tick present, doxycycline for 1week prescribed

## 2023-03-02 NOTE — Assessment & Plan Note (Signed)
Abnormal cCUA, f/u c/s

## 2023-03-02 NOTE — Assessment & Plan Note (Signed)
CCUA abnormal and c/s to follow

## 2023-03-03 ENCOUNTER — Telehealth: Payer: Self-pay | Admitting: Family Medicine

## 2023-03-03 NOTE — Telephone Encounter (Signed)
Spoke with patient.

## 2023-03-03 NOTE — Telephone Encounter (Signed)
Patient returning image results. Call back# [phone].

## 2023-03-12 ENCOUNTER — Ambulatory Visit (HOSPITAL_COMMUNITY)
Admission: RE | Admit: 2023-03-12 | Discharge: 2023-03-12 | Disposition: A | Discharge status: Home / Self Care | Payer: Medicare HMO | Source: Ambulatory Visit | Attending: Family Medicine | Admitting: Family Medicine

## 2023-03-12 DIAGNOSIS — R6889 Other general symptoms and signs: Secondary | ICD-10-CM | POA: Diagnosis not present

## 2023-03-12 DIAGNOSIS — Z1231 Encounter for screening mammogram for malignant neoplasm of breast: Secondary | ICD-10-CM | POA: Diagnosis not present

## 2023-03-13 NOTE — Telephone Encounter (Signed)
Patient did not want a refill, she did not want to schedule a follow up at this time.  Patient states she will follow up with her PCP regarding her issue.

## 2023-03-13 NOTE — Telephone Encounter (Signed)
Left message to return call to office to schedule with urologist to continue medication refills.

## 2023-04-04 ENCOUNTER — Telehealth: Payer: Self-pay | Admitting: Family Medicine

## 2023-04-04 NOTE — Telephone Encounter (Signed)
Pt is confused about what type of injection she needs.

## 2023-04-07 NOTE — Telephone Encounter (Signed)
Called pt back unable to speak to her.

## 2023-04-09 ENCOUNTER — Ambulatory Visit (INDEPENDENT_AMBULATORY_CARE_PROVIDER_SITE_OTHER): Payer: Medicare HMO | Admitting: Family Medicine

## 2023-04-09 ENCOUNTER — Encounter: Payer: Self-pay | Admitting: Family Medicine

## 2023-04-09 VITALS — BP 100/67 | HR 80 | Ht 62.0 in | Wt 128.1 lb

## 2023-04-09 DIAGNOSIS — N898 Other specified noninflammatory disorders of vagina: Secondary | ICD-10-CM

## 2023-04-09 DIAGNOSIS — E038 Other specified hypothyroidism: Secondary | ICD-10-CM

## 2023-04-09 DIAGNOSIS — K219 Gastro-esophageal reflux disease without esophagitis: Secondary | ICD-10-CM

## 2023-04-09 DIAGNOSIS — E559 Vitamin D deficiency, unspecified: Secondary | ICD-10-CM | POA: Diagnosis not present

## 2023-04-09 DIAGNOSIS — R7301 Impaired fasting glucose: Secondary | ICD-10-CM

## 2023-04-09 DIAGNOSIS — E7849 Other hyperlipidemia: Secondary | ICD-10-CM | POA: Diagnosis not present

## 2023-04-09 DIAGNOSIS — N760 Acute vaginitis: Secondary | ICD-10-CM | POA: Diagnosis not present

## 2023-04-09 DIAGNOSIS — R6889 Other general symptoms and signs: Secondary | ICD-10-CM | POA: Diagnosis not present

## 2023-04-09 NOTE — Assessment & Plan Note (Signed)
Stable on simvastatin 40 mg daily Pending lipid panel Lab Results  Component Value Date   CHOL 168 01/15/2023   HDL 68 01/15/2023   LDLCALC 82 01/15/2023   TRIG 99 01/15/2023   CHOLHDL 2.5 01/15/2023

## 2023-04-09 NOTE — Assessment & Plan Note (Addendum)
Reports increased vaginal discharge She reports having pessary device for urinary incontinence Onset of symptoms 1 to 2 weeks ago Discharge is brown in color and copious Consistency of the discharge is a quite No burning or vulvar irritation reported No systematic symptoms reported The patient has not sexually active Pending NuSwab

## 2023-04-09 NOTE — Progress Notes (Signed)
Established Patient Office Visit  Subjective:  Patient ID: Colleen Acevedo, female    DOB: [birth year]  Age: 77 y.o. MRN: [id number]  CC:  Chief Complaint  Patient presents with   Chronic Care Management    3 month f/u    HPI Colleen Acevedo is a 77 y.o. female with past medical history of GERD, hyperlipidemia, depression presents for f/u of  chronic medical conditions. For the details of today's visit, please refer to the assessment and plan.     Past Medical History:  Diagnosis Date   Anxiety disorder    Mixed hyperlipidemia    Parkinson's disease    Urinary incontinence    uses pessary    Past Surgical History:  Procedure Laterality Date   BACK SURGERY     BREAST BIOPSY Right    KNEE SURGERY     VAGINAL HYSTERECTOMY      Family History  Problem Relation Age of Onset   Colon cancer Father    Diabetes Mother    Heart attack Brother    Colon cancer Sister    Breast cancer Sister    Heart attack Brother    Thyroid disease Daughter     Social History   Socioeconomic History   Marital status: Divorced    Spouse name: Not on file   Number of children: 2   Years of education: Not on file   Highest education level: Not on file  Occupational History    Comment: retired  Tobacco Use   Smoking status: Never   Smokeless tobacco: Never  Vaping Use   Vaping Use: Never used  Substance and Sexual Activity   Alcohol use: Never   Drug use: Never   Sexual activity: Not Currently    Birth control/protection: Surgical    Comment: hyst  Other Topics Concern   Not on file  Social History Narrative   Lives at home alone   Right handed   Caffeine: occasional   Social Determinants of Health   Financial Resource Strain: Medium Risk (12/18/2022)   Overall Financial Resource Strain (CARDIA)    Difficulty of Paying Living Expenses: Somewhat hard  Food Insecurity: No Food Insecurity (12/18/2022)   Hunger Vital Sign    Worried About Running Out of Food in the Last  Year: Never true    Ran Out of Food in the Last Year: Never true  Transportation Needs: No Transportation Needs (12/18/2022)   PRAPARE - Administrator, Civil Service (Medical): No    Lack of Transportation (Non-Medical): No  Physical Activity: Sufficiently Active (12/18/2022)   Exercise Vital Sign    Days of Exercise per Week: 7 days    Minutes of Exercise per Session: 120 min  Stress: Stress Concern Present (12/18/2022)   [employer] of Occupational Health - Occupational Stress Questionnaire    Feeling of Stress : To some extent  Social Connections: Socially Isolated (12/18/2022)   Social Connection and Isolation Panel [NHANES]    Frequency of Communication with Friends and Family: More than three times a week    Frequency of Social Gatherings with Friends and Family: More than three times a week    Attends Religious Services: Never    Database administrator or Organizations: No    Attends Banker Meetings: Never    Marital Status: Divorced  Catering manager Violence: Not At Risk (12/18/2022)   Humiliation, Afraid, Rape, and Kick questionnaire    Fear of Current or Ex-Partner: No  Emotionally Abused: No    Physically Abused: No    Sexually Abused: No    Outpatient Medications Prior to Visit  Medication Sig Dispense Refill   Calcium Carbonate-Vitamin D (CALCIUM 500 + D) 500-125 MG-UNIT TABS Take by mouth.     carbidopa-levodopa (SINEMET IR) 25-100 MG tablet Take 2 tablets by mouth 4 (four) times daily. 720 tablet 3   CRANBERRY PO Take by mouth.     estradiol (ESTRACE) 0.1 MG/GM vaginal cream Place 1 Applicatorful vaginally. Every other day     fesoterodine (TOVIAZ) 8 MG TB24 tablet TAKE 1 TABLET EVERY DAY 90 tablet 0   guaiFENesin (ROBITUSSIN) 100 MG/5ML liquid Take 5 mLs by mouth every 4 (four) hours as needed for cough or to loosen phlegm. 120 mL 0   Lactobacillus (ACIDOPHILUS) 100 MG CAPS Take by mouth.     levocetirizine (XYZAL) 5 MG tablet Take 1  tablet (5 mg total) by mouth every evening. 15 tablet 1   metroNIDAZOLE (METROGEL VAGINAL) 0.75 % vaginal gel Place 1 Applicatorful vaginally at bedtime. 70 g 0   Omega-3 Fatty Acids (FISH OIL) 500 MG CAPS Take by mouth.     omeprazole (PRILOSEC) 40 MG capsule TAKE 1 CAPSULE EVERY DAY 90 capsule 2   OVER THE COUNTER MEDICATION Velbet El energy antler 250 mg     Probiotic Product (PROBIOTIC ADVANCED PO) Take by mouth.     sertraline (ZOLOFT) 25 MG tablet Take 2 tablets (50 mg total) by mouth daily. 180 tablet 3   simvastatin (ZOCOR) 40 MG tablet TAKE 1 TABLET EVERY DAY 90 tablet 1   vitamin B-12 (CYANOCOBALAMIN) 100 MCG tablet Take 100 mcg by mouth daily.     Azelastine-Fluticasone 137-50 MCG/ACT SUSP Place 1 spray into the nose every 12 (twelve) hours. 23 g 1   benzonatate (TESSALON) 100 MG capsule Take 1 capsule (100 mg total) by mouth 2 (two) times daily as needed for cough. 20 capsule 0   promethazine-dextromethorphan (PROMETHAZINE-DM) 6.25-15 MG/5ML syrup Take 5 mLs by mouth 4 (four) times daily as needed for cough. 118 mL 0   No facility-administered medications prior to visit.    Allergies  Allergen Reactions   Amoxicillin Itching and Rash    ROS Review of Systems  Constitutional:  Negative for chills, fatigue and fever.  Eyes:  Negative for visual disturbance.  Respiratory:  Negative for chest tightness and shortness of breath.   Genitourinary:  Positive for vaginal discharge. Negative for difficulty urinating, dyspareunia, dysuria, flank pain, frequency, hematuria, pelvic pain, urgency, vaginal bleeding and vaginal pain.  Neurological:  Negative for dizziness and headaches.      Objective:    Physical Exam HENT:     Head: Normocephalic.     Mouth/Throat:     Mouth: Mucous membranes are moist.  Cardiovascular:     Rate and Rhythm: Normal rate.     Heart sounds: Normal heart sounds.  Pulmonary:     Effort: Pulmonary effort is normal.     Breath sounds: Normal breath  sounds.  Neurological:     Mental Status: She is alert.     BP 100/67   Pulse 80   Ht 5\' 2"  (1.575 m)   Wt 128 lb 1.3 oz (58.1 kg)   SpO2 97%   BMI 23.43 kg/m  Wt Readings from Last 3 Encounters:  04/09/23 128 lb 1.3 oz (58.1 kg)  02/26/23 127 lb 1.9 oz (57.7 kg)  01/29/23 127 lb (57.6 kg)    Lab  Results  Component Value Date   TSH 4.130 01/15/2023   Lab Results  Component Value Date   WBC 8.0 01/15/2023   HGB 15.0 01/15/2023   HCT 44.6 01/15/2023   MCV 85 01/15/2023   PLT 258 01/15/2023   Lab Results  Component Value Date   NA 143 01/15/2023   K 4.5 01/15/2023   CO2 21 01/15/2023   GLUCOSE 92 01/15/2023   BUN 24 01/15/2023   CREATININE 0.64 01/15/2023   BILITOT 0.6 01/15/2023   ALKPHOS 120 01/15/2023   AST 8 01/15/2023   ALT 14 01/15/2023   PROT 6.7 01/15/2023   ALBUMIN 4.3 01/15/2023   CALCIUM 9.7 01/15/2023   EGFR 90 01/15/2023   Lab Results  Component Value Date   CHOL 168 01/15/2023   Lab Results  Component Value Date   HDL 68 01/15/2023   Lab Results  Component Value Date   LDLCALC 82 01/15/2023   Lab Results  Component Value Date   TRIG 99 01/15/2023   Lab Results  Component Value Date   CHOLHDL 2.5 01/15/2023   Lab Results  Component Value Date   HGBA1C 5.3 01/15/2023      Assessment & Plan:  Vaginal discharge Assessment & Plan: Reports increased vaginal discharge She reports having pessary device for urinary incontinence Onset of symptoms 1 to 2 weeks ago Discharge is brown in color and copious Consistency of the discharge is a quite No burning or vulvar irritation reported No systematic symptoms reported The patient has not sexually active Pending NuSwab  Orders: -     NuSwab Vaginitis Plus (VG+)  Other hyperlipidemia Assessment & Plan: Stable on simvastatin 40 mg daily Pending lipid panel Lab Results  Component Value Date   CHOL 168 01/15/2023   HDL 68 01/15/2023   LDLCALC 82 01/15/2023   TRIG 99 01/15/2023    CHOLHDL 2.5 01/15/2023     Orders: -     Lipid panel -     CMP14+EGFR -     CBC with Differential/Platelet  Gastroesophageal reflux disease without esophagitis Assessment & Plan: Stable on omeprazole 40 mg daily No symptoms of heartburn indigestion or cough reported   IFG (impaired fasting glucose) -     Hemoglobin A1c  Vitamin D deficiency -     VITAMIN D 25 Hydroxy (Vit-D Deficiency, Fractures)  Other specified hypothyroidism -     TSH + free T4    Follow-up: Return in about 3 months (around 07/10/2023).   Gilmore Laroche, FNP

## 2023-04-09 NOTE — Assessment & Plan Note (Signed)
Stable on omeprazole 40 mg daily No symptoms of heartburn indigestion or cough reported

## 2023-04-09 NOTE — Patient Instructions (Signed)
I appreciate the opportunity to provide care to you today!    Follow up:  3 months  Labs: please stop by the lab 2-3 days before your next appointment to get your blood drawn (CBC, CMP, TSH, Lipid profile, HgA1c, Vit D)   We will let you know of your nuswab results   Please continue to a heart-healthy diet and increase your physical activities. Try to exercise for at least five days a week.      It was a pleasure to see you and I look forward to continuing to work together on your health and well-being. Please do not hesitate to call the office if you need care or have questions about your care.   Have a wonderful day and week. With Gratitude, Gilmore Laroche MSN, FNP-BC

## 2023-04-12 LAB — NUSWAB VAGINITIS PLUS (VG+)

## 2023-04-13 LAB — NUSWAB VAGINITIS PLUS (VG+)
Candida glabrata, NAA: NEGATIVE
Chlamydia trachomatis, NAA: NEGATIVE
Neisseria gonorrhoeae, NAA: NEGATIVE
Trich vag by NAA: NEGATIVE

## 2023-04-14 NOTE — Progress Notes (Signed)
Please inform the patient that her Nuswab was negative for BV and yeast. Kindly notify the patient that treatment is not warranted at the moment.

## 2023-05-06 NOTE — Patient Instructions (Signed)

## 2023-05-06 NOTE — Progress Notes (Unsigned)
No chief complaint on file.   HISTORY OF PRESENT ILLNESS:  05/06/23 ALL:  Colleen Acevedo is a 77 y.o. female here today for follow up for PD. She was last seen by Colleen Acevedo 09/2022 and doing fairly well. She continues generic Sinemet 2 tablets QID.    HISTORY (copied from Colleen Acevedo previous note)  Colleen Acevedo is a 77 year old right-handed woman with an underlying medical history of hyperlipidemia, recurrent UTIs, left knee arthritis, and anxiety, who presents for follow-up consultation of her Parkinson's disease.  The patient is accompanied by her son-in-law, Colleen Acevedo, today. I last saw her on 04/24/2022, at which time she reported feeling fairly stable, as of March 2023 she had been divorced, as of mid May 2023 she was able to move back into her home.  Her husband got the car.  She saw orthopedics on 11/14/2021 for left knee arthritis.  She was encouraged to wean off of Lyrica.  She received a steroid injection into the left knee.  In mid February 2023 she sustained a nondisplaced medial ankle fracture and saw orthopedics again.  She was treated with a walking boot.     Today, 10/24/22: She reports feeling stable, overall doing well, appetite okay, she drinks Premier protein, 2 bottles daily but not a whole lot of water, she drinks about 2 bottles of water per day, 16.9 ounce size.  She has not fallen, constipation is not an issue lately.  She takes her levodopa on a regular basis, 2 pills 4 times a day at 8, 12, 4 PM and 8 PM daily.  She denies any side effects.  She helps take care of 4 of her great-grandchildren by staying at their homes respectively several times a week, these are her Daughter's grandchildren. She does not drive.   REVIEW OF SYSTEMS: Out of a complete 14 system review of symptoms, the patient complains only of the following symptoms, and all other reviewed systems are negative.   ALLERGIES: Allergies  Allergen Reactions   Amoxicillin Itching and Rash     HOME  MEDICATIONS: Outpatient Medications Prior to Visit  Medication Sig Dispense Refill   Calcium Carbonate-Vitamin D (CALCIUM 500 + D) 500-125 MG-UNIT TABS Take by mouth.     carbidopa-levodopa (SINEMET IR) 25-100 MG tablet Take 2 tablets by mouth 4 (four) times daily. 720 tablet 3   CRANBERRY PO Take by mouth.     estradiol (ESTRACE) 0.1 MG/GM vaginal cream Place 1 Applicatorful vaginally. Every other day     fesoterodine (TOVIAZ) 8 MG TB24 tablet TAKE 1 TABLET EVERY DAY 90 tablet 0   guaiFENesin (ROBITUSSIN) 100 MG/5ML liquid Take 5 mLs by mouth every 4 (four) hours as needed for cough or to loosen phlegm. 120 mL 0   Lactobacillus (ACIDOPHILUS) 100 MG CAPS Take by mouth.     levocetirizine (XYZAL) 5 MG tablet Take 1 tablet (5 mg total) by mouth every evening. 15 tablet 1   metroNIDAZOLE (METROGEL VAGINAL) 0.75 % vaginal gel Place 1 Applicatorful vaginally at bedtime. 70 g 0   Omega-3 Fatty Acids (FISH OIL) 500 MG CAPS Take by mouth.     omeprazole (PRILOSEC) 40 MG capsule TAKE 1 CAPSULE EVERY DAY 90 capsule 2   OVER THE COUNTER MEDICATION Velbet El energy antler 250 mg     Probiotic Product (PROBIOTIC ADVANCED PO) Take by mouth.     sertraline (ZOLOFT) 25 MG tablet Take 2 tablets (50 mg total) by mouth daily. 180 tablet 3   simvastatin (  ZOCOR) 40 MG tablet TAKE 1 TABLET EVERY DAY 90 tablet 1   vitamin B-12 (CYANOCOBALAMIN) 100 MCG tablet Take 100 mcg by mouth daily.     No facility-administered medications prior to visit.     PAST MEDICAL HISTORY: Past Medical History:  Diagnosis Date   Anxiety disorder    Mixed hyperlipidemia    Parkinson's disease    Urinary incontinence    uses pessary     PAST SURGICAL HISTORY: Past Surgical History:  Procedure Laterality Date   BACK SURGERY     BREAST BIOPSY Right    KNEE SURGERY     VAGINAL HYSTERECTOMY       FAMILY HISTORY: Family History  Problem Relation Age of Onset   Colon cancer Father    Diabetes Mother    Heart attack  Brother    Colon cancer Sister    Breast cancer Sister    Heart attack Brother    Thyroid disease Daughter      SOCIAL HISTORY: Social History   Socioeconomic History   Marital status: Divorced    Spouse name: Not on file   Number of children: 2   Years of education: Not on file   Highest education level: Not on file  Occupational History    Comment: retired  Tobacco Use   Smoking status: Never   Smokeless tobacco: Never  Vaping Use   Vaping Use: Never used  Substance and Sexual Activity   Alcohol use: Never   Drug use: Never   Sexual activity: Not Currently    Birth control/protection: Surgical    Comment: hyst  Other Topics Concern   Not on file  Social History Narrative   Lives at home alone   Right handed   Caffeine: occasional   Social Determinants of Health   Financial Resource Strain: Medium Risk (12/18/2022)   Overall Financial Resource Strain (CARDIA)    Difficulty of Paying Living Expenses: Somewhat hard  Food Insecurity: No Food Insecurity (12/18/2022)   Hunger Vital Sign    Worried About Running Out of Food in the Last Year: Never true    Ran Out of Food in the Last Year: Never true  Transportation Needs: No Transportation Needs (12/18/2022)   PRAPARE - Administrator, Civil Service (Medical): No    Lack of Transportation (Non-Medical): No  Physical Activity: Sufficiently Active (12/18/2022)   Exercise Vital Sign    Days of Exercise per Week: 7 days    Minutes of Exercise per Session: 120 min  Stress: Stress Concern Present (12/18/2022)   [employer] of Occupational Health - Occupational Stress Questionnaire    Feeling of Stress : To some extent  Social Connections: Socially Isolated (12/18/2022)   Social Connection and Isolation Panel [NHANES]    Frequency of Communication with Friends and Family: More than three times a week    Frequency of Social Gatherings with Friends and Family: More than three times a week    Attends Religious  Services: Never    Database administrator or Organizations: No    Attends Banker Meetings: Never    Marital Status: Divorced  Catering manager Violence: Not At Risk (12/18/2022)   Humiliation, Afraid, Rape, and Kick questionnaire    Fear of Current or Ex-Partner: No    Emotionally Abused: No    Physically Abused: No    Sexually Abused: No     PHYSICAL EXAM  There were no vitals filed for this visit. There is  no height or weight on file to calculate BMI.  Generalized: Well developed, in no acute distress  Cardiology: normal rate and rhythm, no murmur auscultated  Respiratory: clear to auscultation bilaterally    Neurological examination  Mentation: Alert oriented to time, place, history taking. Follows all commands speech and language fluent Cranial nerve II-XII: Pupils were equal round reactive to light. Extraocular movements were full, visual field were full on confrontational test. Facial sensation and strength were normal. Uvula tongue midline. Head turning and shoulder shrug  were normal and symmetric. Motor: The motor testing reveals 5 over 5 strength of all 4 extremities. Good symmetric motor tone is noted throughout.  Sensory: Sensory testing is intact to soft touch on all 4 extremities. No evidence of extinction is noted.  Coordination: Cerebellar testing reveals good finger-nose-finger and heel-to-shin bilaterally.  Gait and station: Gait is normal. Tandem gait is normal. Romberg is negative. No drift is seen.  Reflexes: Deep tendon reflexes are symmetric and normal bilaterally.    DIAGNOSTIC DATA (LABS, IMAGING, TESTING) - I reviewed patient records, labs, notes, testing and imaging myself where available.  Lab Results  Component Value Date   WBC 8.0 01/15/2023   HGB 15.0 01/15/2023   HCT 44.6 01/15/2023   MCV 85 01/15/2023   PLT 258 01/15/2023      Component Value Date/Time   NA 143 01/15/2023 0853   K 4.5 01/15/2023 0853   CL 101 01/15/2023  0853   CO2 21 01/15/2023 0853   GLUCOSE 92 01/15/2023 0853   BUN 24 01/15/2023 0853   CREATININE 0.64 01/15/2023 0853   CALCIUM 9.7 01/15/2023 0853   PROT 6.7 01/15/2023 0853   ALBUMIN 4.3 01/15/2023 0853   AST 8 01/15/2023 0853   ALT 14 01/15/2023 0853   ALKPHOS 120 01/15/2023 0853   BILITOT 0.6 01/15/2023 0853   GFRNONAA 70 12/07/2020 0812   GFRAA 80 12/07/2020 0812   Lab Results  Component Value Date   CHOL 168 01/15/2023   HDL 68 01/15/2023   LDLCALC 82 01/15/2023   TRIG 99 01/15/2023   CHOLHDL 2.5 01/15/2023   Lab Results  Component Value Date   HGBA1C 5.3 01/15/2023   Lab Results  Component Value Date   VITAMINB12 1,973 (H) 11/09/2021   Lab Results  Component Value Date   TSH 4.130 01/15/2023        No data to display               No data to display           ASSESSMENT AND PLAN  77 y.o. year old female  has a past medical history of Anxiety disorder, Mixed hyperlipidemia, Parkinson's disease, and Urinary incontinence. here with    No diagnosis found.  [name] ***.  Healthy lifestyle habits encouraged. *** will follow up with PCP as directed. *** will return to see me in ***, sooner if needed. *** verbalizes understanding and agreement with this plan.   No orders of the defined types were placed in this encounter.    No orders of the defined types were placed in this encounter.    Shawnie Dapper, MSN, FNP-C 05/06/2023, 3:43 PM  [facility] [address] [place], [place] [address] [phone]

## 2023-05-07 ENCOUNTER — Encounter: Payer: Self-pay | Admitting: Family Medicine

## 2023-05-07 ENCOUNTER — Ambulatory Visit (INDEPENDENT_AMBULATORY_CARE_PROVIDER_SITE_OTHER): Payer: Medicare HMO | Admitting: Family Medicine

## 2023-05-07 VITALS — BP 127/70 | HR 79 | Ht 63.0 in | Wt 128.5 lb

## 2023-05-07 DIAGNOSIS — G20A2 Parkinson's disease without dyskinesia, with fluctuations: Secondary | ICD-10-CM

## 2023-05-07 DIAGNOSIS — R6889 Other general symptoms and signs: Secondary | ICD-10-CM | POA: Diagnosis not present

## 2023-05-14 ENCOUNTER — Encounter: Payer: Self-pay | Admitting: Internal Medicine

## 2023-05-14 ENCOUNTER — Ambulatory Visit (INDEPENDENT_AMBULATORY_CARE_PROVIDER_SITE_OTHER): Payer: Medicare HMO | Admitting: Internal Medicine

## 2023-05-14 VITALS — BP 122/68 | HR 84 | Temp 98.4°F | Resp 16 | Ht 62.0 in | Wt 128.0 lb

## 2023-05-14 DIAGNOSIS — R6889 Other general symptoms and signs: Secondary | ICD-10-CM | POA: Diagnosis not present

## 2023-05-14 DIAGNOSIS — R300 Dysuria: Secondary | ICD-10-CM

## 2023-05-14 DIAGNOSIS — R051 Acute cough: Secondary | ICD-10-CM

## 2023-05-14 LAB — POCT URINALYSIS DIP (CLINITEK)
Bilirubin, UA: NEGATIVE
Glucose, UA: NEGATIVE mg/dL
Nitrite, UA: POSITIVE — AB
POC PROTEIN,UA: NEGATIVE
Spec Grav, UA: 1.025 (ref 1.010–1.025)
Urobilinogen, UA: 0.2 E.U./dL
pH, UA: 5.5 (ref 5.0–8.0)

## 2023-05-14 MED ORDER — CETIRIZINE HCL 10 MG PO TABS
10.0000 mg | ORAL_TABLET | Freq: Every day | ORAL | 0 refills | Status: AC
Start: 2023-05-14 — End: ?

## 2023-05-14 MED ORDER — DOXYCYCLINE HYCLATE 100 MG PO TABS
100.0000 mg | ORAL_TABLET | Freq: Two times a day (BID) | ORAL | 0 refills | Status: DC
Start: 2023-05-14 — End: 2023-07-17

## 2023-05-14 MED ORDER — FLUTICASONE PROPIONATE 50 MCG/ACT NA SUSP
2.0000 | Freq: Every day | NASAL | 0 refills | Status: AC
Start: 2023-05-14 — End: ?

## 2023-05-14 NOTE — Patient Instructions (Signed)
Thank you, Ms.Marieliz Talamantes for allowing [place] to provide your care today.   I have ordered the following labs for you:   Lab Orders         POCT URINALYSIS DIP (CLINITEK)       - POCT URINALYSIS DIP (CLINITEK)  - use nasal rinse twice daily.  - fluticasone (FLONASE) 50 MCG/ACT nasal spray; Place 2 sprays into both nostrils daily.  Dispense: 15.8 mL; Refill: 0 - cetirizine (ZYRTEC ALLERGY) 10 MG tablet; Take 1 tablet (10 mg total) by mouth daily.  Dispense: 60 tablet; Refill: 0 - doxycycline (VIBRA-TABS) 100 MG tablet; Take 1 tablet (100 mg total) by mouth 2 (two) times daily.  Dispense: 14 tablet; Refill: 0  I will follow up with results of urine culture    Reminders: Follow up if cough is worsening or not improving    Thurmon Fair, M.D.

## 2023-05-14 NOTE — Progress Notes (Unsigned)
   HPI:Ms.Colleen Acevedo is a 77 y.o. female who presents for evaluation of cough. Cough started about a month ago. It is not productive and intermittent.  She was given cough medicine. She is using Nyquil, robitussin, Ricola cough drops, and a nasal spray. The cough has continued. . She coughs almost constantly for the last few days.Marland Kitchen Position does not change the cough. She is also having associated rhinorrhea. She had a similar cough when she was diagnosed with Covid in 2023. No fever, no chest pain. Her daughter had similar symptoms and was diagnosed with pneumonia 2 weeks ago. Has GERD, controlled on omeprazole.   Recently had UTI. Having some discharge dark discharge from pessary. Patient is following up with her Ob/gyn and defers further exam. POC dip stick was collected. Denies any dysuria , fever, or abdominal pain.    Physical Exam: Vitals:   05/14/23 1528  BP: 122/68  Pulse: 84  Resp: 16  Temp: 98.4 F (36.9 C)  TempSrc: Oral  SpO2: 96%  Weight: 128 lb (58.1 kg)  Height: 5\' 2"  (1.575 m)     Physical Exam HENT:     Nose: Congestion and rhinorrhea present.     Mouth/Throat:     Pharynx: Posterior oropharyngeal erythema present. No oropharyngeal exudate.  Cardiovascular:     Rate and Rhythm: Normal rate and regular rhythm.  Pulmonary:     Effort: Pulmonary effort is normal.     Breath sounds: No wheezing or rales.  Abdominal:     General: Bowel sounds are normal.     Palpations: Abdomen is soft.     Tenderness: There is no abdominal tenderness. There is no guarding.      Assessment & Plan:   Reuben was seen today for cough and urinary tract infection.  Dysuria Assessment & Plan: Patient asymptomatic, but request nurse perform dipstick  UA. Reviewed UA, positive leukocytes and nitrites. Hematuria, ketones. Review of culture show E.coli UTI sensitive to doxycycline prescribed for possible bronchitis. If patient is in early stage of repeat UTI likely will be  treated with doxycycline prescribed for bronchitis.  Cultures sent.  Patient deferred pelvic exam today, I recommend following up as planned with ob/gyn for exam and evaluation of change in vaginal discharge.   Orders: -     POCT URINALYSIS DIP (CLINITEK) -     Urine Culture  Acute cough Assessment & Plan: Patient symptoms seem to be related to allergies, congestion , and a post nasal drip causing cough. Cough has become raspy and continuous. Reviewed allergy medicine, not taking Zyrtec daily. She is unsure of nasal spray she is using. Reviewed xray taken for cough on 4/5 , no cardiopulmonary abnormality.   Recommend taking Zrytec daily. Starting nasal rinses and using flonase after nasal rinse.  Given ongoing symptoms will prescribed antibiotic for possible bronchitis, allergic to amoxicillin, prescribed doxycycline x 7 days Follow up if symptoms worsen or fail to improve    Orders: -     Fluticasone Propionate; Place 2 sprays into both nostrils daily.  Dispense: 15.8 mL; Refill: 0 -     Cetirizine HCl; Take 1 tablet (10 mg total) by mouth daily.  Dispense: 60 tablet; Refill: 0 -     Doxycycline Hyclate; Take 1 tablet (100 mg total) by mouth 2 (two) times daily.  Dispense: 14 tablet; Refill: 0      Milus Banister, MD

## 2023-05-15 NOTE — Assessment & Plan Note (Signed)
Patient symptoms seem to be related to allergies, congestion , and a post nasal drip causing cough. Cough has become raspy and continuous. Reviewed allergy medicine, not taking Zyrtec daily. She is unsure of nasal spray she is using. Reviewed xray taken for cough on 4/5 , no cardiopulmonary abnormality.   Recommend taking Zrytec daily. Starting nasal rinses and using flonase after nasal rinse.  Given ongoing symptoms will prescribed antibiotic for possible bronchitis, allergic to amoxicillin, prescribed doxycycline x 7 days Follow up if symptoms worsen or fail to improve

## 2023-05-15 NOTE — Assessment & Plan Note (Addendum)
Patient asymptomatic, but request nurse perform dipstick  UA. Reviewed UA, positive leukocytes and nitrites. Hematuria, ketones. Review of culture show E.coli UTI sensitive to doxycycline prescribed for possible bronchitis. If patient is in early stage of repeat UTI likely will be treated with doxycycline prescribed for bronchitis.  Cultures sent.  Patient deferred pelvic exam today, I recommend following up as planned with ob/gyn for exam and evaluation of change in vaginal discharge.

## 2023-05-18 LAB — URINE CULTURE

## 2023-05-28 DIAGNOSIS — Z4689 Encounter for fitting and adjustment of other specified devices: Secondary | ICD-10-CM | POA: Diagnosis not present

## 2023-05-28 DIAGNOSIS — N390 Urinary tract infection, site not specified: Secondary | ICD-10-CM | POA: Diagnosis not present

## 2023-05-28 DIAGNOSIS — R6889 Other general symptoms and signs: Secondary | ICD-10-CM | POA: Diagnosis not present

## 2023-06-12 ENCOUNTER — Other Ambulatory Visit: Payer: Self-pay | Admitting: Nurse Practitioner

## 2023-06-12 ENCOUNTER — Telehealth: Payer: Self-pay

## 2023-06-12 DIAGNOSIS — E785 Hyperlipidemia, unspecified: Secondary | ICD-10-CM

## 2023-06-12 DIAGNOSIS — F324 Major depressive disorder, single episode, in partial remission: Secondary | ICD-10-CM

## 2023-06-12 NOTE — Telephone Encounter (Signed)
Patient called office and informed patient of new upcoming appt day and time.

## 2023-06-24 DIAGNOSIS — N3941 Urge incontinence: Secondary | ICD-10-CM | POA: Insufficient documentation

## 2023-06-24 DIAGNOSIS — N952 Postmenopausal atrophic vaginitis: Secondary | ICD-10-CM | POA: Insufficient documentation

## 2023-06-24 DIAGNOSIS — N819 Female genital prolapse, unspecified: Secondary | ICD-10-CM | POA: Insufficient documentation

## 2023-06-24 DIAGNOSIS — N390 Urinary tract infection, site not specified: Secondary | ICD-10-CM | POA: Insufficient documentation

## 2023-06-24 DIAGNOSIS — Z960 Presence of urogenital implants: Secondary | ICD-10-CM | POA: Insufficient documentation

## 2023-06-24 DIAGNOSIS — R3915 Urgency of urination: Secondary | ICD-10-CM | POA: Insufficient documentation

## 2023-06-24 NOTE — Progress Notes (Signed)
Name: Colleen Acevedo DOB: 22-Oct-1946 MRN: [id number]  History of Present Illness: Colleen Acevedo is a 77 y.o. female who presents today for follow up visit at [facility] [place]. - GU history: 1. Recurrent UTI. See urine culture history below. 2. Vaginal atrophy. Uses topical vaginal estrogen cream 2-3x/week. 3. OAB with urinary frequency, nocturia, urgency, and urge incontinence. - Previously tried Vesicare 10 mg daily and Myrbetriq 50 mg daily.  - Taking Toviaz 8 mg daily.  4. Pelvic organ prolapse. Managed with #2 ring with support pessary by GYN provider; last pessary check there was 05/28/2023.  At last visit with Dr. Pete Glatter on 03/14/2022: The plan was: - Continue methods to reduce the risk of UTIs discussed including increased fluid intake, timed and double voiding, very supplement, and daily probiotic.   - Continue vaginal hormone cream. - Continue pessary per GYN. - Continue Toviaz 8 mg daily.  - Return to office in 3 months.  Urine culture results in past 12 months: - 06/28/2022: Positive for E. Coli - 10/16/2022: Positive for E. Coli - 01/01/2023: Positive for E. Coli - 01/15/2023: Negative - 02/26/2023: Positive for E. Coli - 05/14/2023: Positive for E. Coli   Today: She reports seeing blood on her pad for the past 1 week. Denies vaginal pain. States she has intermittent vaginal discharge which has been darker than usual recently (brown). She has been using vaginal estrogen cream at a frequency of 2-3 time(s) per week. She reports that her labia are red "inside and outside".   She denies acutely increased urinary urgency, frequency, nocturia, dysuria, gross hematuria, hesitancy, straining to void, or sensations of incomplete emptying.   Fall Screening: Do you usually have a device to assist in your mobility? No   Medications: Current Outpatient Medications  Medication Sig Dispense Refill   acetaminophen (TYLENOL) 500 MG tablet Take 1,000 mg by  mouth daily with breakfast.     Calcium Carbonate-Vitamin D (CALCIUM 500 + D) 500-125 MG-UNIT TABS Take by mouth.     carbidopa-levodopa (SINEMET IR) 25-100 MG tablet Take 2 tablets by mouth 4 (four) times daily. 720 tablet 3   cetirizine (ZYRTEC ALLERGY) 10 MG tablet Take 1 tablet (10 mg total) by mouth daily. 60 tablet 0   CRANBERRY PO Take by mouth.     doxycycline (VIBRA-TABS) 100 MG tablet Take 1 tablet (100 mg total) by mouth 2 (two) times daily. 14 tablet 0   estradiol (ESTRACE) 0.1 MG/GM vaginal cream Place 1 Applicatorful vaginally. Every other day     fluticasone (FLONASE) 50 MCG/ACT nasal spray Place 2 sprays into both nostrils daily. 15.8 mL 0   guaiFENesin (ROBITUSSIN) 100 MG/5ML liquid Take 5 mLs by mouth every 4 (four) hours as needed for cough or to loosen phlegm. 120 mL 0   ibuprofen (ADVIL) 200 MG tablet Take 400 mg by mouth at bedtime.     Lactobacillus (ACIDOPHILUS) 100 MG CAPS Take by mouth.     Omega-3 Fatty Acids (FISH OIL) 500 MG CAPS Take by mouth.     omeprazole (PRILOSEC) 40 MG capsule TAKE 1 CAPSULE EVERY DAY 90 capsule 2   OVER THE COUNTER MEDICATION Velbet El energy antler 250 mg     Probiotic Product (PROBIOTIC ADVANCED PO) Take by mouth.     sertraline (ZOLOFT) 25 MG tablet TAKE 2 TABLETS EVERY DAY 180 tablet 3   simvastatin (ZOCOR) 40 MG tablet TAKE 1 TABLET EVERY DAY 90 tablet 3   vitamin B-12 (CYANOCOBALAMIN) 100 MCG tablet  Take 100 mcg by mouth daily.     fesoterodine (TOVIAZ) 8 MG TB24 tablet Take 1 tablet (8 mg total) by mouth daily. 90 tablet 3   No current facility-administered medications for this visit.    Allergies: Allergies  Allergen Reactions   Amoxicillin Itching and Rash    Past Medical History:  Diagnosis Date   Anxiety disorder    Mixed hyperlipidemia    Parkinson's disease    Urinary incontinence    uses pessary   Past Surgical History:  Procedure Laterality Date   BACK SURGERY     BREAST BIOPSY Right    KNEE SURGERY      VAGINAL HYSTERECTOMY     Family History  Problem Relation Age of Onset   Colon cancer Father    Diabetes Mother    Heart attack Brother    Colon cancer Sister    Breast cancer Sister    Heart attack Brother    Thyroid disease Daughter    Social History   Socioeconomic History   Marital status: Divorced    Spouse name: Not on file   Number of children: 2   Years of education: Not on file   Highest education level: Not on file  Occupational History    Comment: retired  Tobacco Use   Smoking status: Never   Smokeless tobacco: Never  Vaping Use   Vaping status: Never Used  Substance and Sexual Activity   Alcohol use: Never   Drug use: Never   Sexual activity: Not Currently    Birth control/protection: Surgical    Comment: hyst  Other Topics Concern   Not on file  Social History Narrative   Lives at home alone   Right handed   Caffeine: occasional   Social Determinants of Health   Financial Resource Strain: Medium Risk (12/18/2022)   Overall Financial Resource Strain (CARDIA)    Difficulty of Paying Living Expenses: Somewhat hard  Food Insecurity: No Food Insecurity (12/18/2022)   Hunger Vital Sign    Worried About Running Out of Food in the Last Year: Never true    Ran Out of Food in the Last Year: Never true  Transportation Needs: No Transportation Needs (12/18/2022)   PRAPARE - Administrator, Civil Service (Medical): No    Lack of Transportation (Non-Medical): No  Physical Activity: Sufficiently Active (12/18/2022)   Exercise Vital Sign    Days of Exercise per Week: 7 days    Minutes of Exercise per Session: 120 min  Stress: Stress Concern Present (12/18/2022)   [employer] of Occupational Health - Occupational Stress Questionnaire    Feeling of Stress : To some extent  Social Connections: Socially Isolated (12/18/2022)   Social Connection and Isolation Panel [NHANES]    Frequency of Communication with Friends and Family: More than three times a  week    Frequency of Social Gatherings with Friends and Family: More than three times a week    Attends Religious Services: Never    Database administrator or Organizations: No    Attends Banker Meetings: Never    Marital Status: Divorced  Catering manager Violence: Not At Risk (12/18/2022)   Humiliation, Afraid, Rape, and Kick questionnaire    Fear of Current or Ex-Partner: No    Emotionally Abused: No    Physically Abused: No    Sexually Abused: No    Review of Systems Constitutional: Patient denies any unintentional weight loss or change in strength lntegumentary: Patient  denies any rashes or pruritus Cardiovascular: Patient denies chest pain or syncope Respiratory: Patient denies shortness of breath Gastrointestinal: Patient denies nausea, vomiting, constipation, or diarrhea Musculoskeletal: Patient denies muscle cramps or weakness Neurologic: Patient denies convulsions or seizures Psychiatric: Patient denies memory problems Allergic/Immunologic: Patient denies recent allergic reaction(s) Hematologic/Lymphatic: Patient denies bleeding tendencies Endocrine: Patient denies heat/cold intolerance  GU: As per HPI.  OBJECTIVE Vitals:   06/27/23 0908  BP: 117/67  Pulse: 81  Temp: 98.3 F (36.8 C)   There is no height or weight on file to calculate BMI.  Physical Examination Constitutional: No obvious distress; patient is non-toxic appearing  Cardiovascular: No visible lower extremity edema.  Respiratory: The patient does not have audible wheezing/stridor; respirations do not appear labored  Gastrointestinal: Abdomen non-distended Musculoskeletal: Normal ROM of UEs  Skin: No obvious rashes/open sores  Neurologic: CN 2-12 grossly intact Psychiatric: Answered questions appropriately with normal affect  Hematologic/Lymphatic/Immunologic: No obvious bruises or sites of spontaneous bleeding  Detailed Urogynecologic Evaluation: Pelvic Exam: External genitalia   Mild bilateral labia erythema and xerosis. No labial lesions. Bartholin's and Skene's glands normal in appearance. She has normal sensation to light touch. There is not stool contamination of the perineum noted. Urethral meatus midline and negative  for discharge or masses. Positive for caruncle with mild erythema. Urethra positive  for tenderness or masses.  Bladder negative  for tenderness or distention. Vagina Atrophic with narrow introitus. Pessary removed and noted to have blood-tinged discharge on it. Speculum exam revealed superficial ulceration at vaginal cuff. The pessary was replaced without any difficulty.  Rectal Exam: Anal verge looks normal without hemorrhoids.  Pelvic exam was chaperoned by H. Cobb, LPN.  UA (I&O cath): negative  PVR (I&O cath): 25 ml  ASSESSMENT Recurrent UTI - Plan: BLADDER SCAN AMB NON-IMAGING, CANCELED: Urinalysis, Routine w reflex microscopic  Vaginal atrophy - Plan: BLADDER SCAN AMB NON-IMAGING, Ambulatory referral to Urology, CANCELED: Urinalysis, Routine w reflex microscopic  Female genital prolapse, unspecified type - Plan: BLADDER SCAN AMB NON-IMAGING, Ambulatory referral to Urology, CANCELED: Urinalysis, Routine w reflex microscopic  Presence of pessary - Plan: BLADDER SCAN AMB NON-IMAGING, Ambulatory referral to Urology, CANCELED: Urinalysis, Routine w reflex microscopic  OAB (overactive bladder), diagnosed 11/06/20 - Plan: BLADDER SCAN AMB NON-IMAGING, fesoterodine (TOVIAZ) 8 MG TB24 tablet, CANCELED: Urinalysis, Routine w reflex microscopic  Urinary frequency - Plan: BLADDER SCAN AMB NON-IMAGING, fesoterodine (TOVIAZ) 8 MG TB24 tablet, CANCELED: Urinalysis, Routine w reflex microscopic  Nocturia - Plan: BLADDER SCAN AMB NON-IMAGING, fesoterodine (TOVIAZ) 8 MG TB24 tablet, CANCELED: Urinalysis, Routine w reflex microscopic  Urinary urgency - Plan: BLADDER SCAN AMB NON-IMAGING, fesoterodine (TOVIAZ) 8 MG TB24 tablet, CANCELED: Urinalysis,  Routine w reflex microscopic  Urge incontinence - Plan: BLADDER SCAN AMB NON-IMAGING, fesoterodine (TOVIAZ) 8 MG TB24 tablet, CANCELED: Urinalysis, Routine w reflex microscopic  Vaginal bleeding - Plan: Ambulatory referral to Urology  Vaginal erosion secondary to pessary use, initial encounter [facility]) - Plan: Ambulatory referral to Urology  Patient was reassured that there is no evidence of hematuria at this time; blood on pads found to be due to vaginal bleeding related to pessary use with superficial vaginal mucosal ulceration. Advised patient to use her topical vaginal estrogen cream nightly for the next 2-4 weeks and to schedule follow up with her GYN provider in the near future for recheck. For external labial irritation secondary to her vulvovaginal atrophy, advised topical use of Vaseline PRN for comfort / skin protection.  She requested surgical consultation for prolapse repair -  we discussed option to follow up with her established GYN provider, referral to urogynecology, or referral to a see one of the urologists at [facility] who do those types of procedures (Dr. Arita Miss, Dr. Marlou Porch, or Dr. Sherron Monday). She elected to follow up at [facility]. Referral placed.   Refills sent for Toviaz for her OAB.   Pt verbalized understanding and agreement. All questions were answered.  PLAN Advised the following: 1. Continue Toviaz daily. 2. Nightly use of topical vaginal estrogen cream. 3. Follow up with GYN provider within the next 4 weeks. 4. Topical Vaseline PRN. 5. Return for follow up at [facility] for prolapse surgical consultation.  Orders Placed This Encounter  Procedures   Ambulatory referral to Urology    Referral Priority:   Routine    Referral Type:   Consultation    Referral Reason:   Specialty Services Required    Requested Specialty:   Urology    Number of Visits Requested:   1   BLADDER SCAN AMB NON-IMAGING   Total time spent caring for the patient today  was over 30 minutes. This includes time spent on the date of the visit reviewing the patient's chart before the visit, time spent during the visit, and time spent after the visit on documentation. Over 50% of that time was spent in face-to-face time with this patient for direct counseling. E&M based on time and complexity of medical decision making.  It has been explained that the patient is to follow regularly with their PCP in addition to all other providers involved in their care and to follow instructions provided by these respective offices. Patient advised to contact urology clinic if any urologic-pertaining questions, concerns, new symptoms or problems arise in the interim period.  There are no Patient Instructions on file for this visit.  Electronically signed by:  Donnita Falls, FNP   06/27/23    10:52 AM

## 2023-06-27 ENCOUNTER — Ambulatory Visit: Payer: Medicare HMO | Admitting: Urology

## 2023-06-27 ENCOUNTER — Encounter: Payer: Self-pay | Admitting: Urology

## 2023-06-27 ENCOUNTER — Telehealth: Payer: Self-pay

## 2023-06-27 VITALS — BP 117/67 | HR 81 | Temp 98.3°F

## 2023-06-27 DIAGNOSIS — N952 Postmenopausal atrophic vaginitis: Secondary | ICD-10-CM

## 2023-06-27 DIAGNOSIS — N3281 Overactive bladder: Secondary | ICD-10-CM | POA: Diagnosis not present

## 2023-06-27 DIAGNOSIS — R351 Nocturia: Secondary | ICD-10-CM

## 2023-06-27 DIAGNOSIS — N819 Female genital prolapse, unspecified: Secondary | ICD-10-CM | POA: Diagnosis not present

## 2023-06-27 DIAGNOSIS — R3915 Urgency of urination: Secondary | ICD-10-CM

## 2023-06-27 DIAGNOSIS — N939 Abnormal uterine and vaginal bleeding, unspecified: Secondary | ICD-10-CM

## 2023-06-27 DIAGNOSIS — Z960 Presence of urogenital implants: Secondary | ICD-10-CM

## 2023-06-27 DIAGNOSIS — N898 Other specified noninflammatory disorders of vagina: Secondary | ICD-10-CM

## 2023-06-27 DIAGNOSIS — N3941 Urge incontinence: Secondary | ICD-10-CM

## 2023-06-27 DIAGNOSIS — R6889 Other general symptoms and signs: Secondary | ICD-10-CM | POA: Diagnosis not present

## 2023-06-27 DIAGNOSIS — R350 Frequency of micturition: Secondary | ICD-10-CM

## 2023-06-27 DIAGNOSIS — N390 Urinary tract infection, site not specified: Secondary | ICD-10-CM

## 2023-06-27 LAB — URINALYSIS, ROUTINE W REFLEX MICROSCOPIC
Bilirubin, UA: NEGATIVE
Glucose, UA: NEGATIVE
Leukocytes,UA: NEGATIVE
Nitrite, UA: NEGATIVE
Protein,UA: NEGATIVE
RBC, UA: NEGATIVE
Specific Gravity, UA: 1.02 (ref 1.005–1.030)
Urobilinogen, Ur: 0.2 mg/dL (ref 0.2–1.0)
pH, UA: 6 (ref 5.0–7.5)

## 2023-06-27 LAB — BLADDER SCAN AMB NON-IMAGING: Scan Result: 76

## 2023-06-27 MED ORDER — FESOTERODINE FUMARATE ER 8 MG PO TB24
8.0000 mg | ORAL_TABLET | Freq: Every day | ORAL | 3 refills | Status: DC
Start: 2023-06-27 — End: 2024-04-12

## 2023-06-27 MED ORDER — FESOTERODINE FUMARATE ER 8 MG PO TB24
8.0000 mg | ORAL_TABLET | Freq: Every day | ORAL | 3 refills | Status: DC
Start: 2023-06-27 — End: 2023-06-27

## 2023-06-27 NOTE — Telephone Encounter (Signed)
Return call to patient from After hour line. Unable to reach patient via phone left voiced message for return call.

## 2023-07-10 DIAGNOSIS — E559 Vitamin D deficiency, unspecified: Secondary | ICD-10-CM | POA: Diagnosis not present

## 2023-07-10 DIAGNOSIS — E7849 Other hyperlipidemia: Secondary | ICD-10-CM | POA: Diagnosis not present

## 2023-07-10 DIAGNOSIS — R7301 Impaired fasting glucose: Secondary | ICD-10-CM | POA: Diagnosis not present

## 2023-07-10 DIAGNOSIS — E038 Other specified hypothyroidism: Secondary | ICD-10-CM | POA: Diagnosis not present

## 2023-07-11 LAB — CBC WITH DIFFERENTIAL/PLATELET: Neutrophils Absolute: 4.4 10*3/uL (ref 1.4–7.0)

## 2023-07-11 LAB — CMP14+EGFR: Potassium: 4.2 mmol/L (ref 3.5–5.2)

## 2023-07-14 NOTE — Progress Notes (Signed)
Please inform the patient that her labs are stable

## 2023-07-16 ENCOUNTER — Ambulatory Visit (INDEPENDENT_AMBULATORY_CARE_PROVIDER_SITE_OTHER): Payer: Medicare HMO | Admitting: Family Medicine

## 2023-07-16 VITALS — BP 110/68 | HR 80 | Ht 62.0 in | Wt 127.0 lb

## 2023-07-16 DIAGNOSIS — J069 Acute upper respiratory infection, unspecified: Secondary | ICD-10-CM

## 2023-07-16 DIAGNOSIS — J302 Other seasonal allergic rhinitis: Secondary | ICD-10-CM

## 2023-07-16 DIAGNOSIS — E559 Vitamin D deficiency, unspecified: Secondary | ICD-10-CM

## 2023-07-16 DIAGNOSIS — R350 Frequency of micturition: Secondary | ICD-10-CM

## 2023-07-16 DIAGNOSIS — H6121 Impacted cerumen, right ear: Secondary | ICD-10-CM

## 2023-07-16 DIAGNOSIS — R351 Nocturia: Secondary | ICD-10-CM

## 2023-07-16 DIAGNOSIS — N390 Urinary tract infection, site not specified: Secondary | ICD-10-CM | POA: Diagnosis not present

## 2023-07-16 DIAGNOSIS — F324 Major depressive disorder, single episode, in partial remission: Secondary | ICD-10-CM

## 2023-07-16 DIAGNOSIS — N3281 Overactive bladder: Secondary | ICD-10-CM

## 2023-07-16 DIAGNOSIS — N3941 Urge incontinence: Secondary | ICD-10-CM

## 2023-07-16 DIAGNOSIS — G20A1 Parkinson's disease without dyskinesia, without mention of fluctuations: Secondary | ICD-10-CM | POA: Diagnosis not present

## 2023-07-16 DIAGNOSIS — R3915 Urgency of urination: Secondary | ICD-10-CM

## 2023-07-16 DIAGNOSIS — H109 Unspecified conjunctivitis: Secondary | ICD-10-CM | POA: Diagnosis not present

## 2023-07-16 DIAGNOSIS — E7849 Other hyperlipidemia: Secondary | ICD-10-CM | POA: Diagnosis not present

## 2023-07-16 DIAGNOSIS — R7301 Impaired fasting glucose: Secondary | ICD-10-CM

## 2023-07-16 DIAGNOSIS — E038 Other specified hypothyroidism: Secondary | ICD-10-CM | POA: Diagnosis not present

## 2023-07-16 MED ORDER — POLYMYXIN B-TRIMETHOPRIM 10000-0.1 UNIT/ML-% OP SOLN
OPHTHALMIC | 0 refills | Status: AC
Start: 2023-07-16 — End: ?

## 2023-07-16 MED ORDER — GUAIFENESIN 100 MG/5ML PO LIQD: 5.0000 mL | ORAL | 1 refills | Status: AC | PRN

## 2023-07-16 NOTE — Assessment & Plan Note (Signed)
Stable on sinemet Reports following up with neurology next month

## 2023-07-16 NOTE — Assessment & Plan Note (Signed)
Ear irrigation performed.

## 2023-07-16 NOTE — Assessment & Plan Note (Signed)
Complains of urinary frequency, urgency, and pain with urination For two weeks Pending Urinalysis Of note,the patient is on fesoterodine (TOVIAZ) 8 mg daily for urge incontinence and is following up with neurology

## 2023-07-16 NOTE — Assessment & Plan Note (Signed)
Stable on simvastatin 40 mg daily Encouraged heart healthy diet with increase physical activity Pending lipid panel Lab Results  Component Value Date   CHOL 128 07/10/2023   HDL 50 07/10/2023   LDLCALC 65 07/10/2023   TRIG 62 07/10/2023   CHOLHDL 2.6 07/10/2023

## 2023-07-16 NOTE — Assessment & Plan Note (Signed)
Encouraged to continue taking zyrtec for seasonal allergies Refilled guaifenesin robitussin for cough

## 2023-07-16 NOTE — Progress Notes (Signed)
Established Patient Office Visit  Subjective:  Patient ID: Colleen Acevedo, female    DOB: [birth year]  Age: 77 y.o. MRN: [id number]  CC:  Chief Complaint  Patient presents with   Gastroesophageal Reflux    F/u for [facility]   Neurologic Problem    F/u for Neuropathy and Parkinson's    Urinary Tract Infection    Pt. Would like to check for UTI pt. Has already given a urine sample in lab.    Eye Drainage    Pt. Grandchildren have and eye infection she is having symptoms.    ear blockage    Pt. Is having trouble hearing out of Rt. Ear.    Cough    Pt. Is having a consistent dry cough x3 months.   Nasal Congestion    Complains of nasal  congestion x1 week    Immunizations    Pt. Reports of receiving Shingles and Tdap on 8/13 along with provider requested labs. Pt. Declines Flu vaccine at this time.     HPI Colleen Acevedo is a 77 y.o. female with past medical history of hyperlipidemia, depression, and GERD presents for f/u of  chronic medical conditions. For the details of today's visit, please refer to the assessment and plan.     Past Medical History:  Diagnosis Date   Anxiety disorder    Mixed hyperlipidemia    Parkinson's disease    Urinary incontinence    uses pessary    Past Surgical History:  Procedure Laterality Date   BACK SURGERY     BREAST BIOPSY Right    KNEE SURGERY     VAGINAL HYSTERECTOMY      Family History  Problem Relation Age of Onset   Colon cancer Father    Diabetes Mother    Heart attack Brother    Colon cancer Sister    Breast cancer Sister    Heart attack Brother    Thyroid disease Daughter     Social History   Socioeconomic History   Marital status: Divorced    Spouse name: Not on file   Number of children: 2   Years of education: Not on file   Highest education level: Not on file  Occupational History    Comment: retired  Tobacco Use   Smoking status: Never   Smokeless tobacco: Never  Vaping Use   Vaping status: Never  Used  Substance and Sexual Activity   Alcohol use: Never   Drug use: Never   Sexual activity: Not Currently    Birth control/protection: Surgical    Comment: hyst  Other Topics Concern   Not on file  Social History Narrative   Lives at home alone   Right handed   Caffeine: occasional   Social Determinants of Health   Financial Resource Strain: Medium Risk (12/18/2022)   Overall Financial Resource Strain (CARDIA)    Difficulty of Paying Living Expenses: Somewhat hard  Food Insecurity: No Food Insecurity (12/18/2022)   Hunger Vital Sign    Worried About Running Out of Food in the Last Year: Never true    Ran Out of Food in the Last Year: Never true  Transportation Needs: No Transportation Needs (12/18/2022)   PRAPARE - Administrator, Civil Service (Medical): No    Lack of Transportation (Non-Medical): No  Physical Activity: Sufficiently Active (12/18/2022)   Exercise Vital Sign    Days of Exercise per Week: 7 days    Minutes of Exercise per Session: 120 min  Stress: Stress Concern Present (12/18/2022)   [employer] of Occupational Health - Occupational Stress Questionnaire    Feeling of Stress : To some extent  Social Connections: Socially Isolated (12/18/2022)   Social Connection and Isolation Panel [NHANES]    Frequency of Communication with Friends and Family: More than three times a week    Frequency of Social Gatherings with Friends and Family: More than three times a week    Attends Religious Services: Never    Database administrator or Organizations: No    Attends Banker Meetings: Never    Marital Status: Divorced  Catering manager Violence: Not At Risk (12/18/2022)   Humiliation, Afraid, Rape, and Kick questionnaire    Fear of Current or Ex-Partner: No    Emotionally Abused: No    Physically Abused: No    Sexually Abused: No    Outpatient Medications Prior to Visit  Medication Sig Dispense Refill   acetaminophen (TYLENOL) 500 MG  tablet Take 1,000 mg by mouth daily with breakfast.     Calcium Carbonate-Vitamin D (CALCIUM 500 + D) 500-125 MG-UNIT TABS Take by mouth.     carbidopa-levodopa (SINEMET IR) 25-100 MG tablet Take 2 tablets by mouth 4 (four) times daily. 720 tablet 3   cetirizine (ZYRTEC ALLERGY) 10 MG tablet Take 1 tablet (10 mg total) by mouth daily. 60 tablet 0   CRANBERRY PO Take by mouth.     estradiol (ESTRACE) 0.1 MG/GM vaginal cream Place 1 Applicatorful vaginally. Every other day     fesoterodine (TOVIAZ) 8 MG TB24 tablet Take 1 tablet (8 mg total) by mouth daily. 90 tablet 3   fluticasone (FLONASE) 50 MCG/ACT nasal spray Place 2 sprays into both nostrils daily. 15.8 mL 0   ibuprofen (ADVIL) 200 MG tablet Take 400 mg by mouth at bedtime.     Lactobacillus (ACIDOPHILUS) 100 MG CAPS Take by mouth.     Omega-3 Fatty Acids (FISH OIL) 500 MG CAPS Take by mouth.     omeprazole (PRILOSEC) 40 MG capsule TAKE 1 CAPSULE EVERY DAY 90 capsule 2   OVER THE COUNTER MEDICATION Velbet El energy antler 250 mg     Probiotic Product (PROBIOTIC ADVANCED PO) Take by mouth.     sertraline (ZOLOFT) 25 MG tablet TAKE 2 TABLETS EVERY DAY 180 tablet 3   simvastatin (ZOCOR) 40 MG tablet TAKE 1 TABLET EVERY DAY 90 tablet 3   vitamin B-12 (CYANOCOBALAMIN) 100 MCG tablet Take 100 mcg by mouth daily.     doxycycline (VIBRA-TABS) 100 MG tablet Take 1 tablet (100 mg total) by mouth 2 (two) times daily. 14 tablet 0   guaiFENesin (ROBITUSSIN) 100 MG/5ML liquid Take 5 mLs by mouth every 4 (four) hours as needed for cough or to loosen phlegm. 120 mL 0   No facility-administered medications prior to visit.    Allergies  Allergen Reactions   Amoxicillin Itching and Rash    ROS Review of Systems  Constitutional:  Negative for chills and fever.  HENT:         Decreased hearing in right ear  Eyes:  Positive for discharge. Negative for visual disturbance.  Respiratory:  Negative for chest tightness and shortness of breath.    Genitourinary:  Positive for dysuria, frequency and urgency.  Neurological:  Negative for dizziness and headaches.      Objective:    Physical Exam HENT:     Head: Normocephalic.     Right Ear: There is impacted cerumen.  Nose: Rhinorrhea present. No congestion.     Mouth/Throat:     Mouth: Mucous membranes are moist.  Eyes:     General:        Right eye: Discharge present.  Cardiovascular:     Rate and Rhythm: Normal rate.     Heart sounds: Normal heart sounds.  Pulmonary:     Effort: Pulmonary effort is normal.     Breath sounds: Normal breath sounds.  Neurological:     Mental Status: She is alert.     BP 110/68 (BP Location: Right Arm, Patient Position: Sitting, Cuff Size: Normal)   Pulse 80   Ht 5\' 2"  (1.575 m)   Wt 127 lb (57.6 kg)   SpO2 92%   BMI 23.23 kg/m  Wt Readings from Last 3 Encounters:  07/16/23 127 lb (57.6 kg)  05/14/23 128 lb (58.1 kg)  05/07/23 128 lb 8 oz (58.3 kg)    Lab Results  Component Value Date   TSH 2.860 07/10/2023   Lab Results  Component Value Date   WBC 6.6 07/10/2023   HGB 13.6 07/10/2023   HCT 41.1 07/10/2023   MCV 88 07/10/2023   PLT 256 07/10/2023   Lab Results  Component Value Date   NA 142 07/10/2023   K 4.2 07/10/2023   CO2 24 07/10/2023   GLUCOSE 88 07/10/2023   BUN 24 07/10/2023   CREATININE 0.61 07/10/2023   BILITOT 0.5 07/10/2023   ALKPHOS 96 07/10/2023   AST 10 07/10/2023   ALT <5 07/10/2023   PROT 6.4 07/10/2023   ALBUMIN 4.0 07/10/2023   CALCIUM 9.3 07/10/2023   EGFR 91 07/10/2023   Lab Results  Component Value Date   CHOL 128 07/10/2023   Lab Results  Component Value Date   HDL 50 07/10/2023   Lab Results  Component Value Date   LDLCALC 65 07/10/2023   Lab Results  Component Value Date   TRIG 62 07/10/2023   Lab Results  Component Value Date   CHOLHDL 2.6 07/10/2023   Lab Results  Component Value Date   HGBA1C 5.2 07/10/2023      Assessment & Plan:  Bacterial  conjunctivitis of right eye Assessment & Plan: The patient reports a purulent discharge from the right eye and notes that the eye often sticks shut in the morning. -These symptoms have been present for the past 2 days. -Today, I will initiate treatment with topical antibiotic ophthalmic drops.  Education as follow:   Prevention and Self-Care: -Hand Hygiene: Wash hands frequently with soap and water, especially before touching your eyes. -Avoid Touching Eyes: Do not rub or touch your eyes to prevent the spread of infection. -Avoid Sharing Personal Items: Do not share towels, pillowcases, or eye makeup with others to avoid spreading the infection. -Dispose of Contaminated Items: Replace or clean any items that may have come into contact with the infected eye, such as towels or pillowcases. -Stay Home: Avoid close contact with others and stay home from work or school until the infection is no longer contagious.  Orders: -     Polymyxin B-Trimethoprim; Apply 1 to 2 drops 4 times daily for 7 days  Dispense: 10 mL; Refill: 0  Impacted cerumen of right ear Assessment & Plan: Ear irrigation performed    Recurrent UTI Assessment & Plan: Complains of urinary frequency, urgency, and pain with urination for two weeks UA positive for nitrates Will treat with Bactrim for 5 days Please complete the full course of the antibiotics  You can help prevent UTIs by doing the following:  -Avoid holding urine for prolonged periods; this stretches the bladder and causes bacteria to form because bacteria like warm and wet environments to grow -Empty the bladder as soon as the need arises.  -Empty your bladder soon after intercourse.  -Take showers instead of baths -Wipe front to back; doing so after urinating and after a bowel movement helps prevent bacteria in the anal region from spreading to the vagina and urethra. -Also, drink a full glass of water to help flush bacteria.     Orders: -      Urinalysis -     Sulfamethoxazole-Trimethoprim; Take 1 tablet by mouth 2 (two) times daily for 5 days.  Dispense: 10 tablet; Refill: 0  Seasonal allergies Assessment & Plan: Encouraged to continue taking zyrtec for seasonal allergies Refilled guaifenesin robitussin for cough   Parkinson's disease without fluctuating manifestations, unspecified whether dyskinesia present Assessment & Plan: Stable on sinemet Reports following up with neurology next month   Depression, major, single episode, in partial remission (HCC) Assessment & Plan: PHQ-9 0 Stable on Zoloft 25 mg daily Denies suicidal thoughts and ideation Reviewed nonpharmacological management of depression    Other hyperlipidemia Assessment & Plan: Stable on simvastatin 40 mg daily Encouraged heart healthy diet with increase physical activity Pending lipid panel Lab Results  Component Value Date   CHOL 128 07/10/2023   HDL 50 07/10/2023   LDLCALC 65 07/10/2023   TRIG 62 07/10/2023   CHOLHDL 2.6 07/10/2023     Orders: -     Lipid panel -     CMP14+EGFR -     CBC with Differential/Platelet  Other specified hypothyroidism -     TSH + free T4  Viral upper respiratory illness -     guaiFENesin; Take 5 mLs by mouth every 4 (four) hours as needed for cough or to loosen phlegm.  Dispense: 120 mL; Refill: 1  Vitamin D deficiency -     VITAMIN D 25 Hydroxy (Vit-D Deficiency, Fractures)  IFG (impaired fasting glucose) -     Hemoglobin A1c  Note: This chart has been completed using Engineer, civil (consulting) software, and while attempts have been made to ensure accuracy, certain words and phrases may not be transcribed as intended.    Follow-up: Return in about 4 months (around 11/15/2023).   Gilmore Laroche, FNP

## 2023-07-16 NOTE — Assessment & Plan Note (Addendum)
The patient reports a purulent discharge from the right eye and notes that the eye often sticks shut in the morning. -These symptoms have been present for the past 2 days. -Today, I will initiate treatment with topical antibiotic ophthalmic drops.  Education as follow:   Prevention and Self-Care: -Hand Hygiene: Wash hands frequently with soap and water, especially before touching your eyes. -Avoid Touching Eyes: Do not rub or touch your eyes to prevent the spread of infection. -Avoid Sharing Personal Items: Do not share towels, pillowcases, or eye makeup with others to avoid spreading the infection. -Dispose of Contaminated Items: Replace or clean any items that may have come into contact with the infected eye, such as towels or pillowcases. -Stay Home: Avoid close contact with others and stay home from work or school until the infection is no longer contagious.

## 2023-07-16 NOTE — Patient Instructions (Addendum)
I appreciate the opportunity to provide care to you today!    Follow up:  4 months  Labs: please stop by the lab 2-3 days before your next appointment to get your blood drawn (CBC, CMP, TSH, Lipid profile, HgA1c, Vit D)    Non-pharmacological interventions for nasal congestion   Steam Inhalation: Steam Therapy: Inhaling steam can help loosen mucus and reduce congestion. You can do this by taking a hot shower or inhaling steam from a bowl of hot water with a towel over your head to trap the steam. Nasal Irrigation: Saline Rinse: Using a saline nasal spray or a neti pot can help flush out mucus and allergens, reducing congestion. Make sure to use distilled, sterile, or previously boiled water for the rinse. Humidification: Humidifier: Using a humidifier in your home, especially in your bedroom, adds moisture to the air, which can help soothe irritated nasal passages and reduce congestion. Cool Mist Vaporizer: A cool mist vaporizer can also be effective in keeping your nasal passages moist. Hydration: Drink Plenty of Fluids: Staying well-hydrated helps thin mucus, making it easier to clear from the nasal passages. Warm fluids like tea, broth, or soup can be particularly soothing. Elevate Your Head: Sleep Position: Elevating your head while sleeping can help reduce nasal congestion by allowing mucus to drain more easily. Use an extra pillow or elevate the head of your bed. Warm Compress: Warm Compress: Applying a warm compress to the nose and forehead can help open the nasal passages and relieve sinus pressure. Avoid Irritants: Avoid Smoking and Strong Odors: Smoke, strong perfumes, and other irritants can exacerbate nasal congestion. Avoiding these can help reduce symptoms. Reduce Allergen Exposure: If allergies are contributing to your congestion, minimizing exposure to allergens like dust, pollen, and pet dander can be helpful. Essential Oils: Peppermint or Eucalyptus Oil: Inhaling the  vapors of peppermint or eucalyptus oil can help clear nasal passages. You can add a few drops to a diffuser or a bowl of hot water for steam inhalation. Breathing Exercises: Deep Breathing: Practicing deep breathing exercises can help improve airflow through the nasal passages and reduce congestion. Rest and Relaxation: Get [place] of Rest: Ensuring adequate rest allows your body to recover and can help alleviate congestion, especially if it's related to a cold or respiratory infection.      Please continue to a heart-healthy diet and increase your physical activities. Try to exercise for at least five days a week.    It was a pleasure to see you and I look forward to continuing to work together on your health and well-being. Please do not hesitate to call the office if you need care or have questions about your care.  In case of emergency, please visit the Emergency Department for urgent care, or contact our clinic at [phone] to schedule an appointment. We're here to help you!   Have a wonderful day and week. With Gratitude, Gilmore Laroche MSN, FNP-BC

## 2023-07-16 NOTE — Assessment & Plan Note (Signed)
PHQ-9 0 Stable on Zoloft 25 mg daily Denies suicidal thoughts and ideation Reviewed nonpharmacological management of depression

## 2023-07-17 MED ORDER — SULFAMETHOXAZOLE-TRIMETHOPRIM 800-160 MG PO TABS
1.0000 | ORAL_TABLET | Freq: Two times a day (BID) | ORAL | 0 refills | Status: AC
Start: 2023-07-17 — End: 2023-07-22

## 2023-07-17 NOTE — Addendum Note (Signed)
Addended byGilmore Laroche on: 07/17/2023 09:32 AM   Modules accepted: Orders

## 2023-07-30 ENCOUNTER — Telehealth: Payer: Self-pay | Admitting: Orthopedic Surgery

## 2023-07-30 NOTE — Telephone Encounter (Signed)
Returned the patient's call, lvm for her to call back to schedule.  She is wanting an injection, bil shoulders.

## 2023-08-06 ENCOUNTER — Ambulatory Visit (INDEPENDENT_AMBULATORY_CARE_PROVIDER_SITE_OTHER): Payer: Medicare HMO | Admitting: Orthopedic Surgery

## 2023-08-06 DIAGNOSIS — M19011 Primary osteoarthritis, right shoulder: Secondary | ICD-10-CM | POA: Diagnosis not present

## 2023-08-06 DIAGNOSIS — M19012 Primary osteoarthritis, left shoulder: Secondary | ICD-10-CM

## 2023-08-06 NOTE — Patient Instructions (Signed)

## 2023-08-07 NOTE — Progress Notes (Signed)
Orthopaedic Clinic Return  Assessment: Colleen Acevedo is a 77 y.o. female with the following: Right glenohumeral arthritis Left glenohumeral arthritis  Plan: Colleen Acevedo glenohumeral joint arthritis in bilateral shoulders.  Right is worse than left.  She has previously had excellent improvement of her symptoms following ultrasound-guided injection on the right.  She would like to repeat this today.  In addition, she would like to have her left shoulder injected as well.  These were completed in clinic today without issues.  She will follow-up as needed.  Procedure note injection - Right shoulder, ultrasound guidance   Verbal consent was obtained to inject the Right shoulder, glenohumeral joint  Timeout was completed to confirm the site of injection.   Using the ultrasound, the rotator cuff tendons were identified.  The joint space was also identified. The skin was prepped with alcohol and ethyl chloride was sprayed at the injection site.  A 21-gauge needle was used to inject 40 mg of Depo-Medrol and 1% lidocaine (4 cc) into the glenohumeral joint space of the Right shoulder using a posterolateral approach.  The needle was visualized entering the glenohumeral joint, and the medication was also visualized. There were no complications.  A sterile bandage was applied.   Note: In order to accurately identify the placement of the needle, ultrasound was required, to increase the accuracy, and specificity of the injection.   Procedure note injection - Left shoulder, ultrasound guidance   Verbal consent was obtained to inject the Left shoulder, glenohumeral joint  Timeout was completed to confirm the site of injection.   Using the ultrasound, the rotator cuff tendons were identified.  The joint space was also identified. The skin was prepped with alcohol and ethyl chloride was sprayed at the injection site.  A 21-gauge needle was used to inject 40 mg of Depo-Medrol and 1% lidocaine (4 cc)  into the glenohumeral joint space of the Left shoulder using a posterolateral approach.  The needle was visualized entering the glenohumeral joint, and the medication was also visualized. There were no complications.  A sterile bandage was applied.   Note: In order to accurately identify the placement of the needle, ultrasound was required, to increase the accuracy, and specificity of the injection.    Follow-up: Return if symptoms worsen or fail to improve.   Subjective:  Chief Complaint  Patient presents with   Injections    R shoulder     History of Present Illness: Colleen Acevedo is a 77 y.o. female who returns to clinic for evaluation of bilateral shoulder pain.  I have previously seen her for pain in both shoulders.  Most recently, I injected the right glenohumeral joint with ultrasound.  This provided excellent improvement in her symptoms for several months.  She would like to repeat injections for both shoulders.   Review of Systems: No fevers or chills No numbness or tingling No chest pain No shortness of breath No bowel or bladder dysfunction No GI distress No headaches   Objective: There were no vitals taken for this visit.  Physical Exam:  Alert and oriented.  No acute distress.  Active motion intact in her right hand.  No deformity about the right shoulder.  2+ radial pulse.  Passive forward flexion to 110 degrees before it is uncomfortable.  External rotation is limited compared to the contralateral side.  No deformity of the left shoulder.  Sensation intact in the left hand.  Passive forward flexion to 120 degrees.  External rotation of approximately 30 degrees.  Is warm and well-perfused.   IMAGING: I personally ordered and reviewed the following images:  No new imaging obtained today.   Oliver Barre, MD 08/07/2023 8:31 AM

## 2023-08-20 ENCOUNTER — Telehealth: Payer: Self-pay | Admitting: Orthopedic Surgery

## 2023-08-20 MED ORDER — TRAMADOL HCL 50 MG PO TABS: 50.0000 mg | ORAL_TABLET | Freq: Two times a day (BID) | ORAL | 0 refills | Status: DC | PRN

## 2023-08-20 NOTE — Telephone Encounter (Signed)
Dr. Dallas Schimke Colleen Acevedo - Colleen Acevedo lvm stating she was here recently and that she got injections.  She stated that it's not helping and in fact it hurts worse now, all the way down her arms.  She would like a script for pain.  [phone]

## 2023-08-21 NOTE — Telephone Encounter (Signed)
Called and let pt know about rx sent and information from provider. Pt verbalized understanding.

## 2023-08-27 ENCOUNTER — Telehealth: Payer: Self-pay | Admitting: Orthopedic Surgery

## 2023-08-27 NOTE — Telephone Encounter (Signed)
Dr. Dallas Schimke pt - pt lvm stating that she got two injections on 08/06/23, hands were swelling, shoulders still ache.  She has been taking the Tramadol and it's not working.  She stating she's hurting more now than before.  She would like to know what the next step is.  She would like a call back.

## 2023-09-02 NOTE — Telephone Encounter (Signed)
Left detailed message with call back # incase of questions.

## 2023-09-03 DIAGNOSIS — R6889 Other general symptoms and signs: Secondary | ICD-10-CM | POA: Diagnosis not present

## 2023-09-03 DIAGNOSIS — N390 Urinary tract infection, site not specified: Secondary | ICD-10-CM | POA: Diagnosis not present

## 2023-09-03 DIAGNOSIS — N3281 Overactive bladder: Secondary | ICD-10-CM | POA: Diagnosis not present

## 2023-09-03 DIAGNOSIS — Z90710 Acquired absence of both cervix and uterus: Secondary | ICD-10-CM | POA: Diagnosis not present

## 2023-09-03 DIAGNOSIS — Z4689 Encounter for fitting and adjustment of other specified devices: Secondary | ICD-10-CM | POA: Diagnosis not present

## 2023-09-03 DIAGNOSIS — N952 Postmenopausal atrophic vaginitis: Secondary | ICD-10-CM | POA: Diagnosis not present

## 2023-09-10 ENCOUNTER — Encounter: Payer: Self-pay | Admitting: Family Medicine

## 2023-09-10 ENCOUNTER — Telehealth: Payer: Self-pay

## 2023-09-10 ENCOUNTER — Ambulatory Visit (INDEPENDENT_AMBULATORY_CARE_PROVIDER_SITE_OTHER): Payer: Medicare HMO | Admitting: Family Medicine

## 2023-09-10 VITALS — BP 89/65 | HR 68 | Ht 62.0 in | Wt 127.0 lb

## 2023-09-10 DIAGNOSIS — N390 Urinary tract infection, site not specified: Secondary | ICD-10-CM

## 2023-09-10 DIAGNOSIS — R109 Unspecified abdominal pain: Secondary | ICD-10-CM | POA: Diagnosis not present

## 2023-09-10 MED ORDER — NITROFURANTOIN MONOHYD MACRO 100 MG PO CAPS: 100.0000 mg | ORAL_CAPSULE | Freq: Two times a day (BID) | ORAL | 0 refills | Status: AC

## 2023-09-10 NOTE — Assessment & Plan Note (Addendum)
Macrobid 100 mg x 5 days US renal ordered due to flank pain Urinalysis, NuSwab and urine culture ordered- Awaiting results will follow up. May take OTC AZO for urinary pain relief. Discussed managing a UTI at home, it's important to drink plenty of water to help flush out bacteria from your urinary tract. Make sure to urinate frequently and avoid holding your urine. After using the bathroom, always wipe from front to back to prevent bacteria from spreading. Avoid irritants like caffeine, alcohol, spicy foods, and artificial sweeteners, as they can aggravate your bladder. Wearing loose, breathable clothing, especially cotton underwear, can help keep the area dry and reduce bacterial growth. If symptoms persist or worsen follow up.

## 2023-09-10 NOTE — Patient Instructions (Signed)

## 2023-09-10 NOTE — Telephone Encounter (Signed)
Patient left message stating she needed to get an appointment with Dr. Dallas Schimke about her shoulders hurting her. I returned her call and had to leave a message for her to call the office.

## 2023-09-10 NOTE — Progress Notes (Signed)
Patient Office Visit   Subjective   Patient ID: Colleen Acevedo, female    DOB: [birth year]  Age: 77 y.o. MRN: [id number]  CC:  Chief Complaint  Patient presents with   Urinary Tract Infection    Frequent discharge, frequent urination, exhaustion, and body pain.      HPI Colleen Acevedo 77 year old female, presents to the clinic for recurrent UTI. She  has a past medical history of Anxiety disorder, Mixed hyperlipidemia, Parkinson's disease (HCC), and Urinary incontinence.  Urinary Tract Infection: The patient presents with a recurrent issue of urinary tract infections, with symptoms occurring during every urination and gradually worsening over time. Despite this, the patient reports no pain or fever. Associated symptoms include vaginal discharge, flank pain, and increased urinary frequency. Notably, the patient denies chills or urgency. The discharge is described as tan in color. She has attempted to manage the symptoms by increasing fluid intake, but this has provided no relief. Her medical history is significant for recurrent UTIs, but there is no history of kidney stones.      Outpatient Encounter Medications as of 09/10/2023  Medication Sig   acetaminophen (TYLENOL) 500 MG tablet Take 1,000 mg by mouth daily with breakfast.   Calcium Carbonate-Vitamin D (CALCIUM 500 + D) 500-125 MG-UNIT TABS Take by mouth.   carbidopa-levodopa (SINEMET IR) 25-100 MG tablet Take 2 tablets by mouth 4 (four) times daily.   cetirizine (ZYRTEC ALLERGY) 10 MG tablet Take 1 tablet (10 mg total) by mouth daily.   CRANBERRY PO Take by mouth.   estradiol (ESTRACE) 0.1 MG/GM vaginal cream Place 1 Applicatorful vaginally. Every other day   fesoterodine (TOVIAZ) 8 MG TB24 tablet Take 1 tablet (8 mg total) by mouth daily.   fluticasone (FLONASE) 50 MCG/ACT nasal spray Place 2 sprays into both nostrils daily.   guaiFENesin (ROBITUSSIN) 100 MG/5ML liquid Take 5 mLs by mouth every 4 (four) hours as needed  for cough or to loosen phlegm.   ibuprofen (ADVIL) 200 MG tablet Take 400 mg by mouth at bedtime.   Lactobacillus (ACIDOPHILUS) 100 MG CAPS Take by mouth.   nitrofurantoin, macrocrystal-monohydrate, (MACROBID) 100 MG capsule Take 1 capsule (100 mg total) by mouth 2 (two) times daily for 5 days.   Omega-3 Fatty Acids (FISH OIL) 500 MG CAPS Take by mouth.   omeprazole (PRILOSEC) 40 MG capsule TAKE 1 CAPSULE EVERY DAY   OVER THE COUNTER MEDICATION Velbet El energy antler 250 mg   Probiotic Product (PROBIOTIC ADVANCED PO) Take by mouth.   sertraline (ZOLOFT) 25 MG tablet TAKE 2 TABLETS EVERY DAY   simvastatin (ZOCOR) 40 MG tablet TAKE 1 TABLET EVERY DAY   trimethoprim-polymyxin b (POLYTRIM) ophthalmic solution Apply 1 to 2 drops 4 times daily for 7 days   vitamin B-12 (CYANOCOBALAMIN) 100 MCG tablet Take 100 mcg by mouth daily.   traMADol (ULTRAM) 50 MG tablet Take 1 tablet (50 mg total) by mouth every 12 (twelve) hours as needed. (Patient not taking: Reported on 09/10/2023)   No facility-administered encounter medications on file as of 09/10/2023.    Past Surgical History:  Procedure Laterality Date   BACK SURGERY     BREAST BIOPSY Right    KNEE SURGERY     VAGINAL HYSTERECTOMY      Review of Systems  Constitutional:  Negative for fever.  Eyes:  Negative for blurred vision.  Respiratory:  Negative for shortness of breath.   Cardiovascular:  Negative for chest pain.  Gastrointestinal:  Negative for  abdominal pain.  Genitourinary:  Positive for flank pain, frequency and urgency. Negative for dysuria and hematuria.  Musculoskeletal:  Negative for myalgias.  Neurological:  Negative for dizziness and headaches.      Objective    BP (!) 89/65   Pulse 68   Ht 5\' 2"  (1.575 m)   Wt 127 lb (57.6 kg)   SpO2 93%   BMI 23.23 kg/m   Physical Exam Vitals reviewed.  Constitutional:      General: She is not in acute distress.    Appearance: Normal appearance. She is not ill-appearing,  toxic-appearing or diaphoretic.  HENT:     Head: Normocephalic.  Eyes:     General:        Right eye: No discharge.        Left eye: No discharge.     Conjunctiva/sclera: Conjunctivae normal.  Cardiovascular:     Rate and Rhythm: Normal rate.     Pulses: Normal pulses.     Heart sounds: Normal heart sounds.  Pulmonary:     Effort: Pulmonary effort is normal. No respiratory distress.     Breath sounds: Normal breath sounds.  Abdominal:     General: Bowel sounds are normal.     Palpations: Abdomen is soft.     Tenderness: There is no abdominal tenderness. There is no right CVA tenderness, left CVA tenderness or guarding.  Musculoskeletal:     Cervical back: Normal range of motion.  Skin:    General: Skin is warm and dry.     Capillary Refill: Capillary refill takes less than 2 seconds.  Neurological:     Mental Status: She is alert.  Psychiatric:        Mood and Affect: Mood normal.        Behavior: Behavior normal.       Assessment & Plan:  Recurrent UTI Assessment & Plan: Macrobid 100 mg x 5 days US renal ordered due to flank pain Urinalysis, NuSwab and urine culture ordered- Awaiting results will follow up. May take OTC AZO for urinary pain relief. Discussed managing a UTI at home, it's important to drink plenty of water to help flush out bacteria from your urinary tract. Make sure to urinate frequently and avoid holding your urine. After using the bathroom, always wipe from front to back to prevent bacteria from spreading. Avoid irritants like caffeine, alcohol, spicy foods, and artificial sweeteners, as they can aggravate your bladder. Wearing loose, breathable clothing, especially cotton underwear, can help keep the area dry and reduce bacterial growth. If symptoms persist or worsen follow up.   Orders: -     Urine Culture -     Urinalysis -     NuSwab Vaginitis Plus (VG+)  Flank pain -     US RENAL; Future  Other orders -     Nitrofurantoin Monohyd Macro; Take 1  capsule (100 mg total) by mouth 2 (two) times daily for 5 days.  Dispense: 10 capsule; Refill: 0    Return if symptoms worsen or fail to improve.   Cruzita Lederer Newman Nip, FNP

## 2023-09-11 DIAGNOSIS — N390 Urinary tract infection, site not specified: Secondary | ICD-10-CM | POA: Diagnosis not present

## 2023-09-12 ENCOUNTER — Ambulatory Visit: Payer: Medicare HMO | Admitting: Family Medicine

## 2023-09-12 LAB — URINALYSIS
Bilirubin, UA: NEGATIVE
Glucose, UA: NEGATIVE
Ketones, UA: NEGATIVE
Nitrite, UA: NEGATIVE
Protein,UA: NEGATIVE
Specific Gravity, UA: 1.01 (ref 1.005–1.030)
Urobilinogen, Ur: 0.2 mg/dL (ref 0.2–1.0)
pH, UA: 6 (ref 5.0–7.5)

## 2023-09-12 LAB — NUSWAB VAGINITIS PLUS (VG+)
Candida albicans, NAA: NEGATIVE
Candida glabrata, NAA: NEGATIVE
Chlamydia trachomatis, NAA: NEGATIVE
Neisseria gonorrhoeae, NAA: NEGATIVE
Trich vag by NAA: NEGATIVE

## 2023-09-16 ENCOUNTER — Ambulatory Visit (HOSPITAL_COMMUNITY): Payer: Medicare HMO

## 2023-09-18 ENCOUNTER — Ambulatory Visit (HOSPITAL_COMMUNITY)
Admission: RE | Admit: 2023-09-18 | Discharge: 2023-09-18 | Disposition: A | Discharge status: Home / Self Care | Payer: Medicare HMO | Source: Ambulatory Visit | Attending: Family Medicine | Admitting: Family Medicine

## 2023-09-18 DIAGNOSIS — R6889 Other general symptoms and signs: Secondary | ICD-10-CM | POA: Diagnosis not present

## 2023-09-18 DIAGNOSIS — R109 Unspecified abdominal pain: Secondary | ICD-10-CM | POA: Diagnosis not present

## 2023-09-19 ENCOUNTER — Encounter: Payer: Self-pay | Admitting: Orthopedic Surgery

## 2023-09-19 ENCOUNTER — Ambulatory Visit (INDEPENDENT_AMBULATORY_CARE_PROVIDER_SITE_OTHER): Payer: Medicare HMO | Admitting: Orthopedic Surgery

## 2023-09-19 VITALS — BP 107/60 | HR 84 | Ht 62.0 in | Wt 128.0 lb

## 2023-09-19 DIAGNOSIS — M19012 Primary osteoarthritis, left shoulder: Secondary | ICD-10-CM

## 2023-09-19 DIAGNOSIS — M19011 Primary osteoarthritis, right shoulder: Secondary | ICD-10-CM | POA: Diagnosis not present

## 2023-09-19 NOTE — Patient Instructions (Signed)

## 2023-09-19 NOTE — Progress Notes (Signed)
Orthopaedic Clinic Return  Assessment: Colleen Acevedo is a 77 y.o. female with the following: Right glenohumeral arthritis Left glenohumeral arthritis  Plan: [name] glenohumeral joint arthritis in bilateral shoulders.  She continues to have significant pain in bilateral shoulders.  Most recent ultrasound-guided injections were not as effective as first round of injections.  He is started develop pain and irritation in her neck, primarily due to accommodation.  She has previously had good results with subacromial steroid injections.  These were completed in clinic today.  We briefly discussed shoulder replacement, but this would be a consideration given her medical comorbidities, specifically her Parkinson's.  Nonetheless, it is possible, we can discuss this in more detail.  If her pain improves, and she would like to try some therapy, I think it is reasonable.  I am reluctant to send her to therapy immediately, as this could cause further irritation.   Procedure note injection - Right shoulder    Verbal consent was obtained to inject the right shoulder, subacromial space Timeout was completed to confirm the site of injection.   The skin was prepped with alcohol and ethyl chloride was sprayed at the injection site.  A 21-gauge needle was used to inject 40 mg of Depo-Medrol and 1% lidocaine (4 cc) into the subacromial space of the right shoulder using a posterolateral approach.  There were no complications.  A sterile bandage was applied.    Procedure note injection Left shoulder    Verbal consent was obtained to inject the left shoulder, subacromial space Timeout was completed to confirm the site of injection.  The skin was prepped with alcohol and ethyl chloride was sprayed at the injection site.  A 21-gauge needle was used to inject 40 mg of Depo-Medrol and 1% lidocaine (4 cc) into the subacromial space of the left shoulder using a posterolateral approach.  There were no  complications. A sterile bandage was applied.    Follow-up: Return if symptoms worsen or fail to improve.   Subjective:  Chief Complaint  Patient presents with   Shoulder Pain    Bilat shoulder pain. Pt received bilat injection 08/06/23 called shortly after stating injections didn't help tramadol was sent in. Pt states she has noticed swelling in her right hand and she doesn't want to move her arms.    History of Present Illness: Colleen Acevedo is a 77 y.o. female who returns to clinic for evaluation of bilateral shoulder pain.  I saw her in clinic a little over a month ago, at that time we completed bilateral SOUND guided injections in the glenohumeral joints.  She had some improvement in her symptoms, but since then the pain is progressively worsened.  According to her daughter, she is very rigid and not using her shoulders.  Is difficult for her to lift her arms above the level of her shoulder.  She is struggling with her activities of daily living.  This is affecting her overall health, and her daughter states that she is acting depressed.  Review of Systems: No fevers or chills No numbness or tingling No chest pain No shortness of breath No bowel or bladder dysfunction No GI distress No headaches   Objective: BP 107/60   Pulse 84   Ht 5\' 2"  (1.575 m)   Wt 128 lb (58.1 kg)   BMI 23.41 kg/m   Physical Exam:  Alert and oriented.  No acute distress.  Active motion intact in her right hand.  No deformity about the right shoulder.  2+  radial pulse.  Passive forward flexion to 110 degrees before it is uncomfortable.  External rotation is limited compared to the contralateral side.  No deformity of the left shoulder.  Sensation intact in the left hand.  Passive forward flexion to 120 degrees.  External rotation of approximately 30 degrees.  Is warm and well-perfused.   IMAGING: I personally ordered and reviewed the following images:  No new imaging obtained  today.   Oliver Barre, MD 09/19/2023 9:58 AM

## 2023-09-22 ENCOUNTER — Other Ambulatory Visit: Payer: Self-pay | Admitting: Nurse Practitioner

## 2023-09-22 DIAGNOSIS — K219 Gastro-esophageal reflux disease without esophagitis: Secondary | ICD-10-CM

## 2023-10-08 ENCOUNTER — Telehealth: Payer: Self-pay | Admitting: Family Medicine

## 2023-10-08 NOTE — Telephone Encounter (Signed)
Copied from CRM [id number]. Topic: Clinical - Lab/Test Results >> Oct 08, 2023  9:47 AM Raven B wrote: Reason for CRM: PT wants ultrasound results. Call back # [phone]

## 2023-10-09 NOTE — Telephone Encounter (Signed)
Results still pending.

## 2023-10-12 ENCOUNTER — Other Ambulatory Visit: Payer: Self-pay | Admitting: Family Medicine

## 2023-10-12 DIAGNOSIS — R1084 Generalized abdominal pain: Secondary | ICD-10-CM

## 2023-10-12 NOTE — Progress Notes (Signed)
Please inform patient,   The ultrasound of your kidneys showed some unusual findings. There are areas in both kidneys that look like fluid-filled spaces. These could be due to a condition where the kidneys are holding onto too much fluid, called hydronephrosis, or they could be cysts, which are like small sacs filled with fluid. It might even be a combination of both.  Because the ultrasound couldn't give [place] a clear answer, the next step is to do a more detailed imaging test called a CT scan. This will give [place] a clearer picture of what's going on inside your kidneys and help [place] understand the cause of your flank pain. The CT scan will use a special dye to highlight the structures in your kidneys, and this will help [place] decide the best way to treat it moving forward.

## 2023-10-15 ENCOUNTER — Telehealth: Payer: Self-pay

## 2023-10-15 NOTE — Telephone Encounter (Signed)
Requesting a return call for lab results

## 2023-10-15 NOTE — Telephone Encounter (Signed)
Pt has been informed.

## 2023-10-29 ENCOUNTER — Ambulatory Visit (HOSPITAL_COMMUNITY)
Admission: RE | Admit: 2023-10-29 | Discharge: 2023-10-29 | Disposition: A | Discharge status: Home / Self Care | Payer: Medicare HMO | Source: Ambulatory Visit | Attending: Family Medicine | Admitting: Family Medicine

## 2023-10-29 DIAGNOSIS — K5730 Diverticulosis of large intestine without perforation or abscess without bleeding: Secondary | ICD-10-CM | POA: Diagnosis not present

## 2023-10-29 DIAGNOSIS — N281 Cyst of kidney, acquired: Secondary | ICD-10-CM | POA: Diagnosis not present

## 2023-10-29 DIAGNOSIS — R1084 Generalized abdominal pain: Secondary | ICD-10-CM | POA: Insufficient documentation

## 2023-10-29 DIAGNOSIS — R109 Unspecified abdominal pain: Secondary | ICD-10-CM | POA: Diagnosis not present

## 2023-10-29 DIAGNOSIS — K449 Diaphragmatic hernia without obstruction or gangrene: Secondary | ICD-10-CM | POA: Diagnosis not present

## 2023-10-29 DIAGNOSIS — R6889 Other general symptoms and signs: Secondary | ICD-10-CM | POA: Diagnosis not present

## 2023-10-29 MED ORDER — IOHEXOL 300 MG/ML  SOLN
100.0000 mL | Freq: Once | INTRAMUSCULAR | Status: AC | PRN
  Administered 2023-10-29: 100 mL via INTRAVENOUS

## 2023-10-30 ENCOUNTER — Telehealth: Payer: Self-pay | Admitting: Orthopedic Surgery

## 2023-10-30 NOTE — Telephone Encounter (Signed)
Dr. Dallas Schimke pt - pt lvm stating she would like an order for PT and would like to have it at her house because she doesn't drive.  [phone]

## 2023-11-03 NOTE — Progress Notes (Signed)
Please inform patient  Abdominal CT results showed:  Cysts in the liver, spleen, and kidneys: These are fluid-filled sacs that are usually harmless. No follow-up imaging is needed unless symptoms develop.  Hiatal hernia: Part of your stomach pushes into your chest. It might cause heartburn or reflux.  Consider avoiding large meals, staying upright after eating, and medications like antacids if you have symptoms.  Aortic atherosclerosis: Cholesterol buildup in your main artery. Focus on heart health with diet, avoid or limit fatty foods, maintain exercise routine 30 minutes x 5 per week  Diverticulosis in the colon: Small pouches in your colon. Eat a high-fiber diet and stay hydrated to avoid issues.

## 2023-11-11 DIAGNOSIS — E559 Vitamin D deficiency, unspecified: Secondary | ICD-10-CM | POA: Diagnosis not present

## 2023-11-11 DIAGNOSIS — E7849 Other hyperlipidemia: Secondary | ICD-10-CM | POA: Diagnosis not present

## 2023-11-11 DIAGNOSIS — R7301 Impaired fasting glucose: Secondary | ICD-10-CM | POA: Diagnosis not present

## 2023-11-11 DIAGNOSIS — E038 Other specified hypothyroidism: Secondary | ICD-10-CM | POA: Diagnosis not present

## 2023-11-12 ENCOUNTER — Ambulatory Visit: Payer: Medicare HMO | Admitting: Family Medicine

## 2023-11-12 LAB — CMP14+EGFR
ALT: 3 [IU]/L (ref 0–32)
AST: 9 [IU]/L (ref 0–40)
Albumin: 3.4 g/dL — ABNORMAL LOW (ref 3.8–4.8)
Alkaline Phosphatase: 116 [IU]/L (ref 44–121)
BUN/Creatinine Ratio: 46 — ABNORMAL HIGH (ref 12–28)
BUN: 25 mg/dL (ref 8–27)
Bilirubin Total: 0.3 mg/dL (ref 0.0–1.2)
CO2: 23 mmol/L (ref 20–29)
Calcium: 8.9 mg/dL (ref 8.7–10.3)
Chloride: 102 mmol/L (ref 96–106)
Creatinine, Ser: 0.54 mg/dL — ABNORMAL LOW (ref 0.57–1.00)
Globulin, Total: 2.8 g/dL (ref 1.5–4.5)
Glucose: 102 mg/dL — ABNORMAL HIGH (ref 70–99)
Potassium: 4.3 mmol/L (ref 3.5–5.2)
Sodium: 140 mmol/L (ref 134–144)
Total Protein: 6.2 g/dL (ref 6.0–8.5)
eGFR: 94 mL/min/{1.73_m2} (ref >59)

## 2023-11-12 LAB — LIPID PANEL
Chol/HDL Ratio: 2.4 {ratio} (ref 0.0–4.4)
Cholesterol, Total: 114 mg/dL (ref 100–199)
HDL: 48 mg/dL (ref >39)
LDL Chol Calc (NIH): 52 mg/dL (ref 0–99)
Triglycerides: 67 mg/dL (ref 0–149)
VLDL Cholesterol Cal: 14 mg/dL (ref 5–40)

## 2023-11-12 LAB — CBC WITH DIFFERENTIAL/PLATELET
Basophils Absolute: 0 10*3/uL (ref 0.0–0.2)
Basos: 0 %
EOS (ABSOLUTE): 0.1 10*3/uL (ref 0.0–0.4)
Eos: 1 %
Hematocrit: 31.9 % — ABNORMAL LOW (ref 34.0–46.6)
Hemoglobin: 10 g/dL — ABNORMAL LOW (ref 11.1–15.9)
Immature Grans (Abs): 0 10*3/uL (ref 0.0–0.1)
Immature Granulocytes: 1 %
Lymphocytes Absolute: 1 10*3/uL (ref 0.7–3.1)
Lymphs: 12 %
MCH: 25.9 pg — ABNORMAL LOW (ref 26.6–33.0)
MCHC: 31.3 g/dL — ABNORMAL LOW (ref 31.5–35.7)
MCV: 83 fL (ref 79–97)
Monocytes Absolute: 0.8 10*3/uL (ref 0.1–0.9)
Monocytes: 10 %
Neutrophils Absolute: 6.2 10*3/uL (ref 1.4–7.0)
Neutrophils: 76 %
Platelets: 340 10*3/uL (ref 150–450)
RBC: 3.86 x10E6/uL (ref 3.77–5.28)
RDW: 14.3 % (ref 11.7–15.4)
WBC: 8.1 10*3/uL (ref 3.4–10.8)

## 2023-11-12 LAB — HEMOGLOBIN A1C
Est. average glucose Bld gHb Est-mCnc: 108 mg/dL
Hgb A1c MFr Bld: 5.4 % (ref 4.8–5.6)

## 2023-11-12 LAB — TSH+FREE T4
Free T4: 1.16 ng/dL (ref 0.82–1.77)
TSH: 2.18 u[IU]/mL (ref 0.450–4.500)

## 2023-11-12 LAB — VITAMIN D 25 HYDROXY (VIT D DEFICIENCY, FRACTURES): Vit D, 25-Hydroxy: 45.4 ng/mL (ref 30.0–100.0)

## 2023-11-14 ENCOUNTER — Ambulatory Visit: Payer: Self-pay | Admitting: Family Medicine

## 2023-11-22 ENCOUNTER — Telehealth: Payer: Self-pay | Admitting: Orthopedic Surgery

## 2023-11-22 NOTE — Telephone Encounter (Signed)
Dr. Dallas Schimke pt - pt's daughter Lucretia Roers [phone] lvm on 12/27 at 12:19pm stating that they are trying to setup home PT and OT.  She stated you can call her or call the patient at [phone].

## 2023-11-25 NOTE — Telephone Encounter (Signed)
Called and left VM to call back for questions or concerns.

## 2023-11-28 ENCOUNTER — Ambulatory Visit: Payer: Medicare HMO | Admitting: Family Medicine

## 2023-11-28 ENCOUNTER — Other Ambulatory Visit: Payer: Self-pay | Admitting: Neurology

## 2023-12-03 ENCOUNTER — Ambulatory Visit: Payer: Medicare HMO | Admitting: Family Medicine

## 2023-12-10 ENCOUNTER — Encounter: Payer: Self-pay | Admitting: Family Medicine

## 2023-12-18 ENCOUNTER — Telehealth: Payer: Self-pay | Admitting: Orthopedic Surgery

## 2023-12-18 DIAGNOSIS — M19011 Primary osteoarthritis, right shoulder: Secondary | ICD-10-CM

## 2023-12-18 DIAGNOSIS — G8929 Other chronic pain: Secondary | ICD-10-CM

## 2023-12-18 NOTE — Telephone Encounter (Signed)
Last seen 09/19/23, you stated to wait on PT/OT. Please advise.

## 2023-12-18 NOTE — Telephone Encounter (Signed)
Dr. Dallas Schimke pt - pt lvm stating she wants PT and OT at home bc she doesn't drive.

## 2023-12-24 NOTE — Telephone Encounter (Signed)
Dr. Dallas Schimke pt - spoke w/the pt this morning, she is checking on the status of OT/PT.

## 2023-12-24 NOTE — Telephone Encounter (Signed)
At office visit it was not ordered She was last here in October Needs appointment to see if OT PT may be helpful, or if it would be too much for her, can advise them appointment needed first

## 2023-12-24 NOTE — Telephone Encounter (Signed)
Called patient to let her know that she has not been seen since October and need a an appointment to discuss OT/PT  transferred her to the front

## 2023-12-29 ENCOUNTER — Ambulatory Visit (INDEPENDENT_AMBULATORY_CARE_PROVIDER_SITE_OTHER): Payer: Medicare HMO

## 2023-12-29 VITALS — Ht 62.5 in | Wt 128.0 lb

## 2023-12-29 DIAGNOSIS — Z0000 Encounter for general adult medical examination without abnormal findings: Secondary | ICD-10-CM | POA: Diagnosis not present

## 2023-12-29 NOTE — Progress Notes (Addendum)
Subjective:   Colleen Acevedo is a 78 y.o. female who presents for Medicare Annual (Subsequent) preventive examination.  Visit Complete: Virtual I connected with  [name] on 12/29/23 by a audio enabled telemedicine application and verified that I am speaking with the correct person using two identifiers.  Patient Location: Home  Provider Location: Office/Clinic  I discussed the limitations of evaluation and management by telemedicine. The patient expressed understanding and agreed to proceed.  Vital Signs: Because this visit was a virtual/telehealth visit, some criteria may be missing or patient reported. Any vitals not documented were not able to be obtained and vitals that have been documented are patient reported. Cardiac Risk Factors include: advanced age (>71men, >96 women);dyslipidemia    Objective:    Today's Vitals   12/29/23 1436 12/29/23 1437  Weight: 128 lb (58.1 kg)   Height: 5' 2.5" (1.588 m)   PainSc:  7    Body mass index is 23.04 kg/m.     12/29/2023    2:33 PM 12/18/2022    4:32 PM  Advanced Directives  Does Patient Have a Medical Advance Directive? Yes No  Type of Estate agent of [place];Living will   Copy of Healthcare Power of Attorney in Chart? No - copy requested   Would patient like information on creating a medical advance directive?  No - Patient declined    Current Medications (verified) Outpatient Encounter Medications as of 12/29/2023  Medication Sig   acetaminophen (TYLENOL) 500 MG tablet Take 1,000 mg by mouth daily with breakfast.   Calcium Carbonate-Vitamin D (CALCIUM 500 + D) 500-125 MG-UNIT TABS Take by mouth.   carbidopa-levodopa (SINEMET IR) 25-100 MG tablet TAKE 2 TABLETS FOUR TIMES DAILY   cetirizine (ZYRTEC ALLERGY) 10 MG tablet Take 1 tablet (10 mg total) by mouth daily.   CRANBERRY PO Take by mouth.   estradiol (ESTRACE) 0.1 MG/GM vaginal cream Place 1 Applicatorful vaginally. Every other day    fesoterodine (TOVIAZ) 8 MG TB24 tablet Take 1 tablet (8 mg total) by mouth daily.   fluticasone (FLONASE) 50 MCG/ACT nasal spray Place 2 sprays into both nostrils daily.   guaiFENesin (ROBITUSSIN) 100 MG/5ML liquid Take 5 mLs by mouth every 4 (four) hours as needed for cough or to loosen phlegm.   ibuprofen (ADVIL) 200 MG tablet Take 400 mg by mouth at bedtime.   Lactobacillus (ACIDOPHILUS) 100 MG CAPS Take by mouth.   Omega-3 Fatty Acids (FISH OIL) 500 MG CAPS Take by mouth.   omeprazole (PRILOSEC) 40 MG capsule TAKE 1 CAPSULE EVERY DAY   OVER THE COUNTER MEDICATION Velbet El energy antler 250 mg   Probiotic Product (PROBIOTIC ADVANCED PO) Take by mouth.   sertraline (ZOLOFT) 25 MG tablet TAKE 2 TABLETS EVERY DAY   simvastatin (ZOCOR) 40 MG tablet TAKE 1 TABLET EVERY DAY   trimethoprim-polymyxin b (POLYTRIM) ophthalmic solution Apply 1 to 2 drops 4 times daily for 7 days   vitamin B-12 (CYANOCOBALAMIN) 100 MCG tablet Take 100 mcg by mouth daily.   [DISCONTINUED] traMADol (ULTRAM) 50 MG tablet Take 1 tablet (50 mg total) by mouth every 12 (twelve) hours as needed.   No facility-administered encounter medications on file as of 12/29/2023.    Allergies (verified) Amoxicillin   History: Past Medical History:  Diagnosis Date   Anxiety disorder    Mixed hyperlipidemia    Parkinson's disease (HCC)    Urinary incontinence    uses pessary   Past Surgical History:  Procedure Laterality Date  BACK SURGERY     BREAST BIOPSY Right    KNEE SURGERY     VAGINAL HYSTERECTOMY     Family History  Problem Relation Age of Onset   Colon cancer Father    Diabetes Mother    Heart attack Brother    Colon cancer Sister    Breast cancer Sister    Heart attack Brother    Thyroid disease Daughter    Social History   Socioeconomic History   Marital status: Divorced    Spouse name: Not on file   Number of children: 2   Years of education: Not on file   Highest education level: Not on file   Occupational History    Comment: retired  Tobacco Use   Smoking status: Never   Smokeless tobacco: Never  Vaping Use   Vaping status: Never Used  Substance and Sexual Activity   Alcohol use: Never   Drug use: Never   Sexual activity: Not Currently    Birth control/protection: Surgical    Comment: hyst  Other Topics Concern   Not on file  Social History Narrative   Lives at home alone   Right handed   Caffeine: occasional   Social Drivers of Health   Financial Resource Strain: Low Risk  (12/29/2023)   Overall Financial Resource Strain (CARDIA)    Difficulty of Paying Living Expenses: Not hard at all  Food Insecurity: No Food Insecurity (12/29/2023)   Hunger Vital Sign    Worried About Running Out of Food in the Last Year: Never true    Ran Out of Food in the Last Year: Never true  Transportation Needs: Unmet Transportation Needs (12/29/2023)   PRAPARE - Administrator, Civil Service (Medical): Yes    Lack of Transportation (Non-Medical): No  Physical Activity: Insufficiently Active (12/29/2023)   Exercise Vital Sign    Days of Exercise per Week: 7 days    Minutes of Exercise per Session: 20 min  Stress: No Stress Concern Present (12/29/2023)   [employer] of Occupational Health - Occupational Stress Questionnaire    Feeling of Stress : Not at all  Social Connections: Moderately Isolated (12/29/2023)   Social Connection and Isolation Panel [NHANES]    Frequency of Communication with Friends and Family: More than three times a week    Frequency of Social Gatherings with Friends and Family: More than three times a week    Attends Religious Services: 1 to 4 times per year    Active Member of [employer] or Organizations: No    Attends Engineer, structural: Never    Marital Status: Divorced    Tobacco Counseling Counseling given: Not Answered   Clinical Intake:  Pre-visit preparation completed: Yes  Pain : 0-10 Pain Score: 7  Pain Type: Acute pain Pain  Location: Shoulder (both shoulders) Pain Orientation: Right, Left Pain Descriptors / Indicators: Aching Pain Onset: In the past 7 days Pain Frequency: Occasional     BMI - recorded: 23.04 Nutritional Status: BMI of 19-24  Normal Diabetes: No  How often do you need to have someone help you when you read instructions, pamphlets, or other written materials from your doctor or pharmacy?: 1 - Never  Interpreter Needed?: No  Information entered by :: Hassell Halim, CMA   Activities of Daily Living    12/29/2023    2:42 PM  In your present state of health, do you have any difficulty performing the following activities:  Hearing? 1  Comment sometimes -  no referral needed  Vision? 0  Difficulty concentrating or making decisions? 0  Walking or climbing stairs? 0  Dressing or bathing? 0  Doing errands, shopping? 0  Preparing Food and eating ? N  Using the Toilet? N  In the past six months, have you accidently leaked urine? Y  Comment wears depends - sees a Urologist  Do you have problems with loss of bowel control? N  Managing your Medications? N  Managing your Finances? N  Housekeeping or managing your Housekeeping? N    Patient Care Team: Gilmore Laroche, FNP as PCP - General (Family Medicine)  Indicate any recent Medical Services you may have received from other than [facility] providers in the past year (date may be approximate).     Assessment:   This is a routine wellness examination for Rayleen.  Hearing/Vision screen Hearing Screening - Comments:: Sometimes - no referral needed Vision Screening - Comments:: Wears rx glasses - up to date with routine eye exams with  [facility]   Goals Addressed               This Visit's Progress     Patient Stated (pt-stated)        Patient plans to remain active       Depression Screen    12/29/2023    2:51 PM 07/16/2023    9:57 AM 05/14/2023    3:33 PM 04/09/2023    9:02 AM 02/26/2023    9:57 AM 01/08/2023    9:20 AM  12/18/2022    4:31 PM  PHQ 2/9 Scores  PHQ - 2 Score 0 0 0 0 0 0 0  PHQ- 9 Score  0 0 0 0 0 0    Fall Risk    12/29/2023    2:44 PM 05/14/2023    3:33 PM 04/09/2023    9:02 AM 02/26/2023    9:57 AM 01/08/2023    9:19 AM  Fall Risk   Falls in the past year? 0 0 0 1 0  Number falls in past yr: 0 0 0 0 0  Injury with Fall? 0 0 0 1 0  Risk for fall due to : No Fall Risks  No Fall Risks History of fall(s) No Fall Risks  Follow up Falls prevention discussed  Falls evaluation completed Falls evaluation completed Falls evaluation completed    MEDICARE RISK AT HOME: Medicare Risk at Home Any stairs in or around the home?: Yes If so, are there any without handrails?: No Home free of loose throw rugs in walkways, pet beds, electrical cords, etc?: Yes Adequate lighting in your home to reduce risk of falls?: Yes Life alert?: No Use of a cane, walker or w/c?: No Grab bars in the bathroom?: Yes Shower chair or bench in shower?: Yes Elevated toilet seat or a handicapped toilet?: Yes  TIMED UP AND GO:  Was the test performed?  No    Cognitive Function:        12/29/2023    2:45 PM 12/18/2022    4:34 PM 08/21/2021    3:54 PM  6CIT Screen  What Year? 0 points 0 points 0 points  What month? 0 points 0 points 0 points  What time? 0 points 0 points 0 points  Count back from 20 0 points 0 points 0 points  Months in reverse 2 points 0 points 0 points  Repeat phrase 0 points 0 points 0 points  Total Score 2 points 0 points 0 points  Immunizations Immunization History  Administered Date(s) Administered   Fluad Quad(high Dose 65+) 09/03/2022   Influenza Split 09/04/2021   Influenza, High Dose Seasonal PF 09/24/2019   Influenza-Unspecified 09/27/2020   Moderna SARS-COV2 Booster Vaccination 05/10/2021, 10/11/2021   Moderna Sars-Covid-2 Vaccination 12/07/2019, 01/17/2020, 09/18/2020   PNEUMOCOCCAL CONJUGATE-20 09/03/2022    TDAP status: Due, Education has been provided regarding the  importance of this vaccine. Advised may receive this vaccine at local pharmacy or Health Dept. Aware to provide a copy of the vaccination record if obtained from local pharmacy or Health Dept. Verbalized acceptance and understanding.  Flu Vaccine status: Due, Education has been provided regarding the importance of this vaccine. Advised may receive this vaccine at local pharmacy or Health Dept. Aware to provide a copy of the vaccination record if obtained from local pharmacy or Health Dept. Verbalized acceptance and understanding.  Pneumococcal vaccine status: Up to date 09/03/22  Covid-19 vaccine status: Information provided on how to obtain vaccines.   Qualifies for Shingles Vaccine? Yes   Zostavax completed Yes   Shingrix Completed?: No.    Education has been provided regarding the importance of this vaccine. Patient has been advised to call insurance company to determine out of pocket expense if they have not yet received this vaccine. Advised may also receive vaccine at local pharmacy or Health Dept. Verbalized acceptance and understanding.  Screening Tests Health Maintenance  Topic Date Due   DTaP/Tdap/Td (1 - Tdap) Never done   Zoster Vaccines- Shingrix (1 of 2) Never done   INFLUENZA VACCINE  06/26/2023   COVID-19 Vaccine (6 - 2024-25 season) 07/27/2023   Medicare Annual Wellness (AWV)  12/28/2024   Pneumonia Vaccine 32+ Years old  Completed   DEXA SCAN  Completed   Hepatitis C Screening  Completed   HPV VACCINES  Aged Out    Health Maintenance  Health Maintenance Due  Topic Date Due   DTaP/Tdap/Td (1 - Tdap) Never done   Zoster Vaccines- Shingrix (1 of 2) Never done   INFLUENZA VACCINE  06/26/2023   COVID-19 Vaccine (6 - 2024-25 season) 07/27/2023    Colorectal cancer screening: No longer required.   Mammogram status: No longer required due to age [age].  Pt declines anymore.  Bone Density status: Completed 04/16/21. Results reflect: Bone density results: OSTEOPENIA.  Repeat every 2 years.   Additional Screening:  Hepatitis C Screening: does qualify; Completed 12/07/20  Vision Screening: Recommended annual ophthalmology exams for early detection of glaucoma and other disorders of the eye. Is the patient up to date with their annual eye exam?  Yes  Who is the provider or what is the name of the office in which the patient attends annual eye exams? [facility] If pt is not established with a provider, would they like to be referred to a provider to establish care? No .   Dental Screening: Recommended annual dental exams for proper oral hygiene   Community Resource Referral / Chronic Care Management: CRR required this visit?  No   CCM required this visit?  No     Plan:     I have personally reviewed and noted the following in the patient's chart:   Medical and social history Use of alcohol, tobacco or illicit drugs  Current medications and supplements including opioid prescriptions. Patient is not currently taking opioid prescriptions. Functional ability and status Nutritional status Physical activity Advanced directives List of other physicians Hospitalizations, surgeries, and ER visits in previous 12 months Vitals Screenings to  include cognitive, depression, and falls Referrals and appointments  In addition, I have reviewed and discussed with patient certain preventive protocols, quality metrics, and best practice recommendations. A written personalized care plan for preventive services as well as general preventive health recommendations were provided to patient.     Darreld Mclean, CMA   12/29/2023   After Visit Summary: (MyChart) Due to this being a telephonic visit, the after visit summary with patients personalized plan was offered to patient via MyChart   Nurse Notes: none

## 2023-12-29 NOTE — Patient Instructions (Addendum)
Colleen Acevedo , Thank you for taking time to come for your Medicare Wellness Visit. I appreciate your ongoing commitment to your health goals. Please review the following plan we discussed and let me know if I can assist you in the future.   Referrals/Orders/Follow-Ups/Clinician Recommendations: Aim for 30 minutes of exercise or brisk walking, 6-8 glasses of water, and 5 servings of fruits and vegetables each day.   This is a list of the screening recommended for you and due dates:  Health Maintenance  Topic Date Due   DTaP/Tdap/Td vaccine (1 - Tdap) Never done   Zoster (Shingles) Vaccine (1 of 2) Never done   Flu Shot  06/26/2023   COVID-19 Vaccine (6 - 2024-25 season) 07/27/2023   Medicare Annual Wellness Visit  12/19/2023   Pneumonia Vaccine  Completed   DEXA scan (bone density measurement)  Completed   Hepatitis C Screening  Completed   HPV Vaccine  Aged Out    Advanced directives: (Copy Requested) Please bring a copy of your health care power of attorney and living will to the office to be added to your chart at your convenience.  Next Medicare Annual Wellness Visit scheduled for next year: Yes 12/29/2024 at 2:30p

## 2023-12-30 ENCOUNTER — Ambulatory Visit: Payer: Medicare HMO | Admitting: Orthopedic Surgery

## 2023-12-31 NOTE — Addendum Note (Signed)
 Addended by: Marti Slates on: 12/31/2023 08:09 AM   Modules accepted: Orders

## 2024-01-02 ENCOUNTER — Ambulatory Visit: Payer: Medicare HMO | Admitting: Orthopedic Surgery

## 2024-01-07 ENCOUNTER — Ambulatory Visit (HOSPITAL_COMMUNITY): Payer: Medicare HMO | Admitting: Occupational Therapy

## 2024-01-07 NOTE — Progress Notes (Unsigned)
No chief complaint on file.   HISTORY OF PRESENT ILLNESS:  01/07/24 ALL: Colleen Acevedo returns for follow up for PD. She was last seen by me 04/2023 and doing well on carb-levo 2 tablets QID.   05/07/2023 ALL:  Colleen Acevedo is a 78 y.o. female here today for follow up for PD. She was last seen by Dr Frances Furbish 09/2022 and doing fairly well. She continues generic Sinemet 2 tablets QID and tolerates it well. She reports doing well. She presents, alone, today. She states tremor is stable. She may notice some worsening if she is nervous. Gait is stable. No falls. No assistive devices used. She reports sleeping well. She feels appetite is good. No trouble swallowing. She continues to babysit her grandchildren and reports being active daily. She does not drive.   HISTORY (copied from Dr Teofilo Pod previous note)  Colleen Acevedo is a 78 year old right-handed woman with an underlying medical history of hyperlipidemia, recurrent UTIs, left knee arthritis, and anxiety, who presents for follow-up consultation of her Parkinson's disease.  The patient is accompanied by her son-in-law, [name], today. I last saw her on 04/24/2022, at which time she reported feeling fairly stable, as of March 2023 she had been divorced, as of mid May 2023 she was able to move back into her home.  Her husband got the car.  She saw orthopedics on 11/14/2021 for left knee arthritis.  She was encouraged to wean off of Lyrica.  She received a steroid injection into the left knee.  In mid February 2023 she sustained a nondisplaced medial ankle fracture and saw orthopedics again.  She was treated with a walking boot.     Today, 10/24/22: She reports feeling stable, overall doing well, appetite okay, she drinks Premier protein, 2 bottles daily but not a whole lot of water, she drinks about 2 bottles of water per day, 16.9 ounce size.  She has not fallen, constipation is not an issue lately.  She takes her levodopa on a regular basis, 2 pills 4 times  a day at 8, 12, 4 PM and 8 PM daily.  She denies any side effects.  She helps take care of 4 of her great-grandchildren by staying at their homes respectively several times a week, these are her Daughter's grandchildren. She does not drive.   REVIEW OF SYSTEMS: Out of a complete 14 system review of symptoms, the patient complains only of the following symptoms, tremor, right shoulder pain, and all other reviewed systems are negative.   ALLERGIES: Allergies  Allergen Reactions   Amoxicillin Itching and Rash     HOME MEDICATIONS: Outpatient Medications Prior to Visit  Medication Sig Dispense Refill   acetaminophen (TYLENOL) 500 MG tablet Take 1,000 mg by mouth daily with breakfast.     Calcium Carbonate-Vitamin D (CALCIUM 500 + D) 500-125 MG-UNIT TABS Take by mouth.     carbidopa-levodopa (SINEMET IR) 25-100 MG tablet TAKE 2 TABLETS FOUR TIMES DAILY 720 tablet 3   cetirizine (ZYRTEC ALLERGY) 10 MG tablet Take 1 tablet (10 mg total) by mouth daily. 60 tablet 0   CRANBERRY PO Take by mouth.     estradiol (ESTRACE) 0.1 MG/GM vaginal cream Place 1 Applicatorful vaginally. Every other day     fesoterodine (TOVIAZ) 8 MG TB24 tablet Take 1 tablet (8 mg total) by mouth daily. 90 tablet 3   fluticasone (FLONASE) 50 MCG/ACT nasal spray Place 2 sprays into both nostrils daily. 15.8 mL 0   guaiFENesin (ROBITUSSIN) 100 MG/5ML liquid Take  5 mLs by mouth every 4 (four) hours as needed for cough or to loosen phlegm. 120 mL 1   ibuprofen (ADVIL) 200 MG tablet Take 400 mg by mouth at bedtime.     Lactobacillus (ACIDOPHILUS) 100 MG CAPS Take by mouth.     Omega-3 Fatty Acids (FISH OIL) 500 MG CAPS Take by mouth.     omeprazole (PRILOSEC) 40 MG capsule TAKE 1 CAPSULE EVERY DAY 90 capsule 3   OVER THE COUNTER MEDICATION Velbet El energy antler 250 mg     Probiotic Product (PROBIOTIC ADVANCED PO) Take by mouth.     sertraline (ZOLOFT) 25 MG tablet TAKE 2 TABLETS EVERY DAY 180 tablet 3   simvastatin (ZOCOR)  40 MG tablet TAKE 1 TABLET EVERY DAY 90 tablet 3   trimethoprim-polymyxin b (POLYTRIM) ophthalmic solution Apply 1 to 2 drops 4 times daily for 7 days 10 mL 0   vitamin B-12 (CYANOCOBALAMIN) 100 MCG tablet Take 100 mcg by mouth daily.     No facility-administered medications prior to visit.     PAST MEDICAL HISTORY: Past Medical History:  Diagnosis Date   Anxiety disorder    Mixed hyperlipidemia    Parkinson's disease (HCC)    Urinary incontinence    uses pessary     PAST SURGICAL HISTORY: Past Surgical History:  Procedure Laterality Date   BACK SURGERY     BREAST BIOPSY Right    KNEE SURGERY     VAGINAL HYSTERECTOMY       FAMILY HISTORY: Family History  Problem Relation Age of Onset   Colon cancer Father    Diabetes Mother    Heart attack Brother    Colon cancer Sister    Breast cancer Sister    Heart attack Brother    Thyroid disease Daughter      SOCIAL HISTORY: Social History   Socioeconomic History   Marital status: Divorced    Spouse name: Not on file   Number of children: 2   Years of education: Not on file   Highest education level: Not on file  Occupational History    Comment: retired  Tobacco Use   Smoking status: Never   Smokeless tobacco: Never  Vaping Use   Vaping status: Never Used  Substance and Sexual Activity   Alcohol use: Never   Drug use: Never   Sexual activity: Not Currently    Birth control/protection: Surgical    Comment: hyst  Other Topics Concern   Not on file  Social History Narrative   Lives at home alone   Right handed   Caffeine: occasional   Social Drivers of Health   Financial Resource Strain: Low Risk  (12/29/2023)   Overall Financial Resource Strain (CARDIA)    Difficulty of Paying Living Expenses: Not hard at all  Food Insecurity: No Food Insecurity (12/29/2023)   Hunger Vital Sign    Worried About Running Out of Food in the Last Year: Never true    Ran Out of Food in the Last Year: Never true   Transportation Needs: Unmet Transportation Needs (12/29/2023)   PRAPARE - Administrator, Civil Service (Medical): Yes    Lack of Transportation (Non-Medical): No  Physical Activity: Insufficiently Active (12/29/2023)   Exercise Vital Sign    Days of Exercise per Week: 7 days    Minutes of Exercise per Session: 20 min  Stress: No Stress Concern Present (12/29/2023)   [employer] of Occupational Health - Occupational Stress Questionnaire  Feeling of Stress : Not at all  Social Connections: Moderately Isolated (12/29/2023)   Social Connection and Isolation Panel [NHANES]    Frequency of Communication with Friends and Family: More than three times a week    Frequency of Social Gatherings with Friends and Family: More than three times a week    Attends Religious Services: 1 to 4 times per year    Active Member of [employer] or Organizations: No    Attends Banker Meetings: Never    Marital Status: Divorced  Catering manager Violence: Not At Risk (12/29/2023)   Humiliation, Afraid, Rape, and Kick questionnaire    Fear of Current or Ex-Partner: No    Emotionally Abused: No    Physically Abused: No    Sexually Abused: No     PHYSICAL EXAM  There were no vitals filed for this visit.  There is no height or weight on file to calculate BMI.  Generalized: Well developed, in no acute distress  Cardiology: normal rate and rhythm, no murmur auscultated  Respiratory: clear to auscultation bilaterally    Neurological examination  Mentation: Alert oriented to time, place, history taking. Follows all commands speech and language fluent Cranial nerve II-XII: Pupils were equal round reactive to light. Extraocular movements were full, visual field were full on confrontational test. Facial sensation and strength were normal. Uvula tongue midline. Head turning and shoulder shrug  were normal and symmetric. Motor: The motor testing reveals 5 over 5 strength of all 4 extremities.  Mild resting tremor of right UE noted. No obvious difficulty with finger or toe taps. No cogwheel rigidity  Sensory: Sensory testing is intact to soft touch on all 4 extremities. No evidence of extinction is noted.  Coordination: Cerebellar testing reveals good finger-nose-finger and heel-to-shin bilaterally.  Gait and station: Able to stand without assistance. Posture very slightly stooped. Gait is slightly short. Decreased arm swing noted, bilaterally. Tandem not attempted for safety.  Reflexes: Deep tendon reflexes are symmetric and normal bilaterally.    DIAGNOSTIC DATA (LABS, IMAGING, TESTING) - I reviewed patient records, labs, notes, testing and imaging myself where available.  Lab Results  Component Value Date   WBC 8.1 11/11/2023   HGB 10.0 (L) 11/11/2023   HCT 31.9 (L) 11/11/2023   MCV 83 11/11/2023   PLT 340 11/11/2023      Component Value Date/Time   NA 140 11/11/2023 1043   K 4.3 11/11/2023 1043   CL 102 11/11/2023 1043   CO2 23 11/11/2023 1043   GLUCOSE 102 (H) 11/11/2023 1043   BUN 25 11/11/2023 1043   CREATININE 0.54 (L) 11/11/2023 1043   CALCIUM 8.9 11/11/2023 1043   PROT 6.2 11/11/2023 1043   ALBUMIN 3.4 (L) 11/11/2023 1043   AST 9 11/11/2023 1043   ALT 3 11/11/2023 1043   ALKPHOS 116 11/11/2023 1043   BILITOT 0.3 11/11/2023 1043   GFRNONAA 70 12/07/2020 0812   GFRAA 80 12/07/2020 0812   Lab Results  Component Value Date   CHOL 114 11/11/2023   HDL 48 11/11/2023   LDLCALC 52 11/11/2023   TRIG 67 11/11/2023   CHOLHDL 2.4 11/11/2023   Lab Results  Component Value Date   HGBA1C 5.4 11/11/2023   Lab Results  Component Value Date   VITAMINB12 1,973 (H) 11/09/2021   Lab Results  Component Value Date   TSH 2.180 11/11/2023        No data to display  No data to display           ASSESSMENT AND PLAN  78 y.o. year old female  has a past medical history of Anxiety disorder, Mixed hyperlipidemia, Parkinson's disease (HCC),  and Urinary incontinence. here with    No diagnosis found.  Tyshawna Alarid continues to do well. We will continue generic Sinemet taking 2 tablet four times daily. Fall precautions advised. She will continue regular physical and mental activity. Healthy lifestyle habits encouraged. She will follow up with PCP as directed. She will return to see me in 6 months, sooner if needed. She verbalizes understanding and agreement with this plan.   No orders of the defined types were placed in this encounter.    No orders of the defined types were placed in this encounter.   I spent 30 minutes of face-to-face and non-face-to-face time with patient.  This included previsit chart review, lab review, study review, order entry, electronic health record documentation, patient education.   Shawnie Dapper, MSN, FNP-C 01/07/2024, 1:00 PM  [facility] [address] [place], [place] [address] [phone]

## 2024-01-07 NOTE — Patient Instructions (Incomplete)
Below is our plan:  We will continue carbidopa-levodopa 2 tablets four times daily. Please continue follow up with ortho and PT/OT for shoulder pain. I will get you in with Dr Frances Furbish in a few months to see if any adjustments in medicaitons would be helpful. Try to work on lifestyle habits. Try to reduce daytime naps.   We can consider adding a memory support agent in the future if you wish. Memantine and donepezil are most frequency used.   Please make sure you are staying well hydrated. I recommend 50-60 ounces daily. Well balanced diet and regular exercise encouraged. Consistent sleep schedule with 6-8 hours recommended.   Please continue follow up with care team as directed.   Follow up with Dr Frances Furbish in 3-4 months   You may receive a survey regarding today's visit. I encourage you to leave honest feed back as I do use this information to improve patient care. Thank you for seeing me today!   Management of Memory Problems   There are some general things you can do to help manage your memory problems.  Your memory may not in fact recover, but by using techniques and strategies you will be able to manage your memory difficulties better.   1)  Establish a routine. Try to establish and then stick to a regular routine.  By doing this, you will get used to what to expect and you will reduce the need to rely on your memory.  Also, try to do things at the same time of day, such as taking your medication or checking your calendar first thing in the morning. Think about think that you can do as a part of a regular routine and make a list.  Then enter them into a daily planner to remind you.  This will help you establish a routine.   2)  Organize your environment. Organize your environment so that it is uncluttered.  Decrease visual stimulation.  Place everyday items such as keys or cell phone in the same place every day (ie.  Basket next to front door) Use post it notes with a brief message to  yourself (ie. Turn off light, lock the door) Use labels to indicate where things go (ie. Which cupboards are for food, dishes, etc.) Keep a notepad and pen by the telephone to take messages   3)  Memory Aids A diary or journal/notebook/daily planner Making a list (shopping list, chore list, to do list that needs to be done) Using an alarm as a reminder (kitchen timer or cell phone alarm) Using cell phone to store information (Notes, Calendar, Reminders) Calendar/White board placed in a prominent position Post-it notes   In order for memory aids to be useful, you need to have good habits.  It's no good remembering to make a note in your journal if you don't remember to look in it.  Try setting aside a certain time of day to look in journal.   4)  Improving mood and managing fatigue. There may be other factors that contribute to memory difficulties.  Factors, such as anxiety, depression and tiredness can affect memory. Regular gentle exercise can help improve your mood and give you more energy. Exercise: there are short videos created by the [employer] on Health specially for older adults: https://bit.ly/2I30q97.  Mediterranean diet: which emphasizes fruits, vegetables, whole grains, legumes, fish, and other seafood; unsaturated fats such as olive oils; and low amounts of red meat, eggs, and sweets. A variation of this, called MIND (  Mediterranean-DASH Intervention for Neurodegenerative Delay) incorporates the DASH (Dietary Approaches to Stop Hypertension) diet, which has been shown to lower high blood pressure, a risk factor for Alzheimer's disease. More information at: ExitMarketing.de.  Aerobic exercise that improve heart health is also good for the mind.  [employer] on Aging have short videos for exercises that you can do at home: BlindWorkshop.com.pt Simple relaxation techniques may help relieve  symptoms of anxiety Try to get back to completing activities or hobbies you enjoyed doing in the past. Learn to pace yourself through activities to decrease fatigue. Find out about some local support groups where you can share experiences with others. Try and achieve 7-8 hours of sleep at night.   Tasks to improve attention/working memory 1. Good sleep hygiene (7-8 hrs of sleep) 2. Learning a new skill (Painting, Carpentry, Pottery, new language, Knitting). 3.Cognitive exercises (keep a daily journal, Puzzles) 4. Physical exercise and training  (30 min/day X 4 days week) 5. Being on Antidepressant if needed 6.Yoga, Meditation, Tai Chi 7. Decrease alcohol intake 8.Have a clear schedule and structure in daily routine   MIND Diet: The Mediterranean-DASH Diet Intervention for Neurodegenerative Delay, or MIND diet, targets the health of the aging brain. Research participants with the highest MIND diet scores had a significantly slower rate of cognitive decline compared with those with the lowest scores. The effects of the MIND diet on cognition showed greater effects than either the Mediterranean or the DASH diet alone.   The healthy items the MIND diet guidelines suggest include:   3+ servings a day of whole grains 1+ servings a day of vegetables (other than green leafy) 6+ servings a week of green leafy vegetables 5+ servings a week of nuts 4+ meals a week of beans 2+ servings a week of berries 2+ meals a week of poultry 1+ meals a week of fish Mainly olive oil if added fat is used   The unhealthy items, which are higher in saturated and trans fat, include: Less than 5 servings a week of pastries and sweets Less than 4 servings a week of red meat (including beef, pork, lamb, and products made from these meats) Less than one serving a week of cheese and fried foods Less than 1 tablespoon a day of butter/stick margarine

## 2024-01-08 ENCOUNTER — Ambulatory Visit: Payer: Medicare HMO | Admitting: Family Medicine

## 2024-01-08 ENCOUNTER — Encounter: Payer: Self-pay | Admitting: Family Medicine

## 2024-01-08 VITALS — BP 116/72 | HR 74 | Ht 63.0 in | Wt 126.0 lb

## 2024-01-08 DIAGNOSIS — G20A2 Parkinson's disease without dyskinesia, with fluctuations: Secondary | ICD-10-CM

## 2024-01-09 ENCOUNTER — Encounter: Payer: Self-pay | Admitting: Family Medicine

## 2024-01-12 ENCOUNTER — Telehealth: Payer: Self-pay | Admitting: Orthopedic Surgery

## 2024-01-12 DIAGNOSIS — M19011 Primary osteoarthritis, right shoulder: Secondary | ICD-10-CM

## 2024-01-12 DIAGNOSIS — G8929 Other chronic pain: Secondary | ICD-10-CM

## 2024-01-12 NOTE — Telephone Encounter (Signed)
Dr. Dallas Schimke   Patient daughter Cordelia Pen called and states she would like Home Health to come out the the house and do PT there. Her mother does not drive, and she works 6 days a week.   [employer] said they do that and it is covered.  There number is [phone]  fax # [phone].  If you could please check into this for her mother would be greatly appreciated

## 2024-01-14 ENCOUNTER — Ambulatory Visit (HOSPITAL_COMMUNITY): Payer: Medicare HMO | Admitting: Occupational Therapy

## 2024-01-14 NOTE — Telephone Encounter (Signed)
Order placed and sent for scheduling.

## 2024-01-16 ENCOUNTER — Ambulatory Visit: Payer: Medicare HMO | Admitting: Orthopedic Surgery

## 2024-01-16 ENCOUNTER — Encounter: Payer: Self-pay | Admitting: Orthopedic Surgery

## 2024-01-16 VITALS — BP 151/75 | HR 76 | Ht 63.0 in | Wt 126.0 lb

## 2024-01-16 DIAGNOSIS — M19011 Primary osteoarthritis, right shoulder: Secondary | ICD-10-CM | POA: Diagnosis not present

## 2024-01-16 DIAGNOSIS — M19012 Primary osteoarthritis, left shoulder: Secondary | ICD-10-CM

## 2024-01-16 NOTE — Progress Notes (Signed)
Orthopaedic Clinic Return  Assessment: Colleen Acevedo is a 78 y.o. female with the following: Right glenohumeral arthritis Left glenohumeral arthritis  Plan: [name] has glenohumeral joint arthritis in bilateral shoulders.  Right is worse than left based on x-rays.  Right is also more symptomatic.  She saw her physician for Parkinson's treatment, and she was told that she would not be cleared for surgery.  We will continue with symptomatic management.  Injections were completed today.  We have placed referrals for home health PT/OT.  Procedure note injection - Right shoulder    Verbal consent was obtained to inject the right shoulder, subacromial space Timeout was completed to confirm the site of injection.   The skin was prepped with alcohol and ethyl chloride was sprayed at the injection site.  A 21-gauge needle was used to inject 40 mg of Depo-Medrol and 1% lidocaine (4 cc) into the subacromial space of the right shoulder using a posterolateral approach.  There were no complications.  A sterile bandage was applied.    Procedure note injection Left shoulder    Verbal consent was obtained to inject the left shoulder, subacromial space Timeout was completed to confirm the site of injection.  The skin was prepped with alcohol and ethyl chloride was sprayed at the injection site.  A 21-gauge needle was used to inject 40 mg of Depo-Medrol and 1% lidocaine (4 cc) into the subacromial space of the left shoulder using a posterolateral approach.  There were no complications. A sterile bandage was applied.    Follow-up: Return if symptoms worsen or fail to improve.   Subjective:  Chief Complaint  Patient presents with   Shoulder Pain    Bilat shoulders R > L      History of Present Illness: [name] is a 78 y.o. female who returns to clinic for evaluation of bilateral shoulder pain.  She continues to have pain in both shoulders.  Right is currently worse than left.   She has known advanced glenohumeral joint arthritis.  Prior injections have helped with her pain and function overall.  Most recent injections were greater than 3 months ago.  She is interested in some home health therapy, and we are helping coordinate this.  She was evaluated by her physician who treats her for Parkinson's, and she was advised that surgery is not an option.   Review of Systems: No fevers or chills No numbness or tingling No chest pain No shortness of breath No bowel or bladder dysfunction No GI distress No headaches   Objective: BP (!) 151/75   Pulse 76   Ht 5\' 3"  (1.6 m)   Wt 126 lb (57.2 kg)   BMI 22.32 kg/m   Physical Exam:  Alert and oriented.  No acute distress.  Active motion intact in her right hand.  No deformity about the right shoulder.  2+ radial pulse.  Passive forward flexion to 90 degrees before it is uncomfortable.  External rotation is limited compared to the contralateral side.  No deformity of the left shoulder.  Sensation intact in the left hand.  Passive forward flexion to 120 degrees.  External rotation of approximately 30 degrees.  Is warm and well-perfused.   IMAGING: I personally ordered and reviewed the following images:  No new imaging obtained today.   Oliver Barre, MD 01/16/2024 11:47 AM

## 2024-01-16 NOTE — Patient Instructions (Signed)

## 2024-01-18 DIAGNOSIS — F324 Major depressive disorder, single episode, in partial remission: Secondary | ICD-10-CM | POA: Diagnosis not present

## 2024-01-18 DIAGNOSIS — G5793 Unspecified mononeuropathy of bilateral lower limbs: Secondary | ICD-10-CM | POA: Diagnosis not present

## 2024-01-18 DIAGNOSIS — M19012 Primary osteoarthritis, left shoulder: Secondary | ICD-10-CM | POA: Diagnosis not present

## 2024-01-18 DIAGNOSIS — M19011 Primary osteoarthritis, right shoulder: Secondary | ICD-10-CM | POA: Diagnosis not present

## 2024-01-18 DIAGNOSIS — E782 Mixed hyperlipidemia: Secondary | ICD-10-CM | POA: Diagnosis not present

## 2024-01-18 DIAGNOSIS — F419 Anxiety disorder, unspecified: Secondary | ICD-10-CM | POA: Diagnosis not present

## 2024-01-18 DIAGNOSIS — E559 Vitamin D deficiency, unspecified: Secondary | ICD-10-CM | POA: Diagnosis not present

## 2024-01-18 DIAGNOSIS — G20A2 Parkinson's disease without dyskinesia, with fluctuations: Secondary | ICD-10-CM | POA: Diagnosis not present

## 2024-01-18 DIAGNOSIS — N3281 Overactive bladder: Secondary | ICD-10-CM | POA: Diagnosis not present

## 2024-01-20 ENCOUNTER — Telehealth: Payer: Self-pay | Admitting: Orthopedic Surgery

## 2024-01-20 NOTE — Telephone Encounter (Signed)
 Verbal order called and given to [facility].

## 2024-01-20 NOTE — Telephone Encounter (Signed)
 Dr. Dallas Schimke pt - spoke w/Maria PT w/[facility] [phone], she is requesting verbal orders for [facility] PT 2w1 and 1w7.

## 2024-01-22 DIAGNOSIS — F419 Anxiety disorder, unspecified: Secondary | ICD-10-CM | POA: Diagnosis not present

## 2024-01-22 DIAGNOSIS — N3281 Overactive bladder: Secondary | ICD-10-CM | POA: Diagnosis not present

## 2024-01-22 DIAGNOSIS — E782 Mixed hyperlipidemia: Secondary | ICD-10-CM | POA: Diagnosis not present

## 2024-01-22 DIAGNOSIS — F324 Major depressive disorder, single episode, in partial remission: Secondary | ICD-10-CM | POA: Diagnosis not present

## 2024-01-22 DIAGNOSIS — M19012 Primary osteoarthritis, left shoulder: Secondary | ICD-10-CM | POA: Diagnosis not present

## 2024-01-22 DIAGNOSIS — G5793 Unspecified mononeuropathy of bilateral lower limbs: Secondary | ICD-10-CM | POA: Diagnosis not present

## 2024-01-22 DIAGNOSIS — M19011 Primary osteoarthritis, right shoulder: Secondary | ICD-10-CM | POA: Diagnosis not present

## 2024-01-22 DIAGNOSIS — G20A2 Parkinson's disease without dyskinesia, with fluctuations: Secondary | ICD-10-CM | POA: Diagnosis not present

## 2024-01-22 DIAGNOSIS — E559 Vitamin D deficiency, unspecified: Secondary | ICD-10-CM | POA: Diagnosis not present

## 2024-01-24 HISTORY — PX: OTHER SURGICAL HISTORY: SHX169

## 2024-01-28 DIAGNOSIS — E559 Vitamin D deficiency, unspecified: Secondary | ICD-10-CM | POA: Diagnosis not present

## 2024-01-28 DIAGNOSIS — F324 Major depressive disorder, single episode, in partial remission: Secondary | ICD-10-CM | POA: Diagnosis not present

## 2024-01-28 DIAGNOSIS — M19011 Primary osteoarthritis, right shoulder: Secondary | ICD-10-CM | POA: Diagnosis not present

## 2024-01-28 DIAGNOSIS — G5793 Unspecified mononeuropathy of bilateral lower limbs: Secondary | ICD-10-CM | POA: Diagnosis not present

## 2024-01-28 DIAGNOSIS — M19012 Primary osteoarthritis, left shoulder: Secondary | ICD-10-CM | POA: Diagnosis not present

## 2024-01-28 DIAGNOSIS — E782 Mixed hyperlipidemia: Secondary | ICD-10-CM | POA: Diagnosis not present

## 2024-01-28 DIAGNOSIS — N3281 Overactive bladder: Secondary | ICD-10-CM | POA: Diagnosis not present

## 2024-01-28 DIAGNOSIS — G20A2 Parkinson's disease without dyskinesia, with fluctuations: Secondary | ICD-10-CM | POA: Diagnosis not present

## 2024-01-28 DIAGNOSIS — F419 Anxiety disorder, unspecified: Secondary | ICD-10-CM | POA: Diagnosis not present

## 2024-02-02 ENCOUNTER — Telehealth: Payer: Self-pay

## 2024-02-02 NOTE — Telephone Encounter (Signed)
 [facility] left message on Friday 01/30/24 stating that she had tried to contact patient to setup OT Assessment and left message for patient to call her. Patient didn't return her call. Malorie is needing an order for OT Assessment for the work of 02/01/24.  Please call her at [phone].

## 2024-02-03 ENCOUNTER — Telehealth: Payer: Self-pay | Admitting: Orthopedic Surgery

## 2024-02-03 NOTE — Telephone Encounter (Signed)
 Dr. Dallas Schimke pt Colleen Acevedo OT w/[facility] [phone], lvm stating that she was not able to reach the pt w/several attempts to schedule her.  She needs an order to attempt her again this week.

## 2024-02-04 NOTE — Telephone Encounter (Signed)
Called and verbal order given. 

## 2024-02-05 ENCOUNTER — Telehealth: Payer: Self-pay | Admitting: Orthopedic Surgery

## 2024-02-05 DIAGNOSIS — N3281 Overactive bladder: Secondary | ICD-10-CM | POA: Diagnosis not present

## 2024-02-05 DIAGNOSIS — F419 Anxiety disorder, unspecified: Secondary | ICD-10-CM | POA: Diagnosis not present

## 2024-02-05 DIAGNOSIS — E559 Vitamin D deficiency, unspecified: Secondary | ICD-10-CM | POA: Diagnosis not present

## 2024-02-05 DIAGNOSIS — N390 Urinary tract infection, site not specified: Secondary | ICD-10-CM | POA: Diagnosis not present

## 2024-02-05 DIAGNOSIS — G20A2 Parkinson's disease without dyskinesia, with fluctuations: Secondary | ICD-10-CM | POA: Diagnosis not present

## 2024-02-05 DIAGNOSIS — M19011 Primary osteoarthritis, right shoulder: Secondary | ICD-10-CM | POA: Diagnosis not present

## 2024-02-05 DIAGNOSIS — G5793 Unspecified mononeuropathy of bilateral lower limbs: Secondary | ICD-10-CM | POA: Diagnosis not present

## 2024-02-05 DIAGNOSIS — E782 Mixed hyperlipidemia: Secondary | ICD-10-CM | POA: Diagnosis not present

## 2024-02-05 DIAGNOSIS — N952 Postmenopausal atrophic vaginitis: Secondary | ICD-10-CM | POA: Diagnosis not present

## 2024-02-05 DIAGNOSIS — F324 Major depressive disorder, single episode, in partial remission: Secondary | ICD-10-CM | POA: Diagnosis not present

## 2024-02-05 DIAGNOSIS — Z90710 Acquired absence of both cervix and uterus: Secondary | ICD-10-CM | POA: Diagnosis not present

## 2024-02-05 DIAGNOSIS — M19012 Primary osteoarthritis, left shoulder: Secondary | ICD-10-CM | POA: Diagnosis not present

## 2024-02-05 DIAGNOSIS — Z4689 Encounter for fitting and adjustment of other specified devices: Secondary | ICD-10-CM | POA: Diagnosis not present

## 2024-02-05 NOTE — Telephone Encounter (Signed)
 Dr. Dallas Schimke pt Colleen Acevedo [facility] OT w/[facility] [phone] lvm requesting HH OT 1w4.

## 2024-02-06 DIAGNOSIS — G20A2 Parkinson's disease without dyskinesia, with fluctuations: Secondary | ICD-10-CM | POA: Diagnosis not present

## 2024-02-06 DIAGNOSIS — M19012 Primary osteoarthritis, left shoulder: Secondary | ICD-10-CM | POA: Diagnosis not present

## 2024-02-06 DIAGNOSIS — E782 Mixed hyperlipidemia: Secondary | ICD-10-CM | POA: Diagnosis not present

## 2024-02-06 DIAGNOSIS — N3281 Overactive bladder: Secondary | ICD-10-CM | POA: Diagnosis not present

## 2024-02-06 DIAGNOSIS — M19011 Primary osteoarthritis, right shoulder: Secondary | ICD-10-CM | POA: Diagnosis not present

## 2024-02-06 DIAGNOSIS — G5793 Unspecified mononeuropathy of bilateral lower limbs: Secondary | ICD-10-CM | POA: Diagnosis not present

## 2024-02-06 DIAGNOSIS — F419 Anxiety disorder, unspecified: Secondary | ICD-10-CM | POA: Diagnosis not present

## 2024-02-06 DIAGNOSIS — F324 Major depressive disorder, single episode, in partial remission: Secondary | ICD-10-CM | POA: Diagnosis not present

## 2024-02-06 DIAGNOSIS — E559 Vitamin D deficiency, unspecified: Secondary | ICD-10-CM | POA: Diagnosis not present

## 2024-02-06 NOTE — Telephone Encounter (Signed)
 I called to give VO

## 2024-02-13 DIAGNOSIS — F419 Anxiety disorder, unspecified: Secondary | ICD-10-CM | POA: Diagnosis not present

## 2024-02-13 DIAGNOSIS — M19011 Primary osteoarthritis, right shoulder: Secondary | ICD-10-CM | POA: Diagnosis not present

## 2024-02-13 DIAGNOSIS — N3281 Overactive bladder: Secondary | ICD-10-CM | POA: Diagnosis not present

## 2024-02-13 DIAGNOSIS — E559 Vitamin D deficiency, unspecified: Secondary | ICD-10-CM | POA: Diagnosis not present

## 2024-02-13 DIAGNOSIS — M19012 Primary osteoarthritis, left shoulder: Secondary | ICD-10-CM | POA: Diagnosis not present

## 2024-02-13 DIAGNOSIS — E782 Mixed hyperlipidemia: Secondary | ICD-10-CM | POA: Diagnosis not present

## 2024-02-13 DIAGNOSIS — G5793 Unspecified mononeuropathy of bilateral lower limbs: Secondary | ICD-10-CM | POA: Diagnosis not present

## 2024-02-13 DIAGNOSIS — G20A2 Parkinson's disease without dyskinesia, with fluctuations: Secondary | ICD-10-CM | POA: Diagnosis not present

## 2024-02-13 DIAGNOSIS — F324 Major depressive disorder, single episode, in partial remission: Secondary | ICD-10-CM | POA: Diagnosis not present

## 2024-02-17 DIAGNOSIS — G20A2 Parkinson's disease without dyskinesia, with fluctuations: Secondary | ICD-10-CM | POA: Diagnosis not present

## 2024-02-17 DIAGNOSIS — M19012 Primary osteoarthritis, left shoulder: Secondary | ICD-10-CM | POA: Diagnosis not present

## 2024-02-17 DIAGNOSIS — F419 Anxiety disorder, unspecified: Secondary | ICD-10-CM | POA: Diagnosis not present

## 2024-02-17 DIAGNOSIS — N3281 Overactive bladder: Secondary | ICD-10-CM | POA: Diagnosis not present

## 2024-02-17 DIAGNOSIS — M19011 Primary osteoarthritis, right shoulder: Secondary | ICD-10-CM | POA: Diagnosis not present

## 2024-02-17 DIAGNOSIS — E782 Mixed hyperlipidemia: Secondary | ICD-10-CM | POA: Diagnosis not present

## 2024-02-17 DIAGNOSIS — G5793 Unspecified mononeuropathy of bilateral lower limbs: Secondary | ICD-10-CM | POA: Diagnosis not present

## 2024-02-17 DIAGNOSIS — E559 Vitamin D deficiency, unspecified: Secondary | ICD-10-CM | POA: Diagnosis not present

## 2024-02-17 DIAGNOSIS — F324 Major depressive disorder, single episode, in partial remission: Secondary | ICD-10-CM | POA: Diagnosis not present

## 2024-02-19 DIAGNOSIS — E559 Vitamin D deficiency, unspecified: Secondary | ICD-10-CM | POA: Diagnosis not present

## 2024-02-19 DIAGNOSIS — M19012 Primary osteoarthritis, left shoulder: Secondary | ICD-10-CM | POA: Diagnosis not present

## 2024-02-19 DIAGNOSIS — N3281 Overactive bladder: Secondary | ICD-10-CM | POA: Diagnosis not present

## 2024-02-19 DIAGNOSIS — M19011 Primary osteoarthritis, right shoulder: Secondary | ICD-10-CM | POA: Diagnosis not present

## 2024-02-19 DIAGNOSIS — F419 Anxiety disorder, unspecified: Secondary | ICD-10-CM | POA: Diagnosis not present

## 2024-02-19 DIAGNOSIS — F324 Major depressive disorder, single episode, in partial remission: Secondary | ICD-10-CM | POA: Diagnosis not present

## 2024-02-19 DIAGNOSIS — G5793 Unspecified mononeuropathy of bilateral lower limbs: Secondary | ICD-10-CM | POA: Diagnosis not present

## 2024-02-19 DIAGNOSIS — G20A2 Parkinson's disease without dyskinesia, with fluctuations: Secondary | ICD-10-CM | POA: Diagnosis not present

## 2024-02-19 DIAGNOSIS — E782 Mixed hyperlipidemia: Secondary | ICD-10-CM | POA: Diagnosis not present

## 2024-02-20 DIAGNOSIS — N3281 Overactive bladder: Secondary | ICD-10-CM | POA: Diagnosis not present

## 2024-02-20 DIAGNOSIS — M19011 Primary osteoarthritis, right shoulder: Secondary | ICD-10-CM | POA: Diagnosis not present

## 2024-02-20 DIAGNOSIS — M19012 Primary osteoarthritis, left shoulder: Secondary | ICD-10-CM | POA: Diagnosis not present

## 2024-02-20 DIAGNOSIS — F419 Anxiety disorder, unspecified: Secondary | ICD-10-CM | POA: Diagnosis not present

## 2024-02-20 DIAGNOSIS — G5793 Unspecified mononeuropathy of bilateral lower limbs: Secondary | ICD-10-CM | POA: Diagnosis not present

## 2024-02-20 DIAGNOSIS — F324 Major depressive disorder, single episode, in partial remission: Secondary | ICD-10-CM | POA: Diagnosis not present

## 2024-02-20 DIAGNOSIS — E559 Vitamin D deficiency, unspecified: Secondary | ICD-10-CM | POA: Diagnosis not present

## 2024-02-20 DIAGNOSIS — G20A2 Parkinson's disease without dyskinesia, with fluctuations: Secondary | ICD-10-CM | POA: Diagnosis not present

## 2024-02-20 DIAGNOSIS — E782 Mixed hyperlipidemia: Secondary | ICD-10-CM | POA: Diagnosis not present

## 2024-02-25 ENCOUNTER — Telehealth: Payer: Self-pay | Admitting: Orthopedic Surgery

## 2024-02-25 DIAGNOSIS — G20A2 Parkinson's disease without dyskinesia, with fluctuations: Secondary | ICD-10-CM | POA: Diagnosis not present

## 2024-02-25 DIAGNOSIS — F419 Anxiety disorder, unspecified: Secondary | ICD-10-CM | POA: Diagnosis not present

## 2024-02-25 DIAGNOSIS — F324 Major depressive disorder, single episode, in partial remission: Secondary | ICD-10-CM | POA: Diagnosis not present

## 2024-02-25 DIAGNOSIS — G5793 Unspecified mononeuropathy of bilateral lower limbs: Secondary | ICD-10-CM | POA: Diagnosis not present

## 2024-02-25 DIAGNOSIS — M19011 Primary osteoarthritis, right shoulder: Secondary | ICD-10-CM | POA: Diagnosis not present

## 2024-02-25 DIAGNOSIS — M19012 Primary osteoarthritis, left shoulder: Secondary | ICD-10-CM | POA: Diagnosis not present

## 2024-02-25 DIAGNOSIS — E782 Mixed hyperlipidemia: Secondary | ICD-10-CM | POA: Diagnosis not present

## 2024-02-25 DIAGNOSIS — E559 Vitamin D deficiency, unspecified: Secondary | ICD-10-CM | POA: Diagnosis not present

## 2024-02-25 DIAGNOSIS — N3281 Overactive bladder: Secondary | ICD-10-CM | POA: Diagnosis not present

## 2024-02-25 NOTE — Telephone Encounter (Signed)
 Dr. Dallas Schimke pt - spoke w/Mallory w/[facility] [phone], she is requesting a verbal order to extend OT 1w3

## 2024-02-25 NOTE — Telephone Encounter (Signed)
 I called with verbal orders to extend OT

## 2024-02-26 ENCOUNTER — Ambulatory Visit: Payer: Medicare HMO | Admitting: Family Medicine

## 2024-02-27 DIAGNOSIS — E559 Vitamin D deficiency, unspecified: Secondary | ICD-10-CM | POA: Diagnosis not present

## 2024-02-27 DIAGNOSIS — F419 Anxiety disorder, unspecified: Secondary | ICD-10-CM | POA: Diagnosis not present

## 2024-02-27 DIAGNOSIS — G5793 Unspecified mononeuropathy of bilateral lower limbs: Secondary | ICD-10-CM | POA: Diagnosis not present

## 2024-02-27 DIAGNOSIS — E782 Mixed hyperlipidemia: Secondary | ICD-10-CM | POA: Diagnosis not present

## 2024-02-27 DIAGNOSIS — M19011 Primary osteoarthritis, right shoulder: Secondary | ICD-10-CM | POA: Diagnosis not present

## 2024-02-27 DIAGNOSIS — F324 Major depressive disorder, single episode, in partial remission: Secondary | ICD-10-CM | POA: Diagnosis not present

## 2024-02-27 DIAGNOSIS — M19012 Primary osteoarthritis, left shoulder: Secondary | ICD-10-CM | POA: Diagnosis not present

## 2024-02-27 DIAGNOSIS — G20A2 Parkinson's disease without dyskinesia, with fluctuations: Secondary | ICD-10-CM | POA: Diagnosis not present

## 2024-02-27 DIAGNOSIS — N3281 Overactive bladder: Secondary | ICD-10-CM | POA: Diagnosis not present

## 2024-03-05 DIAGNOSIS — M19011 Primary osteoarthritis, right shoulder: Secondary | ICD-10-CM | POA: Diagnosis not present

## 2024-03-05 DIAGNOSIS — E782 Mixed hyperlipidemia: Secondary | ICD-10-CM | POA: Diagnosis not present

## 2024-03-05 DIAGNOSIS — F419 Anxiety disorder, unspecified: Secondary | ICD-10-CM | POA: Diagnosis not present

## 2024-03-05 DIAGNOSIS — M19012 Primary osteoarthritis, left shoulder: Secondary | ICD-10-CM | POA: Diagnosis not present

## 2024-03-05 DIAGNOSIS — F324 Major depressive disorder, single episode, in partial remission: Secondary | ICD-10-CM | POA: Diagnosis not present

## 2024-03-05 DIAGNOSIS — G20A2 Parkinson's disease without dyskinesia, with fluctuations: Secondary | ICD-10-CM | POA: Diagnosis not present

## 2024-03-05 DIAGNOSIS — N3281 Overactive bladder: Secondary | ICD-10-CM | POA: Diagnosis not present

## 2024-03-05 DIAGNOSIS — G5793 Unspecified mononeuropathy of bilateral lower limbs: Secondary | ICD-10-CM | POA: Diagnosis not present

## 2024-03-05 DIAGNOSIS — E559 Vitamin D deficiency, unspecified: Secondary | ICD-10-CM | POA: Diagnosis not present

## 2024-03-10 DIAGNOSIS — F324 Major depressive disorder, single episode, in partial remission: Secondary | ICD-10-CM | POA: Diagnosis not present

## 2024-03-10 DIAGNOSIS — G5793 Unspecified mononeuropathy of bilateral lower limbs: Secondary | ICD-10-CM | POA: Diagnosis not present

## 2024-03-10 DIAGNOSIS — F419 Anxiety disorder, unspecified: Secondary | ICD-10-CM | POA: Diagnosis not present

## 2024-03-10 DIAGNOSIS — G20A2 Parkinson's disease without dyskinesia, with fluctuations: Secondary | ICD-10-CM | POA: Diagnosis not present

## 2024-03-10 DIAGNOSIS — M19012 Primary osteoarthritis, left shoulder: Secondary | ICD-10-CM | POA: Diagnosis not present

## 2024-03-10 DIAGNOSIS — E782 Mixed hyperlipidemia: Secondary | ICD-10-CM | POA: Diagnosis not present

## 2024-03-10 DIAGNOSIS — E559 Vitamin D deficiency, unspecified: Secondary | ICD-10-CM | POA: Diagnosis not present

## 2024-03-10 DIAGNOSIS — M19011 Primary osteoarthritis, right shoulder: Secondary | ICD-10-CM | POA: Diagnosis not present

## 2024-03-10 DIAGNOSIS — N3281 Overactive bladder: Secondary | ICD-10-CM | POA: Diagnosis not present

## 2024-03-11 DIAGNOSIS — F324 Major depressive disorder, single episode, in partial remission: Secondary | ICD-10-CM | POA: Diagnosis not present

## 2024-03-11 DIAGNOSIS — E559 Vitamin D deficiency, unspecified: Secondary | ICD-10-CM | POA: Diagnosis not present

## 2024-03-11 DIAGNOSIS — M19011 Primary osteoarthritis, right shoulder: Secondary | ICD-10-CM | POA: Diagnosis not present

## 2024-03-11 DIAGNOSIS — N3281 Overactive bladder: Secondary | ICD-10-CM | POA: Diagnosis not present

## 2024-03-11 DIAGNOSIS — M19012 Primary osteoarthritis, left shoulder: Secondary | ICD-10-CM | POA: Diagnosis not present

## 2024-03-11 DIAGNOSIS — G5793 Unspecified mononeuropathy of bilateral lower limbs: Secondary | ICD-10-CM | POA: Diagnosis not present

## 2024-03-11 DIAGNOSIS — F419 Anxiety disorder, unspecified: Secondary | ICD-10-CM | POA: Diagnosis not present

## 2024-03-11 DIAGNOSIS — G20A2 Parkinson's disease without dyskinesia, with fluctuations: Secondary | ICD-10-CM | POA: Diagnosis not present

## 2024-03-11 DIAGNOSIS — E782 Mixed hyperlipidemia: Secondary | ICD-10-CM | POA: Diagnosis not present

## 2024-03-17 DIAGNOSIS — G20A2 Parkinson's disease without dyskinesia, with fluctuations: Secondary | ICD-10-CM | POA: Diagnosis not present

## 2024-03-17 DIAGNOSIS — M19012 Primary osteoarthritis, left shoulder: Secondary | ICD-10-CM | POA: Diagnosis not present

## 2024-03-17 DIAGNOSIS — F324 Major depressive disorder, single episode, in partial remission: Secondary | ICD-10-CM | POA: Diagnosis not present

## 2024-03-17 DIAGNOSIS — N3281 Overactive bladder: Secondary | ICD-10-CM | POA: Diagnosis not present

## 2024-03-17 DIAGNOSIS — E559 Vitamin D deficiency, unspecified: Secondary | ICD-10-CM | POA: Diagnosis not present

## 2024-03-17 DIAGNOSIS — E782 Mixed hyperlipidemia: Secondary | ICD-10-CM | POA: Diagnosis not present

## 2024-03-17 DIAGNOSIS — G5793 Unspecified mononeuropathy of bilateral lower limbs: Secondary | ICD-10-CM | POA: Diagnosis not present

## 2024-03-17 DIAGNOSIS — F419 Anxiety disorder, unspecified: Secondary | ICD-10-CM | POA: Diagnosis not present

## 2024-03-17 DIAGNOSIS — M19011 Primary osteoarthritis, right shoulder: Secondary | ICD-10-CM | POA: Diagnosis not present

## 2024-04-10 ENCOUNTER — Other Ambulatory Visit: Payer: Self-pay | Admitting: Urology

## 2024-04-10 DIAGNOSIS — R350 Frequency of micturition: Secondary | ICD-10-CM

## 2024-04-10 DIAGNOSIS — R3915 Urgency of urination: Secondary | ICD-10-CM

## 2024-04-10 DIAGNOSIS — N3281 Overactive bladder: Secondary | ICD-10-CM

## 2024-04-10 DIAGNOSIS — N3941 Urge incontinence: Secondary | ICD-10-CM

## 2024-04-10 DIAGNOSIS — R351 Nocturia: Secondary | ICD-10-CM

## 2024-04-12 ENCOUNTER — Telehealth: Payer: Self-pay

## 2024-04-12 NOTE — Telephone Encounter (Signed)
 Patient is made aware Rx has been sent to patient's pharmacy and follow up appt made. Patient voiced understanding.

## 2024-04-15 ENCOUNTER — Ambulatory Visit (INDEPENDENT_AMBULATORY_CARE_PROVIDER_SITE_OTHER): Payer: Medicare HMO | Admitting: Neurology

## 2024-04-15 ENCOUNTER — Encounter: Payer: Self-pay | Admitting: Neurology

## 2024-04-15 VITALS — BP 112/67 | HR 81 | Ht 62.0 in | Wt 127.0 lb

## 2024-04-15 DIAGNOSIS — G20A2 Parkinson's disease without dyskinesia, with fluctuations: Secondary | ICD-10-CM

## 2024-04-15 DIAGNOSIS — K5909 Other constipation: Secondary | ICD-10-CM | POA: Diagnosis not present

## 2024-04-15 DIAGNOSIS — R413 Other amnesia: Secondary | ICD-10-CM | POA: Diagnosis not present

## 2024-04-15 NOTE — Progress Notes (Signed)
 Subjective:    Patient ID: Fleeta Kunde is a 78 y.o. female.  HPI    Interim history:   Ms. Bettes is a 78 year old right-handed woman with an underlying medical history of hyperlipidemia, recurrent UTIs, left knee arthritis, and anxiety, who presents for follow-up consultation of her Parkinson's disease, complicated by chronic pain, chronic constipation, frailty, as well as memory impairment.  The patient is accompanied by her daughter today.    She was last seen in our clinic by Terrilyn Fick, NP in February 2025, at which time her MMSE was 21.  She had noticed changes in her mobility and worsening memory over the past 6 months.  She was encouraged to pursue physical therapy which was ordered through orthopedics.  She had suffered from recurrent UTIs.   Today, 04/15/2024: She reports feeling more or less stable, no recent falls thankfully.  She feels that her memory function is stable.  She does have intermittent constipation, she admits that she does not drink a lot of water.  She may drink less than 2 cups/day.  She lives alone, her daughter sees her regularly and lives next-door.  She does not always eat her lunch, may have a snack instead but does tend to have a good breakfast.  She does not drive.  Her daughter usually takes her grocery shopping.  She has significant issues with shoulder pain bilaterally, right more than left and has seen orthopedics on a regular basis, has received steroid injections, has also done home health PT and OT, mostly PT with limited success.  Not currently in therapy.  The patient's allergies, current medications, family history, past medical history, past social history, past surgical history and problem list were reviewed and updated as appropriate.    Previously:    01/08/2024 (Amy Lomax, NP): <<Santana returns for follow up for PD. She was last seen by me 04/2023 and doing well on carb-levo 2 tablets QID. Since, she reports doing fairly well. She does  feel that she is "slowing down." She feels gait is slower. She denies difficulty with imbalance or with falls. She does not note any improvement with symptoms after taking medication. She usually takes tablets at 8a,12p,4p, and 8p. She denies difficulty eating or swallowing. Appetite is decreased. Weight is stable. Family concerned she may be forgetting to eat.    She is having more trouble with short term memory. She doesn't remember where she placed items at home. She has trouble with processing information sometimes. She is able to complete ADLs independently. Usually able to take meds without assistance but her daughter tells me there have been a couple of times where her alarm has gone off to remind her to take meds but then she forgets before she can actually take them.    She is having trouble with recurrent UTIs. Has pessary. She is followed by GYN and urology. She doesn't sleep well. She usually goes to bed around 9:30p. She is up and down all night to urinate. She usually wakes up around 7:30. She naps most days. Sometimes several naps during the day. Mood seems stable on sertraline  50mg  daily. She is having significant bilateral shoulder pain. She is followed by ortho and starts PT/OT. PCP has been following closely as she has had difficulty staying hydrated.    She lives alone. Lives beside her daughter. She helps care for her grandchildren. She does not drive. >>  03/03/8118 (Amy Lomax, NP): <<Jerriann Dulac is a 78 y.o. female here today for follow up  for PD. She was last seen by Dr Omar Bibber 09/2022 and doing fairly well. She continues generic Sinemet  2 tablets QID and tolerates it well. She reports doing well. She presents, alone, today. She states tremor is stable. She may notice some worsening if she is nervous. Gait is stable. No falls. No assistive devices used. She reports sleeping well. She feels appetite is good. No trouble swallowing. She continues to babysit her grandchildren and  reports being active daily. She does not drive. >>  16/10/96 (SA): She reports feeling stable, overall doing well, appetite okay, she drinks Premier protein, 2 bottles daily but not a whole lot of water, she drinks about 2 bottles of water per day, 16.9 ounce size.  She has not fallen, constipation is not an issue lately.  She takes her levodopa  on a regular basis, 2 pills 4 times a day at 8, 12, 4 PM and 8 PM daily.  She denies any side effects.  She helps take care of 4 of her great-grandchildren by staying at their homes respectively several times a week, these are her Daughter's grandchildren. She does not drive.    I saw her on 04/24/2022, at which time she reported feeling fairly stable, as of March 2023 she had been divorced, as of mid May 2023 she was able to move back into her home.  Her husband got the car.  She saw orthopedics on 11/14/2021 for left knee arthritis.  She was encouraged to wean off of Lyrica .  She received a steroid injection into the left knee.  In mid February 2023 she sustained a nondisplaced medial ankle fracture and saw orthopedics again.  She was treated with a walking boot.         I saw her on 10/24/2021, at which time she reported feeling fairly stable, motorwise.  She had noticed decline in her short-term memory with a tendency to forget conversations or asked the same question again.  She was trying to write more things down.  She reported left knee swelling and pain.  She was advised to continue with Sinemet  2 pills 4 times daily.   I saw her on 04/11/2021, at which time she reported significant stress.  She had moved in with her daughter who lives next-door, as the patient had separated from her husband.  Her exam was quite stable.  She was advised to continue with generic Sinemet  2 pills 4 times a day.   I saw her on 09/05/2020, at which time she was fairly stable, had no recent falls.  She was advised to follow-up routinely in 6 months.     I saw her on  03/06/2020, at which time she was taking Sinemet  2 pills 4 times daily.  Her husband was worried about her unintentional weight loss.  This was ongoing over a few years.  She was advised to talk to her primary care physician about additional work-up for weight loss.  We talked about fall prevention.  She had fallen twice since her previous visit.  She was advised to hydrate better and to be proactive about constipation issues.  She was advised to continue with Sinemet  at the current dose.   I first met her on 11/04/2019, at which time she reported a diagnosis of Parkinson's disease since 2016.  She was on Sinemet , 2 pills in the morning, 3 midday and 2 in the evening.  She was advised change her regimen to 2 pills 4 times a day on a scheduled 4 hourly basis.  11/04/19: (She) was diagnosed with Parkinson's disease in 2016, while she was still residing in [place] .  She has moved to [place] .  She was followed by a neurologist.  I was able to review some of her neurology records from [facility] in [place], [place] .  I reviewed the office note from 03/17/2018, at which time the patient was deemed stable as far as her Parkinson's disease and was advised to continue with Sinemet  2 pills 3 times daily.  She was advised to increase it if needed to 2 pills 4 times a day.  I reviewed your office note from 08/11/2019, which you kindly included. She has been on Sinemet  since 2016. She had blood work through your office on 08/06/2019 and I reviewed the results, CBC with differential and platelets was unremarkable, CMP showed benign findings, lipid panel showed total cholesterol of 155, triglycerides 82, LDL 16. They moved at the end of June, she is originally from [place] , they have a daughter in [place]  and one in [place]  and they moved to be closer to her.  Daughter lives next door and they have 2 grandchildren from her and also great-grandchildren.  The patient is a non-smoker and does not utilize  alcohol and drinks caffeine and limitation, 1 cup of coffee in the morning typically.  She has not had any recent falls thankfully, she has had some intermittent issues with constipation, takes a probiotic and sometimes a laxative.  She admits that she does not drink a whole lot of water, estimates that she drinks about 1 bottle of water per day on average.  She is physically quite active, no issues with memory, mood is stable.  She has no family history of Parkinson's disease. Her symptoms started towards the end of 2015, may be in December with a right hand tremor and feeling nervous.  She has not had a brain scan such as CT or MRI.She has been followed by Dr. Agustin Host. She currently takes Sinemet  2 pills at 7 AM, 3 pills at 12 and 2 pills at 8 PM.  She does take the medication often with her meals.   Her Past Medical History Is Significant For: Past Medical History:  Diagnosis Date   Anxiety disorder    Mixed hyperlipidemia    Parkinson's disease (HCC)    Urinary incontinence    uses pessary    Her Past Surgical History Is Significant For: Past Surgical History:  Procedure Laterality Date   BACK SURGERY     BREAST BIOPSY Right    KNEE SURGERY     pessary removal  01/2024   VAGINAL HYSTERECTOMY      Her Family History Is Significant For: Family History  Problem Relation Age of Onset   Colon cancer Father    Diabetes Mother    Heart attack Brother    Colon cancer Sister    Breast cancer Sister    Heart attack Brother    Thyroid  disease Daughter     Her Social History Is Significant For: Social History   Socioeconomic History   Marital status: Divorced    Spouse name: Not on file   Number of children: 2   Years of education: Not on file   Highest education level: Not on file  Occupational History    Comment: retired  Tobacco Use   Smoking status: Never   Smokeless tobacco: Never  Vaping Use   Vaping status: Never Used  Substance and Sexual Activity    Alcohol  use: Never   Drug use: Never   Sexual activity: Not Currently    Birth control/protection: Surgical    Comment: hyst  Other Topics Concern   Not on file  Social History Narrative   Lives at home alone   Right handed   Caffeine: occasional   Social Drivers of Health   Financial Resource Strain: Low Risk  (12/29/2023)   Overall Financial Resource Strain (CARDIA)    Difficulty of Paying Living Expenses: Not hard at all  Food Insecurity: No Food Insecurity (12/29/2023)   Hunger Vital Sign    Worried About Running Out of Food in the Last Year: Never true    Ran Out of Food in the Last Year: Never true  Transportation Needs: Unmet Transportation Needs (12/29/2023)   PRAPARE - Administrator, Civil Service (Medical): Yes    Lack of Transportation (Non-Medical): No  Physical Activity: Insufficiently Active (12/29/2023)   Exercise Vital Sign    Days of Exercise per Week: 7 days    Minutes of Exercise per Session: 20 min  Stress: No Stress Concern Present (12/29/2023)   [employer] of Occupational Health - Occupational Stress Questionnaire    Feeling of Stress : Not at all  Social Connections: Moderately Isolated (12/29/2023)   Social Connection and Isolation Panel [NHANES]    Frequency of Communication with Friends and Family: More than three times a week    Frequency of Social Gatherings with Friends and Family: More than three times a week    Attends Religious Services: 1 to 4 times per year    Active Member of [employer] or Organizations: No    Attends Banker Meetings: Never    Marital Status: Divorced    Her Allergies Are:  Allergies  Allergen Reactions   Amoxicillin Itching and Rash  :   Her Current Medications Are:  Outpatient Encounter Medications as of 04/15/2024  Medication Sig   acetaminophen (TYLENOL) 500 MG tablet Take 1,000 mg by mouth as needed. Alternates with advil (Tylenol in AM, Advil in afternoon, Tylenol at night)   Calcium  Carbonate-Vitamin D  (CALCIUM 500 + D) 500-125 MG-UNIT TABS Take by mouth.   carbidopa -levodopa  (SINEMET  IR) 25-100 MG tablet TAKE 2 TABLETS FOUR TIMES DAILY   cetirizine  (ZYRTEC  ALLERGY) 10 MG tablet Take 1 tablet (10 mg total) by mouth daily.   CRANBERRY PO Take by mouth.   estradiol (ESTRACE) 0.1 MG/GM vaginal cream Place 1 Applicatorful vaginally. Every other day   fesoterodine  (TOVIAZ ) 8 MG TB24 tablet TAKE 1 TABLET EVERY DAY   fluticasone  (FLONASE ) 50 MCG/ACT nasal spray Place 2 sprays into both nostrils daily. (Patient taking differently: Place 2 sprays into both nostrils daily. As needed)   guaiFENesin  (ROBITUSSIN) 100 MG/5ML liquid Take 5 mLs by mouth every 4 (four) hours as needed for cough or to loosen phlegm.   ibuprofen (ADVIL) 200 MG tablet Take 400 mg by mouth as needed.   Omega-3 Fatty Acids (FISH OIL) 500 MG CAPS Take by mouth.   omeprazole  (PRILOSEC) 40 MG capsule TAKE 1 CAPSULE EVERY DAY   Probiotic Product (PROBIOTIC ADVANCED PO) Take by mouth.   sertraline  (ZOLOFT ) 25 MG tablet TAKE 2 TABLETS EVERY DAY   simvastatin  (ZOCOR ) 40 MG tablet TAKE 1 TABLET EVERY DAY   trimethoprim -polymyxin b  (POLYTRIM ) ophthalmic solution Apply 1 to 2 drops 4 times daily for 7 days   vitamin B-12 (CYANOCOBALAMIN) 100 MCG tablet Take 100 mcg by mouth daily.   [DISCONTINUED] Lactobacillus (ACIDOPHILUS)  100 MG CAPS Take by mouth. (Patient not taking: Reported on 04/15/2024)   [DISCONTINUED] OVER THE COUNTER MEDICATION Velbet El energy antler 250 mg (Patient not taking: Reported on 04/15/2024)   No facility-administered encounter medications on file as of 04/15/2024.  :  Review of Systems:  Out of a complete 14 point review of systems, all are reviewed and negative with the exception of these symptoms as listed below:   Review of Systems  Neurological:        Patient is here with her daughter for follow-up of PD and memory. Patient feels she has been doing fairly well. Patient denies any falls in  last 3 months. Patient feels her memory is about the same. Patient feels her eating is good. Daughter is concerned that she may get busy and not eat like she should or skip some meals due to naps. Patient had her pessary removed in March which daughter felt would help her frequent UTIs. Patient has trouble with shoulders, bone spurs, etc. She is followed by ortho, gets steroid shots, and is doing PT & OT which she doesn't find very helpful.    Objective:  Neurological Exam  Physical Exam Physical Examination:   Vitals:   04/15/24 0957  BP: 112/67  Pulse: 81    General Examination: The patient is a very pleasant 78 y.o. female in no acute distress. She appears frail.  Well-groomed.   HEENT: Normocephalic, atraumatic, pupils are round and reactive to light, extraocular tracking is mildly impaired, she wears corrective eyeglasses.  She has bilateral cataracts.  She has mild left ptosis and left pupil is a little larger than the right, but overall stable. She has mild to moderate facial masking mild nuchal rigidity is noted, no lip, neck or jaw tremor, airway examination reveals mild mouth dryness, tongue protrudes centrally and palate elevates symmetrically, she has mild hypophonia. No sialorrhea.   Chest: Clear to auscultation without wheezing, rhonchi or crackles noted.   Heart: S1+S2+0, regular and normal without murmurs, rubs or gallops noted.    Abdomen: Soft, non-tender and non-distended.   Extremities: There is no edema in the lower extremities.   Skin: Warm and dry without trophic changes noted.   Musculoskeletal: exam reveals significant decreased range of motion in both shoulders, right worse than left.  She also has pain with shoulder movement on the right more than left.  Symptoms neuro for the for currently here as an additional attachment and is not compatible?      Neurologically:  Mental status: The patient is awake, alert and oriented in all 4 spheres. Her immediate and  remote memory, attention, language skills and fund of knowledge are appropriate. There is no evidence of aphasia, agnosia, apraxia or anomia. Speech is clear with normal prosody and enunciation. Thought process is linear. Mood is normal and affect is normal.  Cranial nerves II - XII are as described above under HEENT exam.    (On 11/04/2019: On Archimedes spiral drawing she has no tremor with the right hand, fairly good spiral, left hand is insecure, handwriting is legible, not particularly tremulous, slightly on the smaller side.)   Motor exam: Overall, thin bulk, global strength of 4/5, mild intermittent right upper extremity tremor noted at rest, stable.  She has slight increase in tone with cogwheeling noted in the right upper extremity, otherwise good tone on the L. Fine motor skills with finger taps and foot agility are mild to moderately impaired on the right, slightly better on the left.  Sensory exam is intact to light touch.  Cerebellar testing shows no dysmetria or intention tremor. Gait, station and balance: She stands without major difficulty, posture is mildly stooped for age, left shoulder higher than right.  She walks with decreased stride length and decreased pace, decreased arm swing bilaterally, more so on the R.  Turns well, balance is fairly well preserved.  No walking aid.   Assessment and Plan:    In summary, Gaylene Moylan is a very pleasant 78 year old right-handed woman with an underlying medical history of hyperlipidemia, recurrent UTIs, left knee arthritis, shoulder arthritis, and anxiety, who presents for follow-up consultation of her Parkinson's disease, complicated by chronic pain, chronic constipation, frailty, as well as memory impairment. She has been on levodopa  therapy and is currently taking 2 pills 4 times a day on a 4 hourly schedule, starting at 8 AM.  Motor wise she has been fairly stable.  She has had cognitive decline.  Both the patient and her daughter  feel that the cognitive decline has been stable.  We talked about potentially utilizing a memory medication down the road but for now I would like for her to focus on good nutrition and better hydration with water.  She has a very good support system thankfully and no recent falls.  She no longer drives. She has had significant problems with both shoulders and sees orthopedics regularly, has received steroid injections.  She has had physical therapy at home.  We mutually agreed to keep her medication regimen the same for now.  She is advised to follow-up in this clinic routinely to see one of our nurse practitioners in 6 months, sooner if needed.  I answered all their questions today and the patient and her daughter were in agreement. I spent 40 minutes in total face-to-face time and in reviewing records during pre-charting, more than 50% of which was spent in counseling and coordination of care, reviewing test results, reviewing medications and treatment regimen and/or in discussing or reviewing the diagnosis of PD, the prognosis and treatment options. Pertinent laboratory and imaging test results that were available during this visit with the patient were reviewed by me and considered in my medical decision making (see chart for details).

## 2024-05-04 ENCOUNTER — Ambulatory Visit: Admitting: Orthopedic Surgery

## 2024-05-11 ENCOUNTER — Ambulatory Visit (INDEPENDENT_AMBULATORY_CARE_PROVIDER_SITE_OTHER): Admitting: Orthopedic Surgery

## 2024-05-11 DIAGNOSIS — M19012 Primary osteoarthritis, left shoulder: Secondary | ICD-10-CM | POA: Diagnosis not present

## 2024-05-11 DIAGNOSIS — M19011 Primary osteoarthritis, right shoulder: Secondary | ICD-10-CM | POA: Diagnosis not present

## 2024-05-11 DIAGNOSIS — G8929 Other chronic pain: Secondary | ICD-10-CM

## 2024-05-11 NOTE — Patient Instructions (Signed)

## 2024-05-12 NOTE — Progress Notes (Signed)
 Orthopaedic Clinic Return  Assessment: Colleen Acevedo is a 78 y.o. female with the following: Right glenohumeral arthritis Left glenohumeral arthritis  Plan: Colleen Acevedo has glenohumeral joint arthritis in bilateral shoulders.  Right shoulder is worse than left.  Previous injection helped, and she would like to proceed with repeat injections.  She is also interested in home health occupational therapy.  Procedure note injection - Right shoulder    Verbal consent was obtained to inject the right shoulder, subacromial space Timeout was completed to confirm the site of injection.   The skin was prepped with alcohol and ethyl chloride was sprayed at the injection site.  A 21-gauge needle was used to inject 40 mg of Depo-Medrol  and 1% lidocaine (4 cc) into the subacromial space of the right shoulder using a posterolateral approach.  There were no complications.  A sterile bandage was applied.    Procedure note injection Left shoulder    Verbal consent was obtained to inject the left shoulder, subacromial space Timeout was completed to confirm the site of injection.  The skin was prepped with alcohol and ethyl chloride was sprayed at the injection site.  A 21-gauge needle was used to inject 40 mg of Depo-Medrol  and 1% lidocaine (4 cc) into the subacromial space of the left shoulder using a posterolateral approach.  There were no complications. A sterile bandage was applied.    Follow-up: Return if symptoms worsen or fail to improve.   Subjective:  Chief Complaint  Patient presents with   Injections    Bilat shoulders   NDC: [address]-4    History of Present Illness: Colleen Acevedo is a 78 y.o. female who returns to clinic for evaluation of bilateral shoulder pain.  She continues to have pain in both shoulders.  Right continues to be worse than the left.  Prior injections have helped.  She would like to proceed with repeat injections.  She did have home health physical  therapy and Occupational Therapy.  She is interested in more occupational therapy.   Review of Systems: No fevers or chills No numbness or tingling No chest pain No shortness of breath No bowel or bladder dysfunction No GI distress No headaches   Objective: There were no vitals taken for this visit.  Physical Exam:  Alert and oriented.  No acute distress.  Active motion intact in her right hand.  No deformity about the right shoulder.  2+ radial pulse.  Passive forward flexion to 90 degrees before it is uncomfortable.  External rotation is limited compared to the contralateral side.  No deformity of the left shoulder.  Sensation intact in the left hand.  Passive forward flexion to 120 degrees.  External rotation of approximately 30 degrees.  Is warm and well-perfused.   IMAGING: I personally ordered and reviewed the following images:  No new imaging obtained today.   Colleen Frater, MD 05/12/2024 8:48 AM

## 2024-05-13 ENCOUNTER — Telehealth: Payer: Self-pay | Admitting: Orthopedic Surgery

## 2024-05-13 NOTE — Telephone Encounter (Signed)
 Dr. Ernesta Heading pt Lunda Salines w/[facility] [phone] ext [phone] lvm stating that they received a referral for this pt, they are going to have a nurse go out and do an assessment tomorrow.  If you have any questions you can call her.

## 2024-05-14 ENCOUNTER — Other Ambulatory Visit: Payer: Self-pay | Admitting: Family Medicine

## 2024-05-14 DIAGNOSIS — E785 Hyperlipidemia, unspecified: Secondary | ICD-10-CM

## 2024-05-14 DIAGNOSIS — F324 Major depressive disorder, single episode, in partial remission: Secondary | ICD-10-CM

## 2024-05-18 ENCOUNTER — Telehealth: Payer: Self-pay | Admitting: Orthopedic Surgery

## 2024-05-18 NOTE — Telephone Encounter (Signed)
 Colleen Acevedo PT w/[facility] [phone] lvm stating the pt was referred back to [facility] for some treatment of her shoulder, but she does not qualify for home therapy, she is not homebound.  She will need a referral for outpatient PT.  She stated that the pt is going out of town at the end of the month, but will be interested in starting PT when she's back and that she'll call when she's back.

## 2024-06-19 ENCOUNTER — Other Ambulatory Visit: Payer: Self-pay | Admitting: Urology

## 2024-06-19 DIAGNOSIS — N3941 Urge incontinence: Secondary | ICD-10-CM

## 2024-06-19 DIAGNOSIS — N3281 Overactive bladder: Secondary | ICD-10-CM

## 2024-06-19 DIAGNOSIS — R351 Nocturia: Secondary | ICD-10-CM

## 2024-06-19 DIAGNOSIS — R3915 Urgency of urination: Secondary | ICD-10-CM

## 2024-06-19 DIAGNOSIS — R350 Frequency of micturition: Secondary | ICD-10-CM

## 2024-07-14 ENCOUNTER — Ambulatory Visit: Admitting: Urology

## 2024-07-21 ENCOUNTER — Other Ambulatory Visit: Payer: Self-pay | Admitting: Internal Medicine

## 2024-07-21 DIAGNOSIS — K219 Gastro-esophageal reflux disease without esophagitis: Secondary | ICD-10-CM

## 2024-08-24 ENCOUNTER — Ambulatory Visit: Payer: Self-pay

## 2024-08-24 NOTE — Telephone Encounter (Signed)
 Daughter unable to transport pt until Friday, appt scheduled. Advised to call back if symptoms worsen or change and also provided with UC information in case she would like to take her there after work.   FYI Only or Action Required?: FYI only for provider.  Patient was last seen in primary care on 09/10/2023 by Terry Wilhelmena Lloyd Hilario, FNP.  Called Nurse Triage reporting Cough.  Symptoms began several weeks ago.  Interventions attempted: Rest, hydration, or home remedies.  Symptoms are: unchanged.  Triage Disposition: See Physician Within 24 Hours  Patient/caregiver understands and will follow disposition?: No, refuses disposition - unable to come earlier than Friday Reason for Disposition  SEVERE coughing spells (e.g., whooping sound after coughing, vomiting after coughing)  Answer Assessment - Initial Assessment Questions Dry, barking cough x 2 weeks. Daughter states coughing is almost constant.   1. ONSET: When did the cough begin?      2 weeks. Patient told daughter she is improving, but daughter states she has not seen any improvement.  2. SEVERITY: How bad is the cough today?      Severe at times per daughter  3. SPUTUM: Describe the color of your sputum (e.g., none, dry cough; clear, white, yellow, green)     Denies sputum  4. HEMOPTYSIS: Are you coughing up any blood? If Yes, ask: How much? (e.g., flecks, streaks, tablespoons, etc.)     Denies  5. DIFFICULTY BREATHING: Are you having difficulty breathing? If Yes, ask: How bad is it? (e.g., mild, moderate, severe)      Patient denies  6. FEVER: Do you have a fever? If Yes, ask: What is your temperature, how was it measured, and when did it start?     Denies  7. CARDIAC HISTORY: Do you have any history of heart disease? (e.g., heart attack, congestive heart failure)      Denies  8. LUNG HISTORY: Do you have any history of lung disease?  (e.g., pulmonary embolus, asthma, emphysema)      Denies  9. PE RISK FACTORS: Do you have a history of blood clots? (or: recent major surgery, recent prolonged travel, bedridden)     Denies  Protocols used: Cough - Acute Non-Productive-A-AH Copied from CRM #[id number]. Topic: Clinical - Medical Advice >> Aug 24, 2024 12:09 PM [place] B wrote: Reason for CRM: patients daughter called in, patient has a dry cough, no appt until January, daughter wants to be sure its not pneumonia

## 2024-08-24 NOTE — Telephone Encounter (Signed)
 Appt made.

## 2024-08-26 DIAGNOSIS — Z4689 Encounter for fitting and adjustment of other specified devices: Secondary | ICD-10-CM | POA: Diagnosis not present

## 2024-08-26 DIAGNOSIS — N3000 Acute cystitis without hematuria: Secondary | ICD-10-CM | POA: Diagnosis not present

## 2024-08-26 DIAGNOSIS — R410 Disorientation, unspecified: Secondary | ICD-10-CM | POA: Diagnosis not present

## 2024-08-27 ENCOUNTER — Encounter: Payer: Self-pay | Admitting: Family Medicine

## 2024-08-27 ENCOUNTER — Ambulatory Visit (INDEPENDENT_AMBULATORY_CARE_PROVIDER_SITE_OTHER): Admitting: Family Medicine

## 2024-08-27 VITALS — BP 114/75 | HR 87 | Temp 98.3°F | Ht 62.0 in | Wt 128.0 lb

## 2024-08-27 DIAGNOSIS — J069 Acute upper respiratory infection, unspecified: Secondary | ICD-10-CM

## 2024-08-27 MED ORDER — DOXYCYCLINE HYCLATE 100 MG PO TABS: 100.0000 mg | ORAL_TABLET | Freq: Two times a day (BID) | ORAL | 0 refills | Status: AC

## 2024-08-27 MED ORDER — BENZONATATE 100 MG PO CAPS: 100.0000 mg | ORAL_CAPSULE | Freq: Three times a day (TID) | ORAL | 0 refills | Status: AC | PRN

## 2024-08-27 MED ORDER — ALBUTEROL SULFATE HFA 108 (90 BASE) MCG/ACT IN AERS: 2.0000 | INHALATION_SPRAY | Freq: Four times a day (QID) | RESPIRATORY_TRACT | 0 refills | Status: AC | PRN

## 2024-08-27 NOTE — Progress Notes (Signed)
 Subjective:  Patient ID: Colleen Acevedo, female    DOB: June 02, 1946, 78 y.o.   MRN: [id number]  Patient Care Team: Edman Meade PEDLAR, FNP as PCP - General (Family Medicine)   Chief Complaint:  Cough   HPI: Colleen Acevedo is a 78 y.o. female presenting on 08/27/2024 for Cough   Colleen Acevedo is a 78 year old female who presents with a persistent cough.  She has been experiencing a persistent cough for the past two weeks. Initially, the cough was accompanied by a sore throat and headache, but these symptoms have resolved, leaving only the cough. The cough is described as 'big' and 'deep barking,' which worsens when lying down, leading her to sit up for relief. No fever, chills, shortness of breath, wheezing, or difficulty swallowing. She denies coughing up phlegm. She has not had similar symptoms in the past.  She has been using DayQuil, Nyquil, and Ricola menthol cough drops, with Ricola providing significant relief. Her medical history includes reflux, for which she has been taking omeprazole  for the past four years. She has not experienced any new symptoms related to her reflux. She mentions shoulder pain, which is unrelated to the cough.          Relevant past medical, surgical, family, and social history reviewed and updated as indicated.  Allergies and medications reviewed and updated. Data reviewed: Chart in Epic.   Past Medical History:  Diagnosis Date   Anxiety disorder    Mixed hyperlipidemia    Parkinson's disease (HCC)    Urinary incontinence    uses pessary    Past Surgical History:  Procedure Laterality Date   BACK SURGERY     BREAST BIOPSY Right    KNEE SURGERY     pessary removal  01/2024   VAGINAL HYSTERECTOMY      Social History   Socioeconomic History   Marital status: Divorced    Spouse name: Not on file   Number of children: 2   Years of education: Not on file   Highest education level: Not on file  Occupational History     Comment: retired  Tobacco Use   Smoking status: Never   Smokeless tobacco: Never  Vaping Use   Vaping status: Never Used  Substance and Sexual Activity   Alcohol use: Never   Drug use: Never   Sexual activity: Not Currently    Birth control/protection: Surgical    Comment: hyst  Other Topics Concern   Not on file  Social History Narrative   Lives at home alone   Right handed   Caffeine: occasional   Social Drivers of Health   Financial Resource Strain: Low Risk  (12/29/2023)   Overall Financial Resource Strain (CARDIA)    Difficulty of Paying Living Expenses: Not hard at all  Food Insecurity: No Food Insecurity (08/26/2024)   Received from [facility]   Hunger Vital Sign    Within the past 12 months, you worried that your food would run out before you got the money to buy more.: Never true    Within the past 12 months, the food you bought just didn't last and you didn't have money to get more.: Never true  Transportation Needs: No Transportation Needs (08/26/2024)   Received from [facility] - Transportation    Lack of Transportation (Medical): No    Lack of Transportation (Non-Medical): No  Physical Activity: Insufficiently Active (12/29/2023)   Exercise Vital Sign    Days  of Exercise per Week: 7 days    Minutes of Exercise per Session: 20 min  Stress: No Stress Concern Present (12/29/2023)   [employer] of Occupational Health - Occupational Stress Questionnaire    Feeling of Stress : Not at all  Social Connections: Moderately Isolated (12/29/2023)   Social Connection and Isolation Panel    Frequency of Communication with Friends and Family: More than three times a week    Frequency of Social Gatherings with Friends and Family: More than three times a week    Attends Religious Services: 1 to 4 times per year    Active Member of [employer] or Organizations: No    Attends Banker Meetings: Never    Marital Status: Divorced  Catering manager  Violence: Not At Risk (08/26/2024)   Received from [facility]   Humiliation, Afraid, Rape, and Kick questionnaire    Within the last year, have you been afraid of your partner or ex-partner?: No    Within the last year, have you been humiliated or emotionally abused in other ways by your partner or ex-partner?: No    Within the last year, have you been kicked, hit, slapped, or otherwise physically hurt by your partner or ex-partner?: No    Within the last year, have you been raped or forced to have any kind of sexual activity by your partner or ex-partner?: No    Outpatient Encounter Medications as of 08/27/2024  Medication Sig   acetaminophen (TYLENOL) 500 MG tablet Take 1,000 mg by mouth as needed. Alternates with advil (Tylenol in AM, Advil in afternoon, Tylenol at night)   albuterol (VENTOLIN HFA) 108 (90 Base) MCG/ACT inhaler Inhale 2 puffs into the lungs every 6 (six) hours as needed for wheezing or shortness of breath.   benzonatate  (TESSALON  PERLES) 100 MG capsule Take 1 capsule (100 mg total) by mouth 3 (three) times daily as needed.   Calcium Carbonate-Vitamin D  (CALCIUM 500 + D) 500-125 MG-UNIT TABS Take by mouth.   carbidopa -levodopa  (SINEMET  IR) 25-100 MG tablet TAKE 2 TABLETS FOUR TIMES DAILY   cetirizine  (ZYRTEC  ALLERGY) 10 MG tablet Take 1 tablet (10 mg total) by mouth daily.   CRANBERRY PO Take by mouth.   doxycycline  (VIBRA -TABS) 100 MG tablet Take 1 tablet (100 mg total) by mouth 2 (two) times daily for 10 days. 1 po bid   estradiol (ESTRACE) 0.1 MG/GM vaginal cream Place 1 Applicatorful vaginally. Every other day   fesoterodine  (TOVIAZ ) 8 MG TB24 tablet TAKE 1 TABLET EVERY DAY   fluticasone  (FLONASE ) 50 MCG/ACT nasal spray Place 2 sprays into both nostrils daily.   guaiFENesin  (ROBITUSSIN) 100 MG/5ML liquid Take 5 mLs by mouth every 4 (four) hours as needed for cough or to loosen phlegm.   ibuprofen (ADVIL) 200 MG tablet Take 400 mg by mouth as needed.   nitrofurantoin ,  macrocrystal-monohydrate, (MACROBID ) 100 MG capsule Take 100 mg by mouth 2 (two) times daily.   Omega-3 Fatty Acids (FISH OIL) 500 MG CAPS Take by mouth.   omeprazole  (PRILOSEC) 40 MG capsule TAKE 1 CAPSULE EVERY DAY   Probiotic Product (PROBIOTIC ADVANCED PO) Take by mouth.   sertraline  (ZOLOFT ) 25 MG tablet TAKE 2 TABLETS EVERY DAY   simvastatin  (ZOCOR ) 40 MG tablet TAKE 1 TABLET EVERY DAY   trimethoprim -polymyxin b  (POLYTRIM ) ophthalmic solution Apply 1 to 2 drops 4 times daily for 7 days   vitamin B-12 (CYANOCOBALAMIN) 100 MCG tablet Take 100 mcg by mouth daily.   No facility-administered  encounter medications on file as of 08/27/2024.    Allergies  Allergen Reactions   Amoxicillin Itching and Rash    Pertinent ROS per HPI, otherwise unremarkable      Objective:  BP 114/75   Pulse 87   Temp 98.3 F (36.8 C) (Temporal)   Ht 5' 2 (1.575 m)   Wt 128 lb (58.1 kg)   SpO2 96%   BMI 23.41 kg/m    Wt Readings from Last 3 Encounters:  08/27/24 128 lb (58.1 kg)  04/15/24 127 lb (57.6 kg)  01/16/24 126 lb (57.2 kg)    Physical Exam Vitals and nursing note reviewed.  Constitutional:      General: She is not in acute distress.    Appearance: Normal appearance. She is normal weight. She is not ill-appearing, toxic-appearing or diaphoretic.  HENT:     Head: Normocephalic and atraumatic.     Right Ear: Hearing, tympanic membrane, ear canal and external ear normal.     Left Ear: Hearing, tympanic membrane, ear canal and external ear normal.     Nose: Nose normal.     Mouth/Throat:     Mouth: Mucous membranes are moist.     Pharynx: Postnasal drip present.  Eyes:     Conjunctiva/sclera: Conjunctivae normal.     Pupils: Pupils are equal, round, and reactive to light.  Cardiovascular:     Rate and Rhythm: Normal rate and regular rhythm.  Pulmonary:     Effort: Pulmonary effort is normal.     Breath sounds: Normal breath sounds.  Abdominal:     General: Bowel sounds are  normal.     Palpations: Abdomen is soft.  Musculoskeletal:     Cervical back: Neck supple.  Lymphadenopathy:     Cervical: No cervical adenopathy.  Skin:    General: Skin is warm and dry.     Capillary Refill: Capillary refill takes less than 2 seconds.  Neurological:     General: No focal deficit present.     Mental Status: She is alert.  Psychiatric:        Mood and Affect: Mood normal.        Behavior: Behavior normal.        Judgment: Judgment normal.     Results for orders placed or performed in visit on 09/10/23  NuSwab Vaginitis Plus (VG+)   Collection Time: 09/10/23  4:40 PM  Result Value Ref Range   Atopobium vaginae Low - 0 Score   BVAB 2 Low - 0 Score   Megasphaera 1 Low - 0 Score   Candida albicans, NAA Negative Negative   Candida glabrata, NAA Negative Negative   Trich vag by NAA Negative Negative   Chlamydia trachomatis, NAA Negative Negative   Neisseria gonorrhoeae, NAA Negative Negative  Urinalysis   Collection Time: 09/11/23 11:30 AM  Result Value Ref Range   Specific Gravity, UA 1.010 1.005 - 1.030   pH, UA 6.0 5.0 - 7.5   Color, UA Yellow Yellow   Appearance Ur Clear Clear   Leukocytes,UA 2+ (A) Negative   Protein,UA Negative Negative/Trace   Glucose, UA Negative Negative   Ketones, UA Negative Negative   RBC, UA 1+ (A) Negative   Bilirubin, UA Negative Negative   Urobilinogen, Ur 0.2 0.2 - 1.0 mg/dL   Nitrite, UA Negative Negative       Pertinent labs & imaging results that were available during my care of the patient were reviewed by me and considered in my medical decision  making.  Assessment & Plan:  Tressy was seen today for cough.  Diagnoses and all orders for this visit:  URI with cough and congestion -     doxycycline  (VIBRA -TABS) 100 MG tablet; Take 1 tablet (100 mg total) by mouth 2 (two) times daily for 10 days. 1 po bid -     albuterol (VENTOLIN HFA) 108 (90 Base) MCG/ACT inhaler; Inhale 2 puffs into the lungs every 6 (six)  hours as needed for wheezing or shortness of breath. -     benzonatate  (TESSALON  PERLES) 100 MG capsule; Take 1 capsule (100 mg total) by mouth 3 (three) times daily as needed.     Upper respiratory infection with persistent cough Persistent cough for two weeks, initially with sore throat and headache. No fever, chills, or sputum production. Symptoms worsen when lying down, likely due to postnasal drainage. No wheezing or shortness of breath. Likely bacterial in nature as it has progressed beyond the viral stage. Not contagious as she is past the initial febrile stage and has been symptomatic for almost three weeks. - Prescribe doxycycline  to address bacterial infection. - Prescribe albuterol inhaler for use every 6 to 8 hours as needed for cough. - Prescribe benzonatate  for cough, to be taken three times daily as needed. - Advise continuation of current over-the-counter medications if needed. - Instruct to contact the clinic if symptoms worsen or new symptoms develop.  Gastroesophageal reflux disease (GERD) Long-standing GERD managed with omeprazole .          Continue all other maintenance medications.  Follow up plan: Return if symptoms worsen or fail to improve.   Continue healthy lifestyle choices, including diet (rich in fruits, vegetables, and lean proteins, and low in salt and simple carbohydrates) and exercise (at least 30 minutes of moderate physical activity daily).   The above assessment and management plan was discussed with the patient. The patient verbalized understanding of and has agreed to the management plan. Patient is aware to call the clinic if they develop any new symptoms or if symptoms persist or worsen. Patient is aware when to return to the clinic for a follow-up visit. Patient educated on when it is appropriate to go to the emergency department.   Rosaline Bruns, FNP-C [facility] [phone]

## 2024-09-23 DIAGNOSIS — M1990 Unspecified osteoarthritis, unspecified site: Secondary | ICD-10-CM | POA: Diagnosis not present

## 2024-09-23 DIAGNOSIS — K219 Gastro-esophageal reflux disease without esophagitis: Secondary | ICD-10-CM | POA: Diagnosis not present

## 2024-09-23 DIAGNOSIS — F325 Major depressive disorder, single episode, in full remission: Secondary | ICD-10-CM | POA: Diagnosis not present

## 2024-09-23 DIAGNOSIS — N3941 Urge incontinence: Secondary | ICD-10-CM | POA: Diagnosis not present

## 2024-09-23 DIAGNOSIS — E785 Hyperlipidemia, unspecified: Secondary | ICD-10-CM | POA: Diagnosis not present

## 2024-09-23 DIAGNOSIS — G20A1 Parkinson's disease without dyskinesia, without mention of fluctuations: Secondary | ICD-10-CM | POA: Diagnosis not present

## 2024-09-23 DIAGNOSIS — R600 Localized edema: Secondary | ICD-10-CM | POA: Diagnosis not present

## 2024-09-23 DIAGNOSIS — Z833 Family history of diabetes mellitus: Secondary | ICD-10-CM | POA: Diagnosis not present

## 2024-09-23 DIAGNOSIS — F419 Anxiety disorder, unspecified: Secondary | ICD-10-CM | POA: Diagnosis not present

## 2024-09-23 DIAGNOSIS — Z809 Family history of malignant neoplasm, unspecified: Secondary | ICD-10-CM | POA: Diagnosis not present

## 2024-09-23 DIAGNOSIS — N905 Atrophy of vulva: Secondary | ICD-10-CM | POA: Diagnosis not present

## 2024-10-11 ENCOUNTER — Other Ambulatory Visit: Payer: Self-pay | Admitting: Neurology

## 2024-10-27 NOTE — Progress Notes (Unsigned)
 No chief complaint on file.   HISTORY OF PRESENT ILLNESS:  10/27/24 ALL:  Colleen Acevedo returns for follow up for PD. She was last seen by Dr Buck 03/2024 and felt symptoms were stable. Carb-levo 2 tablets QID continued. Since,   04/15/2024 SA:  She reports feeling more or less stable, no recent falls thankfully.  She feels that her memory function is stable.  She does have intermittent constipation, she admits that she does not drink a lot of water.  She may drink less than 2 cups/day.  She lives alone, her daughter sees her regularly and lives next-door.  She does not always eat her lunch, may have a snack instead but does tend to have a good breakfast.  She does not drive.  Her daughter usually takes her grocery shopping.  She has significant issues with shoulder pain bilaterally, right more than left and has seen orthopedics on a regular basis, has received steroid injections, has also done home health PT and OT, mostly PT with limited success.  Not currently in therapy.   01/08/2024 ALL:  Colleen Acevedo returns for follow up for PD. She was last seen by me 04/2023 and doing well on carb-levo 2 tablets QID. Since, she reports doing fairly well. She does feel that she is slowing down. She feels gait is slower. She denies difficulty with imbalance or with falls. She does not note any improvement with symptoms after taking medication. She usually takes tablets at 8a,12p,4p, and 8p. She denies difficulty eating or swallowing. Appetite is decreased. Weight is stable. Family concerned she may be forgetting to eat.   She is having more trouble with short term memory. She doesn't remember where she placed items at home. She has trouble with processing information sometimes. She is able to complete ADLs independently. Usually able to take meds without assistance but her daughter tells me there have been a couple of times where her alarm has gone off to remind her to take meds but then she forgets before she can  actually take them.   She is having trouble with recurrent UTIs. Has pessary. She is followed by GYN and urology. She doesn't sleep well. She usually goes to bed around 9:30p. She is up and down all night to urinate. She usually wakes up around 7:30. She naps most days. Sometimes several naps during the day. Mood seems stable on sertraline  50mg  daily. She is having significant bilateral shoulder pain. She is followed by ortho and starts PT/OT. PCP has been following closely as she has had difficulty staying hydrated.   She lives alone. Lives beside her daughter. She helps care for her grandchildren. She does not drive.   05/07/2023 ALL:  Colleen Acevedo is a 78 y.o. female here today for follow up for PD. She was last seen by Dr Buck 09/2022 and doing fairly well. She continues generic Sinemet  2 tablets QID and tolerates it well. She reports doing well. She presents, alone, today. She states tremor is stable. She may notice some worsening if she is nervous. Gait is stable. No falls. No assistive devices used. She reports sleeping well. She feels appetite is good. No trouble swallowing. She continues to babysit her grandchildren and reports being active daily. She does not drive.   HISTORY (copied from Dr Obie previous note)  Colleen Acevedo is a 78 year old right-handed woman with an underlying medical history of hyperlipidemia, recurrent UTIs, left knee arthritis, and anxiety, who presents for follow-up consultation of her Parkinson's disease.  The  patient is accompanied by her son-in-law, Oneil, today. I last saw her on 04/24/2022, at which time she reported feeling fairly stable, as of March 2023 she had been divorced, as of mid May 2023 she was able to move back into her home.  Her husband got the car.  She saw orthopedics on 11/14/2021 for left knee arthritis.  She was encouraged to wean off of Lyrica .  She received a steroid injection into the left knee.  In mid February 2023 she sustained a  nondisplaced medial ankle fracture and saw orthopedics again.  She was treated with a walking boot.     Today, 10/24/22: She reports feeling stable, overall doing well, appetite okay, she drinks Premier protein, 2 bottles daily but not a whole lot of water, she drinks about 2 bottles of water per day, 16.9 ounce size.  She has not fallen, constipation is not an issue lately.  She takes her levodopa  on a regular basis, 2 pills 4 times a day at 8, 12, 4 PM and 8 PM daily.  She denies any side effects.  She helps take care of 4 of her great-grandchildren by staying at their homes respectively several times a week, these are her Daughter's grandchildren. She does not drive.   REVIEW OF SYSTEMS: Out of a complete 14 system review of symptoms, the patient complains only of the following symptoms, tremor, bilateral shoulder pain, and all other reviewed systems are negative.   ALLERGIES: Allergies  Allergen Reactions   Amoxicillin Itching and Rash     HOME MEDICATIONS: Outpatient Medications Prior to Visit  Medication Sig Dispense Refill   acetaminophen (TYLENOL) 500 MG tablet Take 1,000 mg by mouth as needed. Alternates with advil (Tylenol in AM, Advil in afternoon, Tylenol at night)     albuterol  (VENTOLIN  HFA) 108 (90 Base) MCG/ACT inhaler Inhale 2 puffs into the lungs every 6 (six) hours as needed for wheezing or shortness of breath. 8 g 0   benzonatate  (TESSALON  PERLES) 100 MG capsule Take 1 capsule (100 mg total) by mouth 3 (three) times daily as needed. 20 capsule 0   Calcium Carbonate-Vitamin D  (CALCIUM 500 + D) 500-125 MG-UNIT TABS Take by mouth.     carbidopa -levodopa  (SINEMET  IR) 25-100 MG tablet TAKE 2 TABLETS FOUR TIMES DAILY 720 tablet 3   cetirizine  (ZYRTEC  ALLERGY) 10 MG tablet Take 1 tablet (10 mg total) by mouth daily. 60 tablet 0   CRANBERRY PO Take by mouth.     estradiol (ESTRACE) 0.1 MG/GM vaginal cream Place 1 Applicatorful vaginally. Every other day     fesoterodine  (TOVIAZ )  8 MG TB24 tablet TAKE 1 TABLET EVERY DAY 90 tablet 3   fluticasone  (FLONASE ) 50 MCG/ACT nasal spray Place 2 sprays into both nostrils daily. 15.8 mL 0   guaiFENesin  (ROBITUSSIN) 100 MG/5ML liquid Take 5 mLs by mouth every 4 (four) hours as needed for cough or to loosen phlegm. 120 mL 1   ibuprofen (ADVIL) 200 MG tablet Take 400 mg by mouth as needed.     Omega-3 Fatty Acids (FISH OIL) 500 MG CAPS Take by mouth.     omeprazole  (PRILOSEC) 40 MG capsule TAKE 1 CAPSULE EVERY DAY 90 capsule 3   Probiotic Product (PROBIOTIC ADVANCED PO) Take by mouth.     sertraline  (ZOLOFT ) 25 MG tablet TAKE 2 TABLETS EVERY DAY 180 tablet 3   simvastatin  (ZOCOR ) 40 MG tablet TAKE 1 TABLET EVERY DAY 90 tablet 3   trimethoprim -polymyxin b  (POLYTRIM ) ophthalmic solution Apply 1  to 2 drops 4 times daily for 7 days 10 mL 0   vitamin B-12 (CYANOCOBALAMIN) 100 MCG tablet Take 100 mcg by mouth daily.     No facility-administered medications prior to visit.     PAST MEDICAL HISTORY: Past Medical History:  Diagnosis Date   Anxiety disorder    Mixed hyperlipidemia    Parkinson's disease (HCC)    Urinary incontinence    uses pessary     PAST SURGICAL HISTORY: Past Surgical History:  Procedure Laterality Date   BACK SURGERY     BREAST BIOPSY Right    KNEE SURGERY     pessary removal  01/2024   VAGINAL HYSTERECTOMY       FAMILY HISTORY: Family History  Problem Relation Age of Onset   Colon cancer Father    Diabetes Mother    Heart attack Brother    Colon cancer Sister    Breast cancer Sister    Heart attack Brother    Thyroid  disease Daughter      SOCIAL HISTORY: Social History   Socioeconomic History   Marital status: Divorced    Spouse name: Not on file   Number of children: 2   Years of education: Not on file   Highest education level: Not on file  Occupational History    Comment: retired  Tobacco Use   Smoking status: Never   Smokeless tobacco: Never  Vaping Use   Vaping status:  Never Used  Substance and Sexual Activity   Alcohol use: Never   Drug use: Never   Sexual activity: Not Currently    Birth control/protection: Surgical    Comment: hyst  Other Topics Concern   Not on file  Social History Narrative   Lives at home alone   Right handed   Caffeine: occasional   Social Drivers of Health   Financial Resource Strain: Low Risk  (12/29/2023)   Overall Financial Resource Strain (CARDIA)    Difficulty of Paying Living Expenses: Not hard at all  Food Insecurity: No Food Insecurity (08/26/2024)   Received from [facility]   Hunger Vital Sign    Within the past 12 months, you worried that your food would run out before you got the money to buy more.: Never true    Within the past 12 months, the food you bought just didn't last and you didn't have money to get more.: Never true  Transportation Needs: No Transportation Needs (08/26/2024)   Received from [facility] - Transportation    Lack of Transportation (Medical): No    Lack of Transportation (Non-Medical): No  Physical Activity: Insufficiently Active (12/29/2023)   Exercise Vital Sign    Days of Exercise per Week: 7 days    Minutes of Exercise per Session: 20 min  Stress: No Stress Concern Present (12/29/2023)   [employer] - Occupational Stress Questionnaire    Feeling of Stress : Not at all  Social Connections: Moderately Isolated (12/29/2023)   Social Connection and Isolation Panel    Frequency of Communication with Friends and Family: More than three times a week    Frequency of Social Gatherings with Friends and Family: More than three times a week    Attends Religious Services: 1 to 4 times per year    Active Member of [employer] or Organizations: No    Attends Banker Meetings: Never    Marital Status: Divorced  Catering Manager Violence: Not At Risk (08/26/2024)  Received from [facility]   Humiliation, Afraid, Rape, and Kick questionnaire     Within the last year, have you been afraid of your partner or ex-partner?: No    Within the last year, have you been humiliated or emotionally abused in other ways by your partner or ex-partner?: No    Within the last year, have you been kicked, hit, slapped, or otherwise physically hurt by your partner or ex-partner?: No    Within the last year, have you been raped or forced to have any kind of sexual activity by your partner or ex-partner?: No     PHYSICAL EXAM  There were no vitals filed for this visit.   There is no height or weight on file to calculate BMI.  Generalized: Well developed, in no acute distress  Cardiology: normal rate and rhythm, no murmur auscultated  Respiratory: clear to auscultation bilaterally    Neurological examination  Mentation: Alert oriented to time, place, history taking. Follows all commands speech and language fluent Cranial nerve II-XII: Pupils were equal round reactive to light. Extraocular movements were full, visual field were full on confrontational test. Facial sensation and strength were normal. Uvula tongue midline. Head turning and shoulder shrug  were normal and symmetric. Motor: The motor testing reveals 5 over 5 strength of bilateral lower extremities. 4/5 bilateral upper ext, pateint reports significant shoulder pain. Mild resting tremor of right UE noted. No obvious difficulty with finger or toe taps. No cogwheel rigidity  Sensory: Sensory testing is intact to soft touch on all 4 extremities. No evidence of extinction is noted.  Coordination: Cerebellar testing reveals good finger-nose-finger and heel-to-shin bilaterally.  Gait and station: Able to stand without assistance. Posture very slightly stooped. Gait is slightly short. Decreased arm swing noted, bilaterally. Tandem not attempted for safety.  Reflexes: Deep tendon reflexes are symmetric and normal bilaterally.    DIAGNOSTIC DATA (LABS, IMAGING, TESTING) - I reviewed patient  records, labs, notes, testing and imaging myself where available.  Lab Results  Component Value Date   WBC 8.1 11/11/2023   HGB 10.0 (L) 11/11/2023   HCT 31.9 (L) 11/11/2023   MCV 83 11/11/2023   PLT 340 11/11/2023      Component Value Date/Time   NA 140 11/11/2023 1043   K 4.3 11/11/2023 1043   CL 102 11/11/2023 1043   CO2 23 11/11/2023 1043   GLUCOSE 102 (H) 11/11/2023 1043   BUN 25 11/11/2023 1043   CREATININE 0.54 (L) 11/11/2023 1043   CALCIUM 8.9 11/11/2023 1043   PROT 6.2 11/11/2023 1043   ALBUMIN 3.4 (L) 11/11/2023 1043   AST 9 11/11/2023 1043   ALT 3 11/11/2023 1043   ALKPHOS 116 11/11/2023 1043   BILITOT 0.3 11/11/2023 1043   GFRNONAA 70 12/07/2020 0812   GFRAA 80 12/07/2020 0812   Lab Results  Component Value Date   CHOL 114 11/11/2023   HDL 48 11/11/2023   LDLCALC 52 11/11/2023   TRIG 67 11/11/2023   CHOLHDL 2.4 11/11/2023   Lab Results  Component Value Date   HGBA1C 5.4 11/11/2023   Lab Results  Component Value Date   VITAMINB12 1,973 (H) 11/09/2021   Lab Results  Component Value Date   TSH 2.180 11/11/2023        No data to display              01/08/2024   10:09 AM  Montreal Cognitive Assessment   Visuospatial/ Executive (0/5) 3  Naming (0/3) 2  Attention: Read list of digits (0/2) 2  Attention: Read list of letters (0/1) 1  Attention: Serial 7 subtraction starting at 100 (0/3) 2  Language: Repeat phrase (0/2) 1  Language : Fluency (0/1) 1  Abstraction (0/2) 2  Delayed Recall (0/5) 1  Orientation (0/6) 6  Total 21     ASSESSMENT AND PLAN  78 y.o. year old female  has a past medical history of Anxiety disorder, Mixed hyperlipidemia, Parkinson's disease (HCC), and Urinary incontinence. here with    No diagnosis found.  Jen Benedict notes significant changes in memory and mobility over the past 6 months. She reports moving much slower than normal and tiring easier. We will continue generic Sinemet  taking 2 tablet four  times daily, for now. I have encouraged close follow up with ortho for evaluation and management of shoulder pain. Ortho has ordered PT. I have encouraged her to schedule asap. I have encouraged her to focus on staying well hydrated, eating healthy, well balanced meals with attention on protein intake. Sleep hygiene reviewed and I advised her to limit daytime naps. May consider melatonin if needed. She was advised to continue close follow up with care team to manage recurrent UTIs. Consider removal of pessary, if needed. Fall precautions advised. I have encouraged her to continue regular physical and mental activity. Healthy lifestyle habits encouraged. She will follow up with PCP as directed. She will return to see Dr Buck in 3 months, sooner if needed. She verbalizes understanding and agreement with this plan.   No orders of the defined types were placed in this encounter.    No orders of the defined types were placed in this encounter.   I spent 30 minutes of face-to-face and non-face-to-face time with patient.  This included previsit chart review, lab review, study review, order entry, electronic health record documentation, patient education.   Greig Forbes, MSN, FNP-C 10/27/2024, 8:33 AM  [facility] [address], [address] [place], [place] [address] [phone]

## 2024-10-27 NOTE — Patient Instructions (Signed)

## 2024-10-28 ENCOUNTER — Ambulatory Visit: Admitting: Family Medicine

## 2024-10-28 ENCOUNTER — Encounter: Payer: Self-pay | Admitting: Family Medicine

## 2024-10-28 VITALS — BP 156/79 | Ht 61.0 in | Wt 131.0 lb

## 2024-10-28 DIAGNOSIS — G20A2 Parkinson's disease without dyskinesia, with fluctuations: Secondary | ICD-10-CM | POA: Diagnosis not present

## 2024-11-09 ENCOUNTER — Telehealth: Payer: Self-pay | Admitting: Family Medicine

## 2024-11-09 NOTE — Telephone Encounter (Signed)
 Patient's daughter, called to schedule 6 month follow up with Dr. Buck.

## 2024-12-29 ENCOUNTER — Ambulatory Visit: Payer: Medicare HMO

## 2025-02-03 ENCOUNTER — Encounter: Payer: Self-pay | Admitting: Family Medicine

## 2025-02-11 ENCOUNTER — Ambulatory Visit

## 2025-05-16 ENCOUNTER — Encounter

## 2025-05-26 ENCOUNTER — Ambulatory Visit: Admitting: Neurology
